# Patient Record
Sex: Female | Born: 1935
Health system: Southern US, Community
[De-identification: ages and names within clinical notes are randomized; demographics above are authoritative.]

## PROBLEM LIST (undated history)

## (undated) DIAGNOSIS — I499 Cardiac arrhythmia, unspecified: Secondary | ICD-10-CM

## (undated) DIAGNOSIS — J309 Allergic rhinitis, unspecified: Secondary | ICD-10-CM

## (undated) DIAGNOSIS — K224 Dyskinesia of esophagus: Secondary | ICD-10-CM

## (undated) DIAGNOSIS — K219 Gastro-esophageal reflux disease without esophagitis: Secondary | ICD-10-CM

## (undated) DIAGNOSIS — R609 Edema, unspecified: Secondary | ICD-10-CM

## (undated) DIAGNOSIS — K635 Polyp of colon: Secondary | ICD-10-CM

## (undated) DIAGNOSIS — K449 Diaphragmatic hernia without obstruction or gangrene: Secondary | ICD-10-CM

## (undated) DIAGNOSIS — M1990 Unspecified osteoarthritis, unspecified site: Secondary | ICD-10-CM

## (undated) DIAGNOSIS — I10 Essential (primary) hypertension: Secondary | ICD-10-CM

## (undated) DIAGNOSIS — I509 Heart failure, unspecified: Secondary | ICD-10-CM

## (undated) HISTORY — DX: Heart failure, unspecified: I50.9

## (undated) HISTORY — PX: HYSTEROTOMY: SHX1776

## (undated) HISTORY — PX: TONSILLECTOMY: SUR1361

## (undated) HISTORY — DX: Unspecified osteoarthritis, unspecified site: M19.90

## (undated) HISTORY — DX: Cardiac arrhythmia, unspecified: I49.9

## (undated) HISTORY — PX: OTHER SURGICAL HISTORY: SHX169

## (undated) HISTORY — PX: APPENDECTOMY: SHX54

## (undated) HISTORY — PX: EYE SURGERY: SHX253

## (undated) HISTORY — PX: OOPHORECTOMY: SHX86

## (undated) HISTORY — PX: CATARACT EXTRACTION: SUR2

## (undated) HISTORY — DX: Essential (primary) hypertension: I10

## (undated) HISTORY — DX: Diaphragmatic hernia without obstruction or gangrene: K44.9

---

## 2004-07-11 ENCOUNTER — Ambulatory Visit: Payer: Self-pay | Admitting: Internal Medicine

## 2004-09-03 ENCOUNTER — Ambulatory Visit: Payer: Self-pay | Admitting: Unknown Physician Specialty

## 2005-04-07 ENCOUNTER — Emergency Department: Payer: Self-pay | Admitting: General Practice

## 2005-07-16 ENCOUNTER — Ambulatory Visit: Payer: Self-pay | Admitting: Internal Medicine

## 2005-10-27 ENCOUNTER — Ambulatory Visit: Payer: Self-pay | Admitting: Unknown Physician Specialty

## 2006-05-13 ENCOUNTER — Ambulatory Visit: Payer: Self-pay | Admitting: Ophthalmology

## 2006-05-20 ENCOUNTER — Ambulatory Visit: Payer: Self-pay | Admitting: Ophthalmology

## 2006-07-15 ENCOUNTER — Ambulatory Visit: Payer: Self-pay | Admitting: General Practice

## 2006-08-04 ENCOUNTER — Ambulatory Visit: Payer: Self-pay | Admitting: Internal Medicine

## 2006-08-12 ENCOUNTER — Ambulatory Visit: Payer: Self-pay | Admitting: General Practice

## 2006-08-26 ENCOUNTER — Ambulatory Visit: Payer: Self-pay | Admitting: General Practice

## 2007-02-10 ENCOUNTER — Ambulatory Visit: Payer: Self-pay | Admitting: Ophthalmology

## 2007-02-17 ENCOUNTER — Ambulatory Visit: Payer: Self-pay | Admitting: Ophthalmology

## 2007-09-01 ENCOUNTER — Ambulatory Visit: Payer: Self-pay | Admitting: Internal Medicine

## 2008-09-05 ENCOUNTER — Ambulatory Visit: Payer: Self-pay | Admitting: Internal Medicine

## 2009-01-22 ENCOUNTER — Inpatient Hospital Stay: Payer: Self-pay | Admitting: Internal Medicine

## 2009-03-05 ENCOUNTER — Ambulatory Visit: Payer: Self-pay | Admitting: Internal Medicine

## 2009-09-10 ENCOUNTER — Ambulatory Visit: Payer: Self-pay | Admitting: Internal Medicine

## 2010-03-06 ENCOUNTER — Ambulatory Visit: Payer: Self-pay | Admitting: Unknown Physician Specialty

## 2010-09-16 ENCOUNTER — Ambulatory Visit: Payer: Self-pay | Admitting: Internal Medicine

## 2010-12-18 ENCOUNTER — Ambulatory Visit: Payer: Self-pay | Admitting: Unknown Physician Specialty

## 2011-10-22 ENCOUNTER — Ambulatory Visit: Payer: Self-pay | Admitting: Internal Medicine

## 2012-10-25 ENCOUNTER — Ambulatory Visit: Payer: Self-pay | Admitting: Internal Medicine

## 2013-02-20 ENCOUNTER — Emergency Department: Payer: Self-pay | Admitting: Emergency Medicine

## 2013-02-20 LAB — COMPREHENSIVE METABOLIC PANEL
Albumin: 4 g/dL (ref 3.4–5.0)
Alkaline Phosphatase: 103 U/L (ref 50–136)
BUN: 25 mg/dL — ABNORMAL HIGH (ref 7–18)
Bilirubin,Total: 0.2 mg/dL (ref 0.2–1.0)
Calcium, Total: 9.7 mg/dL (ref 8.5–10.1)
Chloride: 106 mmol/L (ref 98–107)
Co2: 23 mmol/L (ref 21–32)
EGFR (African American): 51 — ABNORMAL LOW
EGFR (Non-African Amer.): 44 — ABNORMAL LOW
Glucose: 286 mg/dL — ABNORMAL HIGH (ref 65–99)
Osmolality: 290 (ref 275–301)
SGOT(AST): 20 U/L (ref 15–37)
SGPT (ALT): 20 U/L (ref 12–78)
Total Protein: 7.6 g/dL (ref 6.4–8.2)

## 2013-02-20 LAB — CBC
HCT: 43.4 % (ref 35.0–47.0)
HGB: 14.5 g/dL (ref 12.0–16.0)
MCH: 30.5 pg (ref 26.0–34.0)
MCV: 91 fL (ref 80–100)
Platelet: 244 10*3/uL (ref 150–440)
WBC: 7.2 10*3/uL (ref 3.6–11.0)

## 2013-02-20 LAB — TROPONIN I: Troponin-I: 0.02 ng/mL

## 2013-02-20 LAB — URINALYSIS, COMPLETE
Bacteria: NONE SEEN
Ketone: NEGATIVE
Ph: 5 (ref 4.5–8.0)
RBC,UR: 1 /HPF (ref 0–5)
Squamous Epithelial: <1
WBC UR: <1 /HPF (ref 0–5)

## 2013-02-20 LAB — CK TOTAL AND CKMB (NOT AT ARMC): CK, Total: 55 U/L (ref 21–215)

## 2013-03-10 ENCOUNTER — Ambulatory Visit: Payer: Self-pay | Admitting: Internal Medicine

## 2014-01-08 DIAGNOSIS — I10 Essential (primary) hypertension: Secondary | ICD-10-CM | POA: Insufficient documentation

## 2014-09-24 ENCOUNTER — Inpatient Hospital Stay: Payer: Self-pay | Admitting: Family Medicine

## 2014-09-24 LAB — COMPREHENSIVE METABOLIC PANEL
ANION GAP: 9 (ref 7–16)
Albumin: 2.7 g/dL — ABNORMAL LOW (ref 3.4–5.0)
Alkaline Phosphatase: 70 U/L
BUN: 16 mg/dL (ref 7–18)
Bilirubin,Total: 0.4 mg/dL (ref 0.2–1.0)
CALCIUM: 8.6 mg/dL (ref 8.5–10.1)
Chloride: 104 mmol/L (ref 98–107)
Co2: 29 mmol/L (ref 21–32)
Creatinine: 0.95 mg/dL (ref 0.60–1.30)
EGFR (Non-African Amer.): 59 — ABNORMAL LOW
GLUCOSE: 127 mg/dL — AB (ref 65–99)
Osmolality: 286 (ref 275–301)
Potassium: 3 mmol/L — ABNORMAL LOW (ref 3.5–5.1)
SGOT(AST): 20 U/L (ref 15–37)
SGPT (ALT): 16 U/L
Sodium: 142 mmol/L (ref 136–145)
TOTAL PROTEIN: 6.6 g/dL (ref 6.4–8.2)

## 2014-09-24 LAB — CBC
HCT: 38.9 % (ref 35.0–47.0)
HGB: 12.5 g/dL (ref 12.0–16.0)
MCH: 30.4 pg (ref 26.0–34.0)
MCHC: 32.2 g/dL (ref 32.0–36.0)
MCV: 95 fL (ref 80–100)
Platelet: 225 10*3/uL (ref 150–440)
RBC: 4.11 10*6/uL (ref 3.80–5.20)
RDW: 15.2 % — ABNORMAL HIGH (ref 11.5–14.5)
WBC: 10.7 10*3/uL (ref 3.6–11.0)

## 2014-09-24 LAB — TROPONIN I: Troponin-I: 0.06 ng/mL — ABNORMAL HIGH

## 2014-09-24 LAB — PRO B NATRIURETIC PEPTIDE: B-TYPE NATIURETIC PEPTID: 637 pg/mL — AB (ref 0–450)

## 2014-09-25 LAB — CBC WITH DIFFERENTIAL/PLATELET
BASOS PCT: 0 %
Basophil #: 0 10*3/uL (ref 0.0–0.1)
Eosinophil #: 0 10*3/uL (ref 0.0–0.7)
Eosinophil %: 0 %
HCT: 35.4 % (ref 35.0–47.0)
HGB: 11.9 g/dL — ABNORMAL LOW (ref 12.0–16.0)
LYMPHS PCT: 6.7 %
Lymphocyte #: 0.6 10*3/uL — ABNORMAL LOW (ref 1.0–3.6)
MCH: 31 pg (ref 26.0–34.0)
MCHC: 33.5 g/dL (ref 32.0–36.0)
MCV: 93 fL (ref 80–100)
MONOS PCT: 2.7 %
Monocyte #: 0.2 x10 3/mm (ref 0.2–0.9)
NEUTROS PCT: 90.6 %
Neutrophil #: 7.7 10*3/uL — ABNORMAL HIGH (ref 1.4–6.5)
Platelet: 216 10*3/uL (ref 150–440)
RBC: 3.83 10*6/uL (ref 3.80–5.20)
RDW: 15 % — ABNORMAL HIGH (ref 11.5–14.5)
WBC: 8.5 10*3/uL (ref 3.6–11.0)

## 2014-09-25 LAB — BASIC METABOLIC PANEL
Anion Gap: 8 (ref 7–16)
BUN: 17 mg/dL (ref 7–18)
CALCIUM: 8.3 mg/dL — AB (ref 8.5–10.1)
Chloride: 110 mmol/L — ABNORMAL HIGH (ref 98–107)
Co2: 26 mmol/L (ref 21–32)
Creatinine: 0.86 mg/dL (ref 0.60–1.30)
EGFR (African American): >60
GLUCOSE: 174 mg/dL — AB (ref 65–99)
Osmolality: 293 (ref 275–301)
Potassium: 3.4 mmol/L — ABNORMAL LOW (ref 3.5–5.1)
Sodium: 144 mmol/L (ref 136–145)

## 2014-09-26 LAB — CBC WITH DIFFERENTIAL/PLATELET
BASOS ABS: 0 10*3/uL (ref 0.0–0.1)
BASOS PCT: 0.1 %
EOS ABS: 0 10*3/uL (ref 0.0–0.7)
EOS PCT: 0 %
HCT: 36.9 % (ref 35.0–47.0)
HGB: 11.9 g/dL — ABNORMAL LOW (ref 12.0–16.0)
LYMPHS ABS: 0.9 10*3/uL — AB (ref 1.0–3.6)
Lymphocyte %: 4.7 %
MCH: 30.5 pg (ref 26.0–34.0)
MCHC: 32.2 g/dL (ref 32.0–36.0)
MCV: 95 fL (ref 80–100)
MONO ABS: 0.7 x10 3/mm (ref 0.2–0.9)
MONOS PCT: 3.6 %
Neutrophil #: 17.8 10*3/uL — ABNORMAL HIGH (ref 1.4–6.5)
Neutrophil %: 91.6 %
Platelet: 243 10*3/uL (ref 150–440)
RBC: 3.9 10*6/uL (ref 3.80–5.20)
RDW: 15 % — ABNORMAL HIGH (ref 11.5–14.5)
WBC: 19.4 10*3/uL — ABNORMAL HIGH (ref 3.6–11.0)

## 2014-09-26 LAB — BASIC METABOLIC PANEL
ANION GAP: 6 — AB (ref 7–16)
BUN: 24 mg/dL — ABNORMAL HIGH (ref 7–18)
CHLORIDE: 113 mmol/L — AB (ref 98–107)
CREATININE: 0.88 mg/dL (ref 0.60–1.30)
Calcium, Total: 9.1 mg/dL (ref 8.5–10.1)
Co2: 26 mmol/L (ref 21–32)
Glucose: 159 mg/dL — ABNORMAL HIGH (ref 65–99)
Osmolality: 296 (ref 275–301)
Potassium: 5 mmol/L (ref 3.5–5.1)
SODIUM: 145 mmol/L (ref 136–145)

## 2014-09-27 LAB — CBC WITH DIFFERENTIAL/PLATELET
BASOS ABS: 0 10*3/uL (ref 0.0–0.1)
Basophil %: 0.1 %
EOS PCT: 0 %
Eosinophil #: 0 10*3/uL (ref 0.0–0.7)
HCT: 35.4 % (ref 35.0–47.0)
HGB: 11.3 g/dL — AB (ref 12.0–16.0)
Lymphocyte #: 1 10*3/uL (ref 1.0–3.6)
Lymphocyte %: 6.4 %
MCH: 30.2 pg (ref 26.0–34.0)
MCHC: 32.1 g/dL (ref 32.0–36.0)
MCV: 94 fL (ref 80–100)
Monocyte #: 1.2 x10 3/mm — ABNORMAL HIGH (ref 0.2–0.9)
Monocyte %: 7.8 %
NEUTROS ABS: 13.3 10*3/uL — AB (ref 1.4–6.5)
Neutrophil %: 85.7 %
Platelet: 261 10*3/uL (ref 150–440)
RBC: 3.75 10*6/uL — ABNORMAL LOW (ref 3.80–5.20)
RDW: 15.1 % — ABNORMAL HIGH (ref 11.5–14.5)
WBC: 15.6 10*3/uL — ABNORMAL HIGH (ref 3.6–11.0)

## 2014-09-27 LAB — BASIC METABOLIC PANEL
ANION GAP: 6 — AB (ref 7–16)
BUN: 23 mg/dL — AB (ref 7–18)
CHLORIDE: 112 mmol/L — AB (ref 98–107)
CREATININE: 0.79 mg/dL (ref 0.60–1.30)
Calcium, Total: 8.4 mg/dL — ABNORMAL LOW (ref 8.5–10.1)
Co2: 26 mmol/L (ref 21–32)
EGFR (African American): >60
GLUCOSE: 104 mg/dL — AB (ref 65–99)
OSMOLALITY: 291 (ref 275–301)
Potassium: 4.1 mmol/L (ref 3.5–5.1)
Sodium: 144 mmol/L (ref 136–145)

## 2014-09-29 LAB — CULTURE, BLOOD (SINGLE)

## 2014-10-10 DIAGNOSIS — R5383 Other fatigue: Secondary | ICD-10-CM | POA: Diagnosis not present

## 2014-10-10 DIAGNOSIS — J189 Pneumonia, unspecified organism: Secondary | ICD-10-CM | POA: Diagnosis not present

## 2014-10-10 DIAGNOSIS — J40 Bronchitis, not specified as acute or chronic: Secondary | ICD-10-CM | POA: Diagnosis not present

## 2014-10-26 DIAGNOSIS — K2900 Acute gastritis without bleeding: Secondary | ICD-10-CM | POA: Diagnosis not present

## 2014-10-26 DIAGNOSIS — J189 Pneumonia, unspecified organism: Secondary | ICD-10-CM | POA: Diagnosis not present

## 2014-10-26 DIAGNOSIS — G4701 Insomnia due to medical condition: Secondary | ICD-10-CM | POA: Diagnosis not present

## 2014-11-21 DIAGNOSIS — N3000 Acute cystitis without hematuria: Secondary | ICD-10-CM | POA: Diagnosis not present

## 2014-11-21 DIAGNOSIS — J9621 Acute and chronic respiratory failure with hypoxia: Secondary | ICD-10-CM | POA: Diagnosis not present

## 2014-11-21 DIAGNOSIS — J40 Bronchitis, not specified as acute or chronic: Secondary | ICD-10-CM | POA: Diagnosis not present

## 2014-11-25 ENCOUNTER — Inpatient Hospital Stay: Payer: Self-pay | Admitting: Internal Medicine

## 2014-11-25 DIAGNOSIS — R918 Other nonspecific abnormal finding of lung field: Secondary | ICD-10-CM | POA: Diagnosis not present

## 2014-11-25 DIAGNOSIS — R0902 Hypoxemia: Secondary | ICD-10-CM | POA: Diagnosis not present

## 2014-11-25 DIAGNOSIS — F329 Major depressive disorder, single episode, unspecified: Secondary | ICD-10-CM | POA: Diagnosis not present

## 2014-11-25 DIAGNOSIS — Z8249 Family history of ischemic heart disease and other diseases of the circulatory system: Secondary | ICD-10-CM | POA: Diagnosis not present

## 2014-11-25 DIAGNOSIS — J180 Bronchopneumonia, unspecified organism: Secondary | ICD-10-CM | POA: Diagnosis not present

## 2014-11-25 DIAGNOSIS — R0602 Shortness of breath: Secondary | ICD-10-CM | POA: Diagnosis not present

## 2014-11-25 DIAGNOSIS — K219 Gastro-esophageal reflux disease without esophagitis: Secondary | ICD-10-CM | POA: Diagnosis not present

## 2014-11-25 DIAGNOSIS — R110 Nausea: Secondary | ICD-10-CM | POA: Diagnosis not present

## 2014-11-25 DIAGNOSIS — E876 Hypokalemia: Secondary | ICD-10-CM | POA: Diagnosis not present

## 2014-11-25 DIAGNOSIS — R531 Weakness: Secondary | ICD-10-CM | POA: Diagnosis not present

## 2014-11-25 DIAGNOSIS — Z79899 Other long term (current) drug therapy: Secondary | ICD-10-CM | POA: Diagnosis not present

## 2014-11-25 DIAGNOSIS — J189 Pneumonia, unspecified organism: Secondary | ICD-10-CM | POA: Diagnosis not present

## 2014-11-25 DIAGNOSIS — I10 Essential (primary) hypertension: Secondary | ICD-10-CM | POA: Diagnosis not present

## 2014-11-25 DIAGNOSIS — R600 Localized edema: Secondary | ICD-10-CM | POA: Diagnosis not present

## 2014-12-07 DIAGNOSIS — J0111 Acute recurrent frontal sinusitis: Secondary | ICD-10-CM | POA: Diagnosis not present

## 2014-12-07 DIAGNOSIS — J4522 Mild intermittent asthma with status asthmaticus: Secondary | ICD-10-CM | POA: Diagnosis not present

## 2014-12-07 DIAGNOSIS — J45909 Unspecified asthma, uncomplicated: Secondary | ICD-10-CM | POA: Diagnosis not present

## 2014-12-07 DIAGNOSIS — J159 Unspecified bacterial pneumonia: Secondary | ICD-10-CM | POA: Diagnosis not present

## 2014-12-18 DIAGNOSIS — K210 Gastro-esophageal reflux disease with esophagitis: Secondary | ICD-10-CM | POA: Diagnosis not present

## 2014-12-18 DIAGNOSIS — R1311 Dysphagia, oral phase: Secondary | ICD-10-CM | POA: Diagnosis not present

## 2014-12-27 DIAGNOSIS — J4522 Mild intermittent asthma with status asthmaticus: Secondary | ICD-10-CM | POA: Diagnosis not present

## 2014-12-27 DIAGNOSIS — J0111 Acute recurrent frontal sinusitis: Secondary | ICD-10-CM | POA: Diagnosis not present

## 2014-12-27 DIAGNOSIS — J159 Unspecified bacterial pneumonia: Secondary | ICD-10-CM | POA: Diagnosis not present

## 2014-12-29 DIAGNOSIS — R1314 Dysphagia, pharyngoesophageal phase: Secondary | ICD-10-CM | POA: Diagnosis not present

## 2014-12-29 DIAGNOSIS — K210 Gastro-esophageal reflux disease with esophagitis: Secondary | ICD-10-CM | POA: Diagnosis not present

## 2015-01-09 DIAGNOSIS — R1310 Dysphagia, unspecified: Secondary | ICD-10-CM | POA: Diagnosis not present

## 2015-01-16 DIAGNOSIS — N644 Mastodynia: Secondary | ICD-10-CM | POA: Diagnosis not present

## 2015-01-16 DIAGNOSIS — R5382 Chronic fatigue, unspecified: Secondary | ICD-10-CM | POA: Diagnosis not present

## 2015-01-16 DIAGNOSIS — B3781 Candidal esophagitis: Secondary | ICD-10-CM | POA: Diagnosis not present

## 2015-01-23 ENCOUNTER — Emergency Department
Admit: 2015-01-23 | Disposition: A | Discharge status: Home / Self Care | Payer: Self-pay | Admitting: Emergency Medicine

## 2015-01-23 DIAGNOSIS — R509 Fever, unspecified: Secondary | ICD-10-CM | POA: Diagnosis not present

## 2015-01-23 DIAGNOSIS — I10 Essential (primary) hypertension: Secondary | ICD-10-CM | POA: Diagnosis not present

## 2015-01-23 DIAGNOSIS — J209 Acute bronchitis, unspecified: Secondary | ICD-10-CM | POA: Diagnosis not present

## 2015-01-23 DIAGNOSIS — J45901 Unspecified asthma with (acute) exacerbation: Secondary | ICD-10-CM | POA: Diagnosis not present

## 2015-01-23 DIAGNOSIS — Z880 Allergy status to penicillin: Secondary | ICD-10-CM | POA: Diagnosis not present

## 2015-01-23 DIAGNOSIS — R5383 Other fatigue: Secondary | ICD-10-CM | POA: Diagnosis not present

## 2015-01-24 ENCOUNTER — Other Ambulatory Visit: Payer: Self-pay | Admitting: Internal Medicine

## 2015-01-24 DIAGNOSIS — N6320 Unspecified lump in the left breast, unspecified quadrant: Secondary | ICD-10-CM

## 2015-01-24 NOTE — H&P (Signed)
PATIENT NAMERENEISHA, Alvarez MR#:  [id number] DATE OF BIRTH:  1926-10-03  DATE OF ADMISSION:  09/24/2014  PRIMARY DOCTOR: Rusty Aus,  MD  EMERGENCY ROOM PHYSICIAN: Dr. Wallace Going.   CHIEF COMPLAINT: Trouble breathing and cough.   HISTORY OF PRESENT ILLNESS: An 81 year old female patient with hypertension, GERD, comes in because of persistent cough and yellow phlegm for about 5 days. The patient has been having a cough and yellow phlegm since Tuesday of this week and the patient was called in for Levaquin from Dr. Myrtis Ser office on December 23. The patient took Levaquin on the 24th, the 25th, the 26th and the 27th; four doses, but she feels more short of breath, more cough with yellow phlegm and she does not feel good so she came in here for further evaluation. The patient also feels very weak and tired with generalized body pains. The patient's chest x-ray showed opacities in the right upper lobe concerning for acute on chronic infection. The patient has no chest pain, denies any exertional dyspnea and no pedal edema.   ALLERGIES: PENICILLIN AUGMENTIN AND SULFA. SHE IS ALSO ALLERGIC TO A LOT OF FOODS, ESPECIALLY STRAWBERRIES, PINEAPPLE, TOMATOES, SWEET POTATOES, BANANAS AND SQUASH.   PAST SURGICAL HISTORY: Significant for tonsillectomy, appendectomy, complete hysterectomy.   FAMILY HISTORY: Hypertension in the father. No diabetes.   SOCIAL HISTORY: No smoking, no drinking. Lives alone.   MEDICATIONS: Celexa 20 mg p.o. daily, Cardizem CD 240 mg p.o. daily, omeprazole 20 mg p.o. daily, hydrochlorothiazide/triamterene 20/37.5 mg p.o. daily, potassium chloride 20 mEq p.o. daily.   REVIEW OF SYSTEMS: CONSTITUTIONAL: Feels fatigued and weak.   EYES: No blurred vision.   ENT: No tinnitus. No ear pain. No epistaxis. No difficulty swallowing.   RESPIRATORY: Does have cough and yellow phlegm for about 5 days.   CARDIOVASCULAR: No chest pain. No orthopnea. No PND. No palpitations.    GASTROINTESTINAL: Complains of nausea and vomited 1 time last week. No abdominal pain.   GENITOURINARY: No dysuria or hematuria.   ENDOCRINE: No polyphagia or nocturia.   HEMATOLOGIC: No anemia.   INTEGUMENTARY: No skin rash.   MUSCULOSKELETAL: No joint pain.   NEUROLOGIC: No numbness or weakness.   PSYCHIATRIC: No anxiety or insomnia.   PHYSICAL EXAMINATION: VITAL SIGNS: Temperature 99.1, heart rate 93, blood pressure 142/66, saturations 94% on room air.   GENERAL: She is alert, awake, oriented, 81 year old female patient seen in the ER, answers questions appropriately. Not in distress. The patient right now is on 1 liter and saturations are about 100%.    HEAD: Normocephalic, atraumatic.   EYES: Pupils equal, reacting to light. No conjunctival pallor. No scleral icterus. Extraocular movements intact.   NOSE: No nasal lesions. No drainage.   EARS: No drainage or external lesions.   MOUTH: No lesions. No exudates.   NECK: Supple. No JVD. No carotid bruit. Thyroid in the midline.   RESPIRATORY: Good respiratory effort. Clear to auscultation. No rales. The patient does have mild expiratory wheeze in all lung fields.   CARDIOVASCULAR: S1, S2 regular. No murmurs. No gallops. The patient's PMI not displaced. Pulses are equal at carotids, femoral and dorsalis pedis arteries. No peripheral edema.   GASTROINTESTINAL: Abdomen is soft, nontender, nondistended. Bowel sounds present. No organomegaly.   MUSCULOSKELETAL: The patient's gait is normal. No pathology as far as digits or the nails. Extremities move x 4.   SKIN: Inspection is normal.   VASCULAR: Good pedal pulses.   NEUROLOGIC: Cranial nerves 2 through 12  are intact.power 5/5 upper and lower extremitites.sensations are  intact.dtr 2+bilaterally   PSYCHIATRIC: Motor and affect are within normal limits.   LABORATORY DATA: WBC 10.7, hemoglobin 12.5, hematocrit 38.9, platelets 225,000. Troponin 0.06. Electrolytes:  Sodium 142, potassium 3, chloride 10 BUN 16, creatinine 0.95, glucose 127.   DIAGNOSTIC DATA: Chest x-ray: Opacities in the right upper lobe concerning for acute on chronic disease. EKG: Normal sinus rhythm with no ST-T changes.   ASSESSMENT AND PLAN: 1. An 81 year old female patient with right upper lobe pneumonia with persistent cough, failed outpatient therapy. Continue admission to medical service, start her on intravenous Levaquin. The patient already received 1 dose of Levaquin this morning, so continue Levaquin from tomorrow, continue intravenous Solu-Medrol 40 mg every 8 hours, continue nebulizers albuterol with Atrovent every 4 hours.  2. Cough with sputum. Check sputum cultures.  3. Hypertension. Blood pressure is controlled. Continue the Cardizem. Hold hydrochlorothiazide with Maxzide because she looks dehydrated at this time.  4. Hypokalemia. Replace the potassium. She already received 20 mEq potassium in the emergency room.  5. Depression. Continue Celexa.  6. Deep vein thrombosis prophylaxis with Lovenox 40 mg subcutaneous daily.  7. The patient is signed out to Dr. Emily Filbert.   TIME SPENT: 55 minutes.    ____________________________ Epifanio Lesches, MD sk:TT D: 09/24/2014 14:10:58 ET T: 09/24/2014 14:24:09 ET JOB#: [id number]  cc: Epifanio Lesches, MD, <Dictator> Epifanio Lesches MD ELECTRONICALLY SIGNED 10/30/2014 8:45

## 2015-01-24 NOTE — Discharge Summary (Signed)
PATIENT NAMEKEIRA, [name] MR#:  [id number] DATE OF BIRTH:  [birth year]  DATE OF ADMISSION:  09/24/2014 DATE OF DISCHARGE:  09/27/2014   DISCHARGE DIAGNOSES:  Acute right upper lobe pneumonia.   MEDICATIONS:  1.  Diltiazem 240 mg p.o. daily.  2.  Potassium chloride 20 mEq 1/2 tab p.o. daily.  3.  Hydrochlorothiazide/triamterene 25/37.5 mg daily.  4.  Citalopram 20 mg p.o. daily.  5.  Claritin 10 mg p.o. daily.  6.  Lidoderm 5% topical film applied to the affected area once a day.  7.  Levofloxacin 750 mg p.o. daily x 4 more days.   CONSULTATIONS: None.   PROCEDURES: None.   PERTINENT LABORATORIES AND STUDIES: On the day of discharge, sodium 144, potassium 4.1, creatinine 0.79, glucose 104. White blood cell count of 15.6, hemoglobin 11.3, and platelets 261,000. Chest x-ray consistent with right upper lobe opacity.   BRIEF HOSPITAL COURSE: Right upper lobe pneumonia. The patient initially came in with worsening shortness of breath and cough, consistent with a right upper lobe pneumonia. She failed outpatient treatment and was given IV Levaquin here and initial steroids, which were discontinued. Her breathing did improve clinically. She will continue high-dose oral levofloxacin 750 mg p.o. daily x 4 more days and will follow up with Dr. Sabra Heck as an outpatient. She was evaluated by physical therapy, which is still pending at this time. She has a rolling walker home, which I advised her to continue. Otherwise, continue her home regimen.   DISPOSITION: She is in stable condition and will be discharged to home.   FOLLOWUP: Follow up with Dr. Sabra Heck in 1-2 weeks.    ____________________________ Dion Body, MD kl:MT D: 09/27/2014 09:04:30 ET T: 09/27/2014 13:52:24 ET JOB#: [id number]  cc: Dion Body, MD, <Dictator> Dion Body MD ELECTRONICALLY SIGNED 10/12/2014 12:02

## 2015-01-26 DIAGNOSIS — J4522 Mild intermittent asthma with status asthmaticus: Secondary | ICD-10-CM | POA: Diagnosis not present

## 2015-01-26 DIAGNOSIS — J40 Bronchitis, not specified as acute or chronic: Secondary | ICD-10-CM | POA: Diagnosis not present

## 2015-01-26 DIAGNOSIS — J0110 Acute frontal sinusitis, unspecified: Secondary | ICD-10-CM | POA: Diagnosis not present

## 2015-01-28 NOTE — H&P (Signed)
PATIENT NAMEANNETA, [name] MR#:  [id number] DATE OF BIRTH:  [birth year]  DATE OF ADMISSION:  11/25/2014   PRIMARY CARE PROVIDER: Emily Filbert, MD  EMERGENCY DEPARTMENT REFERRING PHYSICIAN: Delman Kitten, MD   CHIEF COMPLAINT: Shortness of breath, cough, weakness.   HISTORY OF PRESENT ILLNESS: The patient is an 81 year old white female who was recently hospitalized in December for shortness of breath and pneumonia.  She was in the hospital for 3 days.  At that time, she was treated for right upper lobe pneumonia.  On discharge home, the patient was feeling better and then she started feeling weak again and started having cough, shortness of breath and was recently started on again antibiotics for bronchitis-pneumonia with Levaquin and doxycycline since last Tuesday.  The patient is still having cough, shortness of breath, comes to the ED.  This time, she is noted to have a left upper lobe pneumonia. The patient also is complaining of feeling very weak and tired with dyspnea on exertion, but has not had any chest pain or abdominal pain, has had some fevers and chills.  She reports that she gets sweaty after taking the Levaquin.   ALLERGIES: AUGMENTIN, PENICILLIN AND SULFA.  ALSO ALLERGIC TO A LOT OF FOODS, STRAWBERRIES, PINEAPPLE, TOMATOES, SWEET POTATOES, BANANAS AND SQUASH.   PAST MEDICAL HISTORY:  Hypertension, GERD, recent diagnosis of pneumonia.   PAST SURGICAL HISTORY: Significant for tonsillectomy, appendectomy, complete hysterectomy.   FAMILY HISTORY: Hypertension in father. No diabetes.   SOCIAL HISTORY: No smoking. No drinking. Lives alone.   MEDICATIONS: Ranitidine 150 b.i.d., potassium chloride 20 mg 1-1/2 tablets daily, levofloxacin 500 mg 1 tab p.o. every 24, hydrochlorothiazide triamterene 25/37.5 mg 1 tab p.o. daily, fluticasone nasal spray, doxycycline 75 mg every 12 hours, diltiazem HCL 240 daily, Claritin 10 daily, citalopram 20 daily.  REVIEW OF SYSTEMS:  CONSTITUTIONAL:  Complains of feeling fatigued and weak.  EYES: No blurred vision or double vision. No redness.  EARS, NOSE, AND THROAT: No tinnitus. No ear pain. No hearing loss.  RESPIRATORY: Complains of productive yellowish cough. For the past few days, shortness of breath, but no wheezing, no hemoptysis. No diagnosis of COPD.  CARDIOVASCULAR: Denies any chest pain, orthopnea, or edema.  GASTROINTESTINAL: Complains of some nausea, no vomiting. No abdominal pain. No hematemesis or hematochezia.  GENITOURINARY: Denies any frequency, urgency or hesitancy, dysuria, hematuria.  ENDOCRINE: No polyphagia, nocturia. No thyroid problems.  HEMATOLOGIC AND LYMPHATICS: No anemia, easy bruisability or bleeding.  SKIN: No rash.  MUSCULOSKELETAL: No joint pain. No gout.  NEUROLOGICAL: No CVA, TIA, seizures.  PSYCHIATRIC: No anxiety, insomnia, or ADD.   PHYSICAL EXAMINATION:  VITAL SIGNS: Temperature 98.8, pulse 60 to 82, respirations 19, blood pressure 160/73, temperature 93%.  GENERAL: The patient is an elderly female, well-developed, appears weak.  HEENT: Head atraumatic, normocephalic. Pupils equally round, reactive to light and accommodation. There is no conjunctival pallor.  No sclerae icterus. Nasal exam shows no drainage or ulceration in the ears. There is no drainage or external lesions. Mouth no exudate. No lesions.  NECK: Supple. No JVD. No carotid bruits. Thyroid is midline.  RESPIRATORY: Good respiratory effort. Clear to auscultation. She does have some rhonchi in the left upper lobe.  CARDIOVASCULAR: S1, S2 positive. No murmurs, rubs, clicks, or gallops. PMI is not displaced.  ABDOMEN: Soft, nontender, nondistended. Positive bowel sounds x 4. No hepatosplenomegaly.  EXTREMITIES: No clubbing, cyanosis, or edema.  SKIN: No rash.  LYMPH NODES: Nonpalpable.  MUSCULOSKELETAL: There is no erythema or  swelling. PSYCHIATRIC: Not anxious or depressed.  NEUROLOGIC:  Cranial nerves II through XII grossly intact. No  focal deficits.   LABORATORY DATA:  PA and lateral chest x-ray shows left upper lobe and lingual infiltrate. Influenza A and B negative.  Urinalysis negative.  CPK 26, CK-MB 1.1. WBC 9.7, hemoglobin 13, platelet count 248,000, glucose 103, BUN 16, creatinine 0.82, sodium 144, potassium 3.1, chloride 107, CO2 of 27.    ASSESSMENT AND PLAN: The patient is an 81 year old with recent hospitalization in December for right upper lobe pneumonia now with cough, weakness, has been on antibiotics, failed outpatient therapy and now has left upper lobe pneumonia.  1.  Left upper lobe pneumonia.  At this time I will treat with IV ceftriaxone, azithromycin and place her on Mucinex.  We will do sputum cultures. We will do swallow evaluation, immunoglobulin levels.  2.  Hypertension. Continue Cardizem and hydrochlorothiazide and triamterene.  3.  Hypokalemia.  I will replace her potassium.  4.  Depression. Continue Celexa. 5.  Gastroesophageal reflux disease.  She will be on Zantac.  6.  Miscellaneous. She will be on Lovenox for deep vein thrombosis prophylaxis.    TIME SPENT ON THIS H AND P:  50 minutes.      ___________________________ Lafonda Mosses Posey Pronto, MD shp:DT D: 11/25/2014 12:52:51 ET T: 11/25/2014 13:51:09 ET JOB#: [id number]  cc: Smrithi Pigford H. Posey Pronto, MD, <Dictator> Alric Seton MD ELECTRONICALLY SIGNED 12/01/2014 15:20

## 2015-01-28 NOTE — Discharge Summary (Signed)
PATIENT NAMEADALYNNE, [name] MR#:  [id number] DATE OF BIRTH:  06-Oct-1926  DATE OF ADMISSION:  11/25/2014 DATE OF DISCHARGE:  11/28/2014  DISCHARGE DIAGNOSES:  1.  Left upper lobe pneumonia with hypoxemia, community-acquired.  2.  Hypertension.  3.  Gastroesophageal reflux disease.   DISCHARGE MEDICATIONS:  1.  Potassium chloride 20 mEq 1-1/2 tabs daily. 2.  Maxzide 25 daily. 3.  Citalopram 20 mg daily. 4.  Claritin 10 mg daily. 5.  Diltiazem ER 240 mg daily. 6.  Ranitidine 150 mg b.i.d. 7.  Flonase 2 sprays daily. 8.  Ceftin 250 mg b.i.d. x 10 days.   REASON FOR ADMISSION: The patient is an 81 year old female who presents with a left upper lobe pneumonia and hypoxemia. Please see history and physical for history of present illness, past medical history, and physical exam.   HOSPITAL COURSE: The patient was admitted, found to have a left upper lobe pneumonia. She was treated with IV azithromycin and Rocephin with dramatic improvement in her symptoms. Her wheezing resolved. She still has a bit of a cough, otherwise has some minimal dyspnea on exertion. Speech therapy found no signs of aspiration. She has a lot of food allergies which are questionable and she will start to address those. She will follow up with Dr. Sabra Heck in 1 week, chest x-ray at that time.    ____________________________ Rusty Aus, MD mfm:mc D: 11/28/2014 07:55:38 ET T: 11/28/2014 09:27:05 ET JOB#: [id number]  cc: Rusty Aus, MD, <Dictator> MARK Roselee Culver MD ELECTRONICALLY SIGNED 11/29/2014 7:59

## 2015-02-01 DIAGNOSIS — J40 Bronchitis, not specified as acute or chronic: Secondary | ICD-10-CM | POA: Diagnosis not present

## 2015-02-01 DIAGNOSIS — J4522 Mild intermittent asthma with status asthmaticus: Secondary | ICD-10-CM | POA: Diagnosis not present

## 2015-02-02 ENCOUNTER — Ambulatory Visit: Admission: RE | Admit: 2015-02-02 | Payer: Self-pay | Source: Ambulatory Visit

## 2015-02-02 ENCOUNTER — Other Ambulatory Visit: Payer: Self-pay

## 2015-03-05 DIAGNOSIS — I10 Essential (primary) hypertension: Secondary | ICD-10-CM | POA: Diagnosis not present

## 2015-03-05 DIAGNOSIS — Z79899 Other long term (current) drug therapy: Secondary | ICD-10-CM | POA: Diagnosis not present

## 2015-03-05 DIAGNOSIS — R609 Edema, unspecified: Secondary | ICD-10-CM | POA: Diagnosis not present

## 2015-03-05 DIAGNOSIS — K210 Gastro-esophageal reflux disease with esophagitis: Secondary | ICD-10-CM | POA: Diagnosis not present

## 2015-03-26 DIAGNOSIS — Z961 Presence of intraocular lens: Secondary | ICD-10-CM | POA: Diagnosis not present

## 2015-05-10 DIAGNOSIS — Z85828 Personal history of other malignant neoplasm of skin: Secondary | ICD-10-CM | POA: Diagnosis not present

## 2015-05-10 DIAGNOSIS — D1800 Hemangioma unspecified site: Secondary | ICD-10-CM | POA: Diagnosis not present

## 2015-05-10 DIAGNOSIS — L814 Other melanin hyperpigmentation: Secondary | ICD-10-CM | POA: Diagnosis not present

## 2015-05-10 DIAGNOSIS — L578 Other skin changes due to chronic exposure to nonionizing radiation: Secondary | ICD-10-CM | POA: Diagnosis not present

## 2015-05-10 DIAGNOSIS — L821 Other seborrheic keratosis: Secondary | ICD-10-CM | POA: Diagnosis not present

## 2015-05-10 DIAGNOSIS — L820 Inflamed seborrheic keratosis: Secondary | ICD-10-CM | POA: Diagnosis not present

## 2015-05-10 DIAGNOSIS — Z1283 Encounter for screening for malignant neoplasm of skin: Secondary | ICD-10-CM | POA: Diagnosis not present

## 2015-05-10 DIAGNOSIS — D229 Melanocytic nevi, unspecified: Secondary | ICD-10-CM | POA: Diagnosis not present

## 2015-07-23 DIAGNOSIS — J40 Bronchitis, not specified as acute or chronic: Secondary | ICD-10-CM | POA: Diagnosis not present

## 2015-07-23 DIAGNOSIS — J4522 Mild intermittent asthma with status asthmaticus: Secondary | ICD-10-CM | POA: Diagnosis not present

## 2015-08-21 DIAGNOSIS — J40 Bronchitis, not specified as acute or chronic: Secondary | ICD-10-CM | POA: Diagnosis not present

## 2015-08-21 DIAGNOSIS — B370 Candidal stomatitis: Secondary | ICD-10-CM | POA: Diagnosis not present

## 2015-08-30 DIAGNOSIS — Z79899 Other long term (current) drug therapy: Secondary | ICD-10-CM | POA: Diagnosis not present

## 2015-09-06 DIAGNOSIS — Z0000 Encounter for general adult medical examination without abnormal findings: Secondary | ICD-10-CM | POA: Diagnosis not present

## 2015-10-17 DIAGNOSIS — A084 Viral intestinal infection, unspecified: Secondary | ICD-10-CM | POA: Diagnosis not present

## 2015-10-17 DIAGNOSIS — J40 Bronchitis, not specified as acute or chronic: Secondary | ICD-10-CM | POA: Diagnosis not present

## 2015-10-22 DIAGNOSIS — M2550 Pain in unspecified joint: Secondary | ICD-10-CM | POA: Diagnosis not present

## 2015-10-22 DIAGNOSIS — J40 Bronchitis, not specified as acute or chronic: Secondary | ICD-10-CM | POA: Diagnosis not present

## 2015-10-22 DIAGNOSIS — R05 Cough: Secondary | ICD-10-CM | POA: Diagnosis not present

## 2015-10-22 DIAGNOSIS — J4522 Mild intermittent asthma with status asthmaticus: Secondary | ICD-10-CM | POA: Diagnosis not present

## 2015-10-30 DIAGNOSIS — J189 Pneumonia, unspecified organism: Secondary | ICD-10-CM | POA: Diagnosis not present

## 2015-11-20 DIAGNOSIS — J189 Pneumonia, unspecified organism: Secondary | ICD-10-CM | POA: Diagnosis not present

## 2015-12-11 DIAGNOSIS — J4522 Mild intermittent asthma with status asthmaticus: Secondary | ICD-10-CM | POA: Diagnosis not present

## 2015-12-11 DIAGNOSIS — J159 Unspecified bacterial pneumonia: Secondary | ICD-10-CM | POA: Diagnosis not present

## 2015-12-18 DIAGNOSIS — J40 Bronchitis, not specified as acute or chronic: Secondary | ICD-10-CM | POA: Diagnosis not present

## 2015-12-18 DIAGNOSIS — J4522 Mild intermittent asthma with status asthmaticus: Secondary | ICD-10-CM | POA: Diagnosis not present

## 2015-12-27 DIAGNOSIS — J40 Bronchitis, not specified as acute or chronic: Secondary | ICD-10-CM | POA: Diagnosis not present

## 2016-02-29 DIAGNOSIS — J40 Bronchitis, not specified as acute or chronic: Secondary | ICD-10-CM | POA: Diagnosis not present

## 2016-02-29 DIAGNOSIS — J4522 Mild intermittent asthma with status asthmaticus: Secondary | ICD-10-CM | POA: Diagnosis not present

## 2016-02-29 DIAGNOSIS — Z0000 Encounter for general adult medical examination without abnormal findings: Secondary | ICD-10-CM | POA: Diagnosis not present

## 2016-03-06 DIAGNOSIS — I10 Essential (primary) hypertension: Secondary | ICD-10-CM | POA: Diagnosis not present

## 2016-03-06 DIAGNOSIS — J4522 Mild intermittent asthma with status asthmaticus: Secondary | ICD-10-CM | POA: Diagnosis not present

## 2016-04-03 DIAGNOSIS — H26493 Other secondary cataract, bilateral: Secondary | ICD-10-CM | POA: Diagnosis not present

## 2016-05-12 DIAGNOSIS — D229 Melanocytic nevi, unspecified: Secondary | ICD-10-CM | POA: Diagnosis not present

## 2016-05-12 DIAGNOSIS — L812 Freckles: Secondary | ICD-10-CM | POA: Diagnosis not present

## 2016-05-12 DIAGNOSIS — L720 Epidermal cyst: Secondary | ICD-10-CM | POA: Diagnosis not present

## 2016-05-12 DIAGNOSIS — L578 Other skin changes due to chronic exposure to nonionizing radiation: Secondary | ICD-10-CM | POA: Diagnosis not present

## 2016-05-12 DIAGNOSIS — L820 Inflamed seborrheic keratosis: Secondary | ICD-10-CM | POA: Diagnosis not present

## 2016-05-12 DIAGNOSIS — Z85828 Personal history of other malignant neoplasm of skin: Secondary | ICD-10-CM | POA: Diagnosis not present

## 2016-05-12 DIAGNOSIS — L821 Other seborrheic keratosis: Secondary | ICD-10-CM | POA: Diagnosis not present

## 2016-05-12 DIAGNOSIS — Z1283 Encounter for screening for malignant neoplasm of skin: Secondary | ICD-10-CM | POA: Diagnosis not present

## 2016-05-12 DIAGNOSIS — D1800 Hemangioma unspecified site: Secondary | ICD-10-CM | POA: Diagnosis not present

## 2016-09-02 DIAGNOSIS — I10 Essential (primary) hypertension: Secondary | ICD-10-CM | POA: Diagnosis not present

## 2016-09-05 DIAGNOSIS — J40 Bronchitis, not specified as acute or chronic: Secondary | ICD-10-CM | POA: Diagnosis not present

## 2016-09-09 DIAGNOSIS — Z0000 Encounter for general adult medical examination without abnormal findings: Secondary | ICD-10-CM | POA: Diagnosis not present

## 2016-09-30 DIAGNOSIS — R05 Cough: Secondary | ICD-10-CM | POA: Diagnosis not present

## 2016-09-30 DIAGNOSIS — J4522 Mild intermittent asthma with status asthmaticus: Secondary | ICD-10-CM | POA: Diagnosis not present

## 2016-09-30 DIAGNOSIS — J0100 Acute maxillary sinusitis, unspecified: Secondary | ICD-10-CM | POA: Diagnosis not present

## 2016-10-10 DIAGNOSIS — J40 Bronchitis, not specified as acute or chronic: Secondary | ICD-10-CM | POA: Diagnosis not present

## 2016-10-10 DIAGNOSIS — J4522 Mild intermittent asthma with status asthmaticus: Secondary | ICD-10-CM | POA: Diagnosis not present

## 2016-10-10 DIAGNOSIS — J0100 Acute maxillary sinusitis, unspecified: Secondary | ICD-10-CM | POA: Diagnosis not present

## 2016-12-04 DIAGNOSIS — J40 Bronchitis, not specified as acute or chronic: Secondary | ICD-10-CM | POA: Diagnosis not present

## 2016-12-04 DIAGNOSIS — A403 Sepsis due to Streptococcus pneumoniae: Secondary | ICD-10-CM | POA: Diagnosis not present

## 2016-12-04 DIAGNOSIS — J4522 Mild intermittent asthma with status asthmaticus: Secondary | ICD-10-CM | POA: Diagnosis not present

## 2016-12-22 DIAGNOSIS — J0100 Acute maxillary sinusitis, unspecified: Secondary | ICD-10-CM | POA: Diagnosis not present

## 2017-01-05 DIAGNOSIS — J0101 Acute recurrent maxillary sinusitis: Secondary | ICD-10-CM | POA: Diagnosis not present

## 2017-01-28 DIAGNOSIS — J329 Chronic sinusitis, unspecified: Secondary | ICD-10-CM | POA: Diagnosis not present

## 2017-01-28 DIAGNOSIS — H6123 Impacted cerumen, bilateral: Secondary | ICD-10-CM | POA: Diagnosis not present

## 2017-01-28 DIAGNOSIS — J309 Allergic rhinitis, unspecified: Secondary | ICD-10-CM | POA: Diagnosis not present

## 2017-03-18 DIAGNOSIS — L237 Allergic contact dermatitis due to plants, except food: Secondary | ICD-10-CM | POA: Diagnosis not present

## 2017-04-13 ENCOUNTER — Other Ambulatory Visit: Payer: Self-pay

## 2017-04-13 NOTE — Patient Outreach (Signed)
     Unsuccessful attempt made to contact patient via telephone for this HTA Screening call.  Plan: Make another attempt in the next 21 day.

## 2017-05-13 DIAGNOSIS — L578 Other skin changes due to chronic exposure to nonionizing radiation: Secondary | ICD-10-CM | POA: Diagnosis not present

## 2017-05-13 DIAGNOSIS — D1800 Hemangioma unspecified site: Secondary | ICD-10-CM | POA: Diagnosis not present

## 2017-05-13 DIAGNOSIS — I8393 Asymptomatic varicose veins of bilateral lower extremities: Secondary | ICD-10-CM | POA: Diagnosis not present

## 2017-05-13 DIAGNOSIS — L739 Follicular disorder, unspecified: Secondary | ICD-10-CM | POA: Diagnosis not present

## 2017-05-13 DIAGNOSIS — L918 Other hypertrophic disorders of the skin: Secondary | ICD-10-CM | POA: Diagnosis not present

## 2017-05-13 DIAGNOSIS — L821 Other seborrheic keratosis: Secondary | ICD-10-CM | POA: Diagnosis not present

## 2017-05-13 DIAGNOSIS — L720 Epidermal cyst: Secondary | ICD-10-CM | POA: Diagnosis not present

## 2017-05-13 DIAGNOSIS — L812 Freckles: Secondary | ICD-10-CM | POA: Diagnosis not present

## 2017-05-13 DIAGNOSIS — D229 Melanocytic nevi, unspecified: Secondary | ICD-10-CM | POA: Diagnosis not present

## 2017-05-13 DIAGNOSIS — Z85828 Personal history of other malignant neoplasm of skin: Secondary | ICD-10-CM | POA: Diagnosis not present

## 2017-06-11 DIAGNOSIS — H43813 Vitreous degeneration, bilateral: Secondary | ICD-10-CM | POA: Diagnosis not present

## 2017-09-04 DIAGNOSIS — Z0000 Encounter for general adult medical examination without abnormal findings: Secondary | ICD-10-CM | POA: Diagnosis not present

## 2017-09-10 DIAGNOSIS — K210 Gastro-esophageal reflux disease with esophagitis: Secondary | ICD-10-CM | POA: Diagnosis not present

## 2017-09-10 DIAGNOSIS — Z79899 Other long term (current) drug therapy: Secondary | ICD-10-CM | POA: Diagnosis not present

## 2017-09-10 DIAGNOSIS — Z0000 Encounter for general adult medical examination without abnormal findings: Secondary | ICD-10-CM | POA: Diagnosis not present

## 2017-09-10 DIAGNOSIS — G5702 Lesion of sciatic nerve, left lower limb: Secondary | ICD-10-CM | POA: Diagnosis not present

## 2017-09-20 DIAGNOSIS — J209 Acute bronchitis, unspecified: Secondary | ICD-10-CM | POA: Diagnosis not present

## 2017-09-30 DIAGNOSIS — G5702 Lesion of sciatic nerve, left lower limb: Secondary | ICD-10-CM | POA: Diagnosis not present

## 2017-09-30 DIAGNOSIS — J45902 Unspecified asthma with status asthmaticus: Secondary | ICD-10-CM | POA: Diagnosis not present

## 2017-09-30 DIAGNOSIS — J40 Bronchitis, not specified as acute or chronic: Secondary | ICD-10-CM | POA: Diagnosis not present

## 2018-01-11 DIAGNOSIS — M160 Bilateral primary osteoarthritis of hip: Secondary | ICD-10-CM | POA: Diagnosis not present

## 2018-01-11 DIAGNOSIS — M25552 Pain in left hip: Secondary | ICD-10-CM | POA: Diagnosis not present

## 2018-01-22 DIAGNOSIS — J40 Bronchitis, not specified as acute or chronic: Secondary | ICD-10-CM | POA: Diagnosis not present

## 2018-02-08 DIAGNOSIS — M1612 Unilateral primary osteoarthritis, left hip: Secondary | ICD-10-CM | POA: Diagnosis not present

## 2018-02-08 DIAGNOSIS — M25572 Pain in left ankle and joints of left foot: Secondary | ICD-10-CM | POA: Diagnosis not present

## 2018-02-08 DIAGNOSIS — M19072 Primary osteoarthritis, left ankle and foot: Secondary | ICD-10-CM | POA: Diagnosis not present

## 2018-02-08 DIAGNOSIS — I739 Peripheral vascular disease, unspecified: Secondary | ICD-10-CM | POA: Diagnosis not present

## 2018-03-02 ENCOUNTER — Encounter (INDEPENDENT_AMBULATORY_CARE_PROVIDER_SITE_OTHER): Payer: Self-pay | Admitting: Vascular Surgery

## 2018-03-02 ENCOUNTER — Ambulatory Visit (INDEPENDENT_AMBULATORY_CARE_PROVIDER_SITE_OTHER): Payer: Medicare HMO | Admitting: Vascular Surgery

## 2018-03-02 VITALS — BP 133/73 | HR 86 | Resp 16 | Ht 65.5 in | Wt 150.8 lb

## 2018-03-02 DIAGNOSIS — I10 Essential (primary) hypertension: Secondary | ICD-10-CM | POA: Diagnosis not present

## 2018-03-02 DIAGNOSIS — M79609 Pain in unspecified limb: Secondary | ICD-10-CM | POA: Insufficient documentation

## 2018-03-02 DIAGNOSIS — M79605 Pain in left leg: Secondary | ICD-10-CM

## 2018-03-02 DIAGNOSIS — M1990 Unspecified osteoarthritis, unspecified site: Secondary | ICD-10-CM | POA: Diagnosis not present

## 2018-03-02 DIAGNOSIS — M79604 Pain in right leg: Secondary | ICD-10-CM

## 2018-03-02 NOTE — Assessment & Plan Note (Signed)
blood pressure control important in reducing the progression of atherosclerotic disease. On appropriate oral medications.  

## 2018-03-02 NOTE — Assessment & Plan Note (Signed)

## 2018-03-02 NOTE — Progress Notes (Signed)
Patient ID: [name], female   DOB: 1927-05-14, 84 y.o.   MRN: [id number]  Chief Complaint  Patient presents with  . New Patient (Initial Visit)    ref Arvella Nigh for claudication    HPI [name] is a 84 y.o. female.  I am asked to see the patient by Geralyn Flash, PA-C for evaluation of claudication.  The patient reports left calf and lower leg pain.  She occasionally has some in the right leg as well.  This is associated with some mild swelling.  She has been wearing compression stockings for about 15 years now.  This has previously helped but does not currently seem to help much.  She was started on an anti-inflammatory by her primary care physician which has helped.  She denies any chest pain or shortness of breath.  There is no clear inciting event or causative factor that started the symptoms.  No fever or chills.   Past Medical History:  Diagnosis Date  . Arthritis   . Hypertension      Family History  Problem Relation Age of Onset  . Dementia Mother   . Heart attack Father   No bleeding or clotting disorders  Social History Social History   Tobacco Use  . Smoking status: Never Smoker  . Smokeless tobacco: Never Used  Substance Use Topics  . Alcohol use: Never    Frequency: Never  . Drug use: Never     Allergies  Allergen Reactions  . Amoxicillin-Pot Clavulanate Nausea Only  . Penicillins Rash  . Sulfa Antibiotics Rash    Current Outpatient Medications  Medication Sig Dispense Refill  . Ascorbic Acid (VITAMIN C) 1000 MG tablet Take 1,000 mg by mouth daily.    . Calcium Carbonate-Vitamin D (CALCIUM HIGH POTENCY/VITAMIN D) 600-200 MG-UNIT TABS Take by mouth.    . carboxymethylcellulose (REFRESH TEARS) 0.5 % SOLN Apply to eye.    . diltiazem (CARTIA XT) 240 MG 24 hr capsule Take by mouth.    . escitalopram (LEXAPRO) 5 MG tablet Take by mouth.    . etodolac (LODINE) 400 MG tablet Take by mouth.    . fluticasone (FLONASE) 50 MCG/ACT nasal spray Place into the  nose.    . loratadine (CLARITIN) 10 MG tablet Take by mouth.    Marland Kitchen omeprazole (PRILOSEC) 20 MG capsule Take by mouth.    . potassium chloride SA (K-DUR,KLOR-CON) 20 MEQ tablet Take by mouth.    Marland Kitchen SACCHAROMYCES BOULARDII PO Take by mouth.    . triamterene-hydrochlorothiazide (DYAZIDE) 37.5-25 MG capsule Take by mouth.     No current facility-administered medications for this visit.       REVIEW OF SYSTEMS (Negative unless checked)  Constitutional: [] Weight loss  [] Fever  [] Chills Cardiac: [] Chest pain   [] Chest pressure   [] Palpitations   [] Shortness of breath when laying flat   [] Shortness of breath at rest   [] Shortness of breath with exertion. Vascular:  [] Pain in legs with walking   [] Pain in legs at rest   [] Pain in legs when laying flat   [] Claudication   [] Pain in feet when walking  [] Pain in feet at rest  [] Pain in feet when laying flat   [] History of DVT   [] Phlebitis   [x] Swelling in legs   [x] Varicose veins   [] Non-healing ulcers Pulmonary:   [] Uses home oxygen   [] Productive cough   [] Hemoptysis   [] Wheeze  [] COPD   [] Asthma Neurologic:  [] Dizziness  [] Blackouts   [] Seizures   []   History of stroke   [] History of TIA  [] Aphasia   [] Temporary blindness   [] Dysphagia   [] Weakness or numbness in arms   [] Weakness or numbness in legs Musculoskeletal:  [x] Arthritis   [] Joint swelling   [x] Joint pain   [] Low back pain Hematologic:  [] Easy bruising  [] Easy bleeding   [] Hypercoagulable state   [] Anemic  [] Hepatitis Gastrointestinal:  [] Blood in stool   [] Vomiting blood  [] Gastroesophageal reflux/heartburn   [] Abdominal pain Genitourinary:  [] Chronic kidney disease   [] Difficult urination  [] Frequent urination  [] Burning with urination   [] Hematuria Skin:  [] Rashes   [] Ulcers   [] Wounds Psychological:  [] History of anxiety   []  History of major depression.    Physical Exam BP 133/73 (BP Location: Right Arm)   Pulse 86   Resp 16   Ht 5' 5.5" (1.664 m)   Wt 150 lb 12.8 oz (68.4 kg)    BMI 24.71 kg/m  Gen:  WD/WN, NAD.  Appears much younger than stated age Head: [place]/AT, No temporalis wasting.  Ear/Nose/Throat: Hearing grossly intact, nares w/o erythema or drainage, oropharynx w/o Erythema/Exudate Eyes: Conjunctiva clear, sclera non-icteric  Neck: trachea midline.  No JVD.  Pulmonary:  Good air movement, respirations not labored, no use of accessory muscles Cardiac: RRR Vascular:  Vessel Right Left  Radial Palpable Palpable                          PT  1+ palpable  1+ palpable  DP Palpable  1+ palpable   Gastrointestinal: soft, non-tender/non-distended Musculoskeletal: M/S 5/5 throughout.  Extremities without ischemic changes.  No deformity or atrophy.  Trace right lower extremity edema, 1+ left lower extremity edema.  Diffuse varicosities bilaterally Neurologic: Sensation grossly intact in extremities.  Symmetrical.  Speech is fluent. Motor exam as listed above. Psychiatric: Judgment intact, Mood & affect appropriate for pt's clinical situation. Dermatologic: No rashes or ulcers noted.  No cellulitis or open wounds.  Radiology No results found.  Labs No results found for this or any previous visit (from the past 2160 hour(s)).  Assessment/Plan:  Hypertension blood pressure control important in reducing the progression of atherosclerotic disease. On appropriate oral medications.   Arthritis Could be contributing to LE pain and swelling  Pain in limb  Recommend:  The patient has atypical pain symptoms for pure atherosclerotic disease. However, on physical exam there is evidence of mixed venous and arterial disease, given the diminished pulses and the edema associated with venous changes of the legs.  Noninvasive studies including ABI's and venous ultrasound of the legs will be obtained and the patient will follow up with me to review these studies.  The patient should continue walking and begin a more formal exercise program. The patient should  continue his antiplatelet therapy and aggressive treatment of the lipid abnormalities.  The patient should begin wearing graduated compression socks 15-20 mmHg strength to control edema.       Leotis Pain 03/02/2018, 4:30 PM   This note was created with Dragon medical transcription system.  Any errors from dictation are unintentional.

## 2018-03-02 NOTE — Assessment & Plan Note (Signed)
Could be contributing to LE pain and swelling

## 2018-03-02 NOTE — Patient Instructions (Signed)

## 2018-03-13 ENCOUNTER — Emergency Department: Payer: Medicare HMO

## 2018-03-13 ENCOUNTER — Other Ambulatory Visit: Payer: Self-pay

## 2018-03-13 ENCOUNTER — Emergency Department
Admission: EM | Admit: 2018-03-13 | Discharge: 2018-03-14 | Disposition: A | Discharge status: Home / Self Care | Payer: Medicare HMO | Attending: Emergency Medicine | Admitting: Emergency Medicine

## 2018-03-13 ENCOUNTER — Encounter: Payer: Self-pay | Admitting: Emergency Medicine

## 2018-03-13 DIAGNOSIS — K529 Noninfective gastroenteritis and colitis, unspecified: Secondary | ICD-10-CM | POA: Diagnosis not present

## 2018-03-13 DIAGNOSIS — R197 Diarrhea, unspecified: Secondary | ICD-10-CM | POA: Insufficient documentation

## 2018-03-13 DIAGNOSIS — R109 Unspecified abdominal pain: Secondary | ICD-10-CM | POA: Insufficient documentation

## 2018-03-13 DIAGNOSIS — R1084 Generalized abdominal pain: Secondary | ICD-10-CM | POA: Diagnosis not present

## 2018-03-13 DIAGNOSIS — Z79899 Other long term (current) drug therapy: Secondary | ICD-10-CM | POA: Insufficient documentation

## 2018-03-13 DIAGNOSIS — I10 Essential (primary) hypertension: Secondary | ICD-10-CM | POA: Diagnosis not present

## 2018-03-13 LAB — COMPREHENSIVE METABOLIC PANEL
ALBUMIN: 3.4 g/dL — AB (ref 3.5–5.0)
ALK PHOS: 63 U/L (ref 38–126)
ALT: 10 U/L — ABNORMAL LOW (ref 14–54)
ANION GAP: 11 (ref 5–15)
AST: 17 U/L (ref 15–41)
BILIRUBIN TOTAL: 0.6 mg/dL (ref 0.3–1.2)
BUN: 20 mg/dL (ref 6–20)
CO2: 23 mmol/L (ref 22–32)
Calcium: 9.2 mg/dL (ref 8.9–10.3)
Chloride: 104 mmol/L (ref 101–111)
Creatinine, Ser: 0.87 mg/dL (ref 0.44–1.00)
GFR, EST NON AFRICAN AMERICAN: 57 mL/min — AB (ref >60)
GLUCOSE: 101 mg/dL — AB (ref 65–99)
POTASSIUM: 3.5 mmol/L (ref 3.5–5.1)
Sodium: 138 mmol/L (ref 135–145)
TOTAL PROTEIN: 6.5 g/dL (ref 6.5–8.1)

## 2018-03-13 LAB — GASTROINTESTINAL PANEL BY PCR, STOOL (REPLACES STOOL CULTURE)

## 2018-03-13 LAB — CBC
HEMATOCRIT: 38.2 % (ref 35.0–47.0)
HEMOGLOBIN: 12.9 g/dL (ref 12.0–16.0)
MCH: 30.8 pg (ref 26.0–34.0)
MCHC: 33.8 g/dL (ref 32.0–36.0)
MCV: 91.1 fL (ref 80.0–100.0)
Platelets: 212 10*3/uL (ref 150–440)
RBC: 4.2 MIL/uL (ref 3.80–5.20)
RDW: 15.7 % — ABNORMAL HIGH (ref 11.5–14.5)
WBC: 5.7 10*3/uL (ref 3.6–11.0)

## 2018-03-13 LAB — URINALYSIS, COMPLETE (UACMP) WITH MICROSCOPIC
Bacteria, UA: NONE SEEN
Bilirubin Urine: NEGATIVE
GLUCOSE, UA: NEGATIVE mg/dL
HGB URINE DIPSTICK: NEGATIVE
KETONES UR: NEGATIVE mg/dL
Leukocytes, UA: NEGATIVE
NITRITE: NEGATIVE
PROTEIN: NEGATIVE mg/dL
Specific Gravity, Urine: 1.006 (ref 1.005–1.030)
pH: 5 (ref 5.0–8.0)

## 2018-03-13 LAB — LIPASE, BLOOD: Lipase: 43 U/L (ref 11–51)

## 2018-03-13 LAB — C DIFFICILE QUICK SCREEN W PCR REFLEX
C Diff antigen: NEGATIVE
C Diff interpretation: NOT DETECTED
C Diff toxin: NEGATIVE

## 2018-03-13 MED ORDER — IOPAMIDOL (ISOVUE-300) INJECTION 61%
30.0000 mL | Freq: Once | INTRAVENOUS | Status: AC | PRN
  Administered 2018-03-13: 30 mL via ORAL

## 2018-03-13 MED ORDER — SODIUM CHLORIDE 0.9 % IV BOLUS
1000.0000 mL | Freq: Once | INTRAVENOUS | Status: AC
  Administered 2018-03-13: 1000 mL via INTRAVENOUS

## 2018-03-13 MED ORDER — TRAMADOL HCL 50 MG PO TABS
25.0000 mg | ORAL_TABLET | Freq: Once | ORAL | Status: AC
  Administered 2018-03-13: 25 mg via ORAL
  Filled 2018-03-13: qty 1

## 2018-03-13 MED ORDER — CIPROFLOXACIN IN D5W 400 MG/200ML IV SOLN
400.0000 mg | Freq: Once | INTRAVENOUS | Status: AC
  Administered 2018-03-13: 400 mg via INTRAVENOUS
  Filled 2018-03-13: qty 200

## 2018-03-13 MED ORDER — METRONIDAZOLE IN NACL 5-0.79 MG/ML-% IV SOLN
500.0000 mg | Freq: Once | INTRAVENOUS | Status: AC
  Administered 2018-03-14: 500 mg via INTRAVENOUS
  Filled 2018-03-13: qty 100

## 2018-03-13 MED ORDER — IOPAMIDOL (ISOVUE-300) INJECTION 61%
100.0000 mL | Freq: Once | INTRAVENOUS | Status: AC | PRN
  Administered 2018-03-13: 100 mL via INTRAVENOUS

## 2018-03-13 MED ORDER — ONDANSETRON HCL 4 MG/2ML IJ SOLN
4.0000 mg | Freq: Once | INTRAMUSCULAR | Status: AC
  Administered 2018-03-13: 4 mg via INTRAVENOUS
  Filled 2018-03-13: qty 2

## 2018-03-13 NOTE — ED Notes (Signed)
Pt reports having diarrhea for the past 11 days, pt states that she thought she ate something that has caused her to have the diarrhea, with light palpation to the mid to upper abd on the right side pt reports some mild discomfort, pt is here a lone, states that her family has most likely went out for dinner. Pt states that she took an immodium at 4315 and is uncertain if she can give a stool sample at this time. Pt is in no obvious distress at this time.

## 2018-03-13 NOTE — ED Notes (Signed)
CT at bedside with oral contrast.

## 2018-03-13 NOTE — ED Provider Notes (Signed)
Patient received in sign-out from Dr. Cinda Quest.  Workup and evaluation pending CT imaging reassessment.  CT imaging does show mild inflammatory changes suggesting some component of colitis.  Based on her symptoms and diarrhea we will go ahead and start on antibiotics for presumed infectious colitis.  Patient given Zofran with improvement in her symptoms.  She is tolerating oral hydration.  Discussed signs and symptoms for which she should return to the ER.  At this point do believe she stable and appropriate for outpatient follow-up.      Merlyn Lot, MD 03/13/18 [id number]

## 2018-03-13 NOTE — ED Provider Notes (Signed)
[facility] Emergency Department Provider Note   ____________________________________________   First MD Initiated Contact with Patient 03/13/18 1743     (approximate)  I have reviewed the triage vital signs and the nursing notes.   HISTORY  Chief Complaint Abdominal Pain and Diarrhea   HPI [name] is a 84 y.o. female patient reports diarrhea for the last 11 days.  She thought it was something she ate.  She has about 2 stools a day which are at least soft is not runny.  She has no fever.  She has some mild diffuse abdominal pain which on repeated exam seems to move around.  No vomiting or fever.   Past Medical History:  Diagnosis Date  . Arthritis   . Hypertension     Patient Active Problem List   Diagnosis Date Noted  . Hypertension 03/02/2018  . Arthritis 03/02/2018  . Pain in limb 03/02/2018    Past Surgical History:  Procedure Laterality Date  . APPENDECTOMY    . EYE SURGERY    . HYSTEROTOMY    . OOPHORECTOMY    . TONSILLECTOMY      Prior to Admission medications   Medication Sig Start Date End Date Taking? Authorizing Provider  Ascorbic Acid (VITAMIN C) 1000 MG tablet Take 1,000 mg by mouth daily.   Yes [provider]  Calcium Carbonate-Vitamin D (CALCIUM HIGH POTENCY/VITAMIN D) 600-200 MG-UNIT TABS Take 2 tablets by mouth daily.    Yes [provider]  diltiazem (CARTIA XT) 240 MG 24 hr capsule Take 240 mg by mouth daily.  02/02/18  Yes [provider]  escitalopram (LEXAPRO) 5 MG tablet Take 5 mg by mouth daily.  02/02/18  Yes [provider]  etodolac (LODINE) 400 MG tablet Take 400 mg by mouth daily.  01/11/18 01/11/19 Yes [provider]  fluticasone (FLONASE) 50 MCG/ACT nasal spray Place 1 spray into the nose daily as needed.  09/09/16  Yes [provider]  loratadine (CLARITIN) 10 MG tablet Take 10 mg by mouth daily.    Yes [provider]  omeprazole (PRILOSEC) 20 MG  capsule Take 20 mg by mouth daily.   Yes [provider]  potassium chloride SA (K-DUR,KLOR-CON) 20 MEQ tablet Take 20 mEq by mouth 2 (two) times daily.  02/02/18  Yes [provider]  saccharomyces boulardii (FLORASTOR) 250 MG capsule Take 1 capsule by mouth daily.    Yes [provider]  triamterene-hydrochlorothiazide (DYAZIDE) 37.5-25 MG capsule Take 1 capsule by mouth daily.  08/17/17  Yes [provider]    Allergies Amoxicillin-pot clavulanate; Penicillins; and Sulfa antibiotics  Family History  Problem Relation Age of Onset  . Dementia Mother   . Heart attack Father     Social History Social History   Tobacco Use  . Smoking status: Never Smoker  . Smokeless tobacco: Never Used  Substance Use Topics  . Alcohol use: Never    Frequency: Never  . Drug use: Never    Review of Systems  Constitutional: No fever/chills Eyes: No visual changes. ENT: No sore throat. Cardiovascular: Denies chest pain. Respiratory: Denies shortness of breath. Gastrointestinal: See HPI Genitourinary: Negative for dysuria. Musculoskeletal: Negative for back pain. Skin: Negative for rash. Neurological: Negative for headaches, focal weakness   ____________________________________________   PHYSICAL EXAM:  VITAL SIGNS: ED Triage Vitals  Enc Vitals Group     BP 03/13/18 1725 (!) 160/61     Pulse Rate 03/13/18 1725 93  Resp 03/13/18 1725 18     Temp 03/13/18 1725 99.1 F (37.3 C)     Temp Source 03/13/18 1725 Oral     SpO2 03/13/18 1725 95 %     Weight 03/13/18 1727 150 lb (68 kg)     Height 03/13/18 1727 5' 5.5" (1.664 m)     Head Circumference --      Peak Flow --      Pain Score 03/13/18 1727 5     Pain Loc --      Pain Edu? --      Excl. in [place]? --     Constitutional: Alert and oriented. Well appearing and in no acute distress. Eyes: Conjunctivae are normal. Head: Atraumatic. Nose: No congestion/rhinnorhea. Mouth/Throat: Mucous  membranes are moist.  Oropharynx non-erythematous. Neck: No stridor.  Cardiovascular: Normal rate, regular rhythm. Grossly normal heart sounds.  Good peripheral circulation. Respiratory: Normal respiratory effort.  No retractions. Lungs CTAB. Gastrointestinal: Soft mild diffuse pain on first exam is more in the right middle abdomen later on it was little higher on the left side.  No distention. No abdominal bruits. No CVA tenderness. Musculoskeletal: No lower extremity tenderness nor edema.  No joint effusions. Neurologic:  Normal speech and language. No gross focal neurologic deficits are appreciated. No gait instability. Skin:  Skin is warm, dry and intact. No rash noted. Psychiatric: Mood and affect are normal. Speech and behavior are normal.  ____________________________________________   LABS (all labs ordered are listed, but only abnormal results are displayed)  Labs Reviewed  COMPREHENSIVE METABOLIC PANEL - Abnormal; Notable for the following components:      Result Value   Glucose, Bld 101 (*)    Albumin 3.4 (*)    ALT 10 (*)    GFR calc non Af Amer 57 (*)    All other components within normal limits  CBC - Abnormal; Notable for the following components:   RDW 15.7 (*)    All other components within normal limits  C DIFFICILE QUICK SCREEN W PCR REFLEX  GASTROINTESTINAL PANEL BY PCR, STOOL (REPLACES STOOL CULTURE)  LIPASE, BLOOD  URINALYSIS, COMPLETE (UACMP) WITH MICROSCOPIC  DIFFERENTIAL   ____________________________________________  EKG   ____________________________________________  RADIOLOGY  ED MD interpretation:   Official radiology report(s): No results found.  ____________________________________________   PROCEDURES  Procedure(s) performed:   Procedures  Critical Care performed:   ____________________________________________   INITIAL IMPRESSION / ASSESSMENT AND PLAN / ED COURSE  C. difficile and gastrointestinal panel are negative.   Since the patient does have abdominal pain and she is rather old I will get a CT of her belly and make sure there is nothing going on in there.  We will sign the patient out to Dr. Quentin Cornwall.     ____________________________________________   FINAL CLINICAL IMPRESSION(S) / ED DIAGNOSES  Final diagnoses:  Diarrhea, unspecified type  Abdominal pain, unspecified abdominal location     ED Discharge Orders    None       Note:  This document was prepared using Dragon voice recognition software and may include unintentional dictation errors.    Nena Polio, MD 03/13/18 2105

## 2018-03-13 NOTE — ED Triage Notes (Signed)
Abdominal pain and diarrhea x 11 days. States usually 2 to 3 loose stools per day.

## 2018-03-13 NOTE — ED Notes (Signed)
Pt waiting to see if nausea medication decreases nausea before taking PO pain medication.

## 2018-03-13 NOTE — ED Notes (Signed)
MD at bedside to update pt and patients family. PT able to eat and drink without distress or nausea.

## 2018-03-13 NOTE — ED Notes (Signed)
Pt finished her contrast. Ct taking pt now

## 2018-03-14 MED ORDER — ONDANSETRON 4 MG PO TBDP: 4.0000 mg | ORAL_TABLET | Freq: Three times a day (TID) | ORAL | 0 refills | Status: DC | PRN

## 2018-03-14 MED ORDER — FLUCONAZOLE 150 MG PO TABS: 150.0000 mg | ORAL_TABLET | Freq: Once | ORAL | 0 refills | Status: AC

## 2018-03-14 MED ORDER — CIPROFLOXACIN HCL 500 MG PO TABS: 500.0000 mg | ORAL_TABLET | Freq: Two times a day (BID) | ORAL | 0 refills | Status: AC

## 2018-03-14 MED ORDER — METRONIDAZOLE 250 MG PO TABS: 250.0000 mg | ORAL_TABLET | Freq: Three times a day (TID) | ORAL | 0 refills | Status: AC

## 2018-03-14 NOTE — Discharge Instructions (Signed)

## 2018-03-14 NOTE — ED Notes (Signed)
Pt finishing IV antibiotics. Family remains at bedside. Pt is no longer nauseous and continues to keep food and PO medication down.

## 2018-03-24 DIAGNOSIS — R9389 Abnormal findings on diagnostic imaging of other specified body structures: Secondary | ICD-10-CM | POA: Diagnosis not present

## 2018-03-24 DIAGNOSIS — J984 Other disorders of lung: Secondary | ICD-10-CM | POA: Diagnosis not present

## 2018-03-24 DIAGNOSIS — K5792 Diverticulitis of intestine, part unspecified, without perforation or abscess without bleeding: Secondary | ICD-10-CM | POA: Diagnosis not present

## 2018-03-30 DIAGNOSIS — F5104 Psychophysiologic insomnia: Secondary | ICD-10-CM | POA: Diagnosis not present

## 2018-03-30 DIAGNOSIS — R197 Diarrhea, unspecified: Secondary | ICD-10-CM | POA: Diagnosis not present

## 2018-04-05 DIAGNOSIS — K529 Noninfective gastroenteritis and colitis, unspecified: Secondary | ICD-10-CM | POA: Diagnosis not present

## 2018-04-05 DIAGNOSIS — E869 Volume depletion, unspecified: Secondary | ICD-10-CM | POA: Diagnosis not present

## 2018-04-06 ENCOUNTER — Other Ambulatory Visit: Payer: Self-pay | Admitting: Nurse Practitioner

## 2018-04-06 DIAGNOSIS — R1310 Dysphagia, unspecified: Secondary | ICD-10-CM

## 2018-04-06 DIAGNOSIS — R630 Anorexia: Secondary | ICD-10-CM

## 2018-04-12 ENCOUNTER — Ambulatory Visit
Admission: RE | Admit: 2018-04-12 | Discharge: 2018-04-12 | Disposition: A | Discharge status: Home / Self Care | Payer: Medicare HMO | Source: Ambulatory Visit | Attending: Nurse Practitioner | Admitting: Nurse Practitioner

## 2018-04-12 DIAGNOSIS — R1310 Dysphagia, unspecified: Secondary | ICD-10-CM

## 2018-04-12 DIAGNOSIS — K219 Gastro-esophageal reflux disease without esophagitis: Secondary | ICD-10-CM | POA: Insufficient documentation

## 2018-04-12 DIAGNOSIS — K449 Diaphragmatic hernia without obstruction or gangrene: Secondary | ICD-10-CM | POA: Diagnosis not present

## 2018-04-12 DIAGNOSIS — R630 Anorexia: Secondary | ICD-10-CM

## 2018-04-28 DIAGNOSIS — K224 Dyskinesia of esophagus: Secondary | ICD-10-CM | POA: Diagnosis not present

## 2018-04-28 DIAGNOSIS — K449 Diaphragmatic hernia without obstruction or gangrene: Secondary | ICD-10-CM | POA: Diagnosis not present

## 2018-04-28 DIAGNOSIS — K219 Gastro-esophageal reflux disease without esophagitis: Secondary | ICD-10-CM | POA: Diagnosis not present

## 2018-04-28 DIAGNOSIS — R630 Anorexia: Secondary | ICD-10-CM | POA: Diagnosis not present

## 2018-04-28 DIAGNOSIS — Z8639 Personal history of other endocrine, nutritional and metabolic disease: Secondary | ICD-10-CM | POA: Diagnosis not present

## 2018-04-29 DIAGNOSIS — K219 Gastro-esophageal reflux disease without esophagitis: Secondary | ICD-10-CM | POA: Insufficient documentation

## 2018-05-03 DIAGNOSIS — R197 Diarrhea, unspecified: Secondary | ICD-10-CM | POA: Diagnosis not present

## 2018-05-03 DIAGNOSIS — R609 Edema, unspecified: Secondary | ICD-10-CM | POA: Diagnosis not present

## 2018-05-03 DIAGNOSIS — R112 Nausea with vomiting, unspecified: Secondary | ICD-10-CM | POA: Diagnosis not present

## 2018-05-03 DIAGNOSIS — M1610 Unilateral primary osteoarthritis, unspecified hip: Secondary | ICD-10-CM | POA: Diagnosis not present

## 2018-05-03 DIAGNOSIS — K219 Gastro-esophageal reflux disease without esophagitis: Secondary | ICD-10-CM | POA: Diagnosis not present

## 2018-05-03 DIAGNOSIS — I10 Essential (primary) hypertension: Secondary | ICD-10-CM | POA: Diagnosis not present

## 2018-05-26 ENCOUNTER — Encounter (INDEPENDENT_AMBULATORY_CARE_PROVIDER_SITE_OTHER): Payer: Self-pay | Admitting: Nurse Practitioner

## 2018-05-26 ENCOUNTER — Ambulatory Visit (INDEPENDENT_AMBULATORY_CARE_PROVIDER_SITE_OTHER): Payer: Medicare HMO

## 2018-05-26 ENCOUNTER — Ambulatory Visit (INDEPENDENT_AMBULATORY_CARE_PROVIDER_SITE_OTHER): Payer: Medicare HMO | Admitting: Nurse Practitioner

## 2018-05-26 VITALS — BP 139/70 | HR 63 | Resp 11 | Ht 65.5 in | Wt 143.0 lb

## 2018-05-26 DIAGNOSIS — M79605 Pain in left leg: Secondary | ICD-10-CM | POA: Diagnosis not present

## 2018-05-26 DIAGNOSIS — I872 Venous insufficiency (chronic) (peripheral): Secondary | ICD-10-CM

## 2018-05-26 DIAGNOSIS — M79604 Pain in right leg: Secondary | ICD-10-CM

## 2018-05-26 DIAGNOSIS — K219 Gastro-esophageal reflux disease without esophagitis: Secondary | ICD-10-CM | POA: Diagnosis not present

## 2018-05-26 DIAGNOSIS — I10 Essential (primary) hypertension: Secondary | ICD-10-CM | POA: Diagnosis not present

## 2018-05-26 DIAGNOSIS — I890 Lymphedema, not elsewhere classified: Secondary | ICD-10-CM

## 2018-05-27 ENCOUNTER — Encounter (INDEPENDENT_AMBULATORY_CARE_PROVIDER_SITE_OTHER): Payer: Self-pay | Admitting: Nurse Practitioner

## 2018-05-27 NOTE — Progress Notes (Signed)
Subjective:    Patient ID: [name], female    DOB: 12/19/1926, 84 y.o.   MRN: [id number] Chief Complaint  Patient presents with  . Follow-up    Ultrasound ABI and Reflux follow up    HPI  [name] is a 84 y.o. female who presents with The patient returns to the office for followup evaluation regarding leg swelling.  The swelling has persisted and the pain associated with swelling continues. There have not been any interval development of a ulcerations or wounds.  Since the previous visit the patient has been wearing graduated compression stockings since our last visit on 03/03/1983 and has noted little if any improvement in the lymphedema. The patient has been using compression routinely morning until night.  The patient also states elevation during the day and exercise is being done too. Patient denies any nausea, fever, vomiting, diarrhea.  She denies any amaurosis fugax.  She does not have a history of deep venous thrombosis.  She denies chest pain or shortness of breath.  Today the patient underwent a lower venous reflux study.  Evidence of chronic venous insufficiency was detected bilaterally in the deep venous system.  There was no evidence of DVT bilaterally.  There was no evidence of superficial venous thrombosis.  The right leg has reflux in the saphenofemoral junction.  The left leg has reflux in the saphenofemoral junction as well as the great saphenous vein.   Constitutional: [] Weight loss  [] Fever  [] Chills Cardiac: [] Chest pain   [] Chest pressure   [] Palpitations   [] Shortness of breath when laying flat   [] Shortness of breath with exertion. Vascular:  [] Pain in legs with walking   [] Pain in legs with standing  [] History of DVT   [] Phlebitis   [] Swelling in legs   [] Varicose veins   [] Non-healing ulcers Pulmonary:   [] Uses home oxygen   [] Productive cough   [] Hemoptysis   [] Wheeze  [] COPD   [] Asthma Neurologic:  [x] Dizziness   [] Seizures   [] History of stroke   [] History  of TIA  [] Aphasia   [] Vissual changes   [] Weakness or numbness in arm   [] Weakness or numbness in leg Musculoskeletal:   [] Joint swelling   [] Joint pain   [] Low back pain Hematologic:  [] Easy bruising  [] Easy bleeding   [] Hypercoagulable state   [] Anemic Gastrointestinal:  [] Diarrhea   [] Vomiting  [] Gastroesophageal reflux/heartburn   [] Difficulty swallowing. Genitourinary:  [] Chronic kidney disease   [] Difficult urination  [] Frequent urination   [] Blood in urine Skin:  [] Rashes   [] Ulcers  Psychological:  [] History of anxiety   []  History of major depression.     Objective:   Physical Exam  BP 139/70 (BP Location: Right Arm, Patient Position: Sitting)   Pulse 63   Resp 11   Ht 5' 5.5" (1.664 m)   Wt 143 lb (64.9 kg)   BMI 23.43 kg/m   Past Medical History:  Diagnosis Date  . Arthritis   . Hypertension      Gen: WD/WN, NAD Head: [place]/AT, No temporalis wasting.  Ear/Nose/Throat: Hearing grossly intact, nares w/o erythema or drainage Eyes: PER, EOMI, sclera nonicteric.  Neck: Supple, no masses.  No JVD.  Pulmonary:  Good air movement, no use of accessory muscles.  Cardiac: RRR Vascular:  Patient currently wearing compression stockings.  2+ pitting edema present Vessel Right Left  PT  palpable  palpable  DP palpable Palpable   Gastrointestinal: soft, non-distended. No guarding/no peritoneal signs.  Musculoskeletal: M/S 5/5 throughout.  No  deformity or atrophy.  Neurologic: Pain and light touch intact in extremities.  Symmetrical.  Speech is fluent. Motor exam as listed above. Psychiatric: Judgment intact, Mood & affect appropriate for pt's clinical situation. Dermatologic: Venous rashes. No Ulcers Noted.  No changes consistent with cellulitis. Lymph : No Cervical lymphadenopathy, no lichenification, hyperkeratosis as well as dermal thickening present on both lower extremities   Social History   Socioeconomic History  . Marital status: Widowed    Spouse name: Not on file    . Number of children: Not on file  . Years of education: Not on file  . Highest education level: Not on file  Occupational History  . Not on file  Social Needs  . Financial resource strain: Not on file  . Food insecurity:    Worry: Not on file    Inability: Not on file  . Transportation needs:    Medical: Not on file    Non-medical: Not on file  Tobacco Use  . Smoking status: Never Smoker  . Smokeless tobacco: Never Used  Substance and Sexual Activity  . Alcohol use: Never    Frequency: Never  . Drug use: Never  . Sexual activity: Not on file  Lifestyle  . Physical activity:    Days per week: Not on file    Minutes per session: Not on file  . Stress: Not on file  Relationships  . Social connections:    Talks on phone: Not on file    Gets together: Not on file    Attends religious service: Not on file    Active member of club or organization: Not on file    Attends meetings of clubs or organizations: Not on file    Relationship status: Not on file  . Intimate partner violence:    Fear of current or ex partner: Not on file    Emotionally abused: Not on file    Physically abused: Not on file    Forced sexual activity: Not on file  Other Topics Concern  . Not on file  Social History Narrative  . Not on file    Past Surgical History:  Procedure Laterality Date  . APPENDECTOMY    . EYE SURGERY    . HYSTEROTOMY    . OOPHORECTOMY    . TONSILLECTOMY      Family History  Problem Relation Age of Onset  . Dementia Mother   . Heart attack Father     Allergies  Allergen Reactions  . Amoxicillin-Pot Clavulanate Nausea Only  . Cefprozil Nausea Only  . Clarithromycin Nausea Only  . Prednisone     Other reaction(s): Other (See Comments) Hyperglycemia  . Pseudoephedrine     Other reaction(s): Unknown  . Penicillins Rash  . Sulfa Antibiotics Rash       Assessment & Plan:   1. Lymphedema Recommend:  No surgery or intervention at this point in time.    I  have reviewed my previous discussion with the patient regarding swelling and why it causes symptoms.  Patient will continue wearing graduated compression stockings class 1 (20-30 mmHg) on a daily basis. The patient will  beginning wearing the stockings first thing in the morning and removing them in the evening. The patient is instructed specifically not to sleep in the stockings.    In addition, behavioral modification including several periods of elevation of the lower extremities during the day will be continued.  This was reviewed with the patient during the initial visit.  The  patient will also continue routine exercise, especially walking on a daily basis as was discussed during the initial visit.    Despite conservative treatments including graduated compression therapy class 1 and behavioral modification including exercise and elevation the patient  has not obtained adequate control of the lymphedema.  The patient still has stage 3 lymphedema and therefore, I believe that a lymph pump should be added to improve the control of the patient's lymphedema.  Additionally, a lymph pump is warranted because it will reduce the risk of cellulitis and ulceration in the future.  Patient should follow-up in six months    2. Chronic venous insufficiency See above   3. Essential hypertension Continue antihypertensive medications as already ordered, these medications have been reviewed and there are no changes at this time.   4. Gastroesophageal reflux disease, esophagitis presence not specified Continue PPI as already ordered, this medication has been reviewed and there are no changes at this time.  Avoidence of caffeine and alcohol  Moderate elevation of the head of the bed    Current Outpatient Medications on File Prior to Visit  Medication Sig Dispense Refill  . diltiazem (CARTIA XT) 240 MG 24 hr capsule Take 240 mg by mouth daily.     . diphenoxylate-atropine (LOMOTIL) 2.5-0.025 MG tablet      . escitalopram (LEXAPRO) 5 MG tablet Take 5 mg by mouth daily.     . fluticasone (FLONASE) 50 MCG/ACT nasal spray Place 1 spray into the nose daily as needed.     . loratadine (CLARITIN) 10 MG tablet Take 10 mg by mouth daily.     Marland Kitchen omeprazole (PRILOSEC) 20 MG capsule Take 20 mg by mouth daily.    . potassium chloride SA (K-DUR,KLOR-CON) 20 MEQ tablet Take 20 mEq by mouth 2 (two) times daily.     . ranitidine (ZANTAC) 150 MG tablet Take by mouth.    . triamterene-hydrochlorothiazide (DYAZIDE) 37.5-25 MG capsule Take 1 capsule by mouth daily.     . Ascorbic Acid (VITAMIN C) 1000 MG tablet Take 1,000 mg by mouth daily.    . Calcium Carbonate-Vitamin D (CALCIUM HIGH POTENCY/VITAMIN D) 600-200 MG-UNIT TABS Take 2 tablets by mouth daily.     Marland Kitchen etodolac (LODINE) 400 MG tablet Take 400 mg by mouth daily.     Marland Kitchen nystatin (MYCOSTATIN) 100000 UNIT/ML suspension     . ondansetron (ZOFRAN ODT) 4 MG disintegrating tablet Take 1 tablet (4 mg total) by mouth every 8 (eight) hours as needed for nausea or vomiting. (Patient not taking: Reported on 05/26/2018) 20 tablet 0  . saccharomyces boulardii (FLORASTOR) 250 MG capsule Take 1 capsule by mouth daily.      No current facility-administered medications on file prior to visit.     There are no Patient Instructions on file for this visit. Return in about 6 months (around 11/26/2018).   Kris Hartmann, NP

## 2018-06-14 ENCOUNTER — Telehealth (INDEPENDENT_AMBULATORY_CARE_PROVIDER_SITE_OTHER): Payer: Self-pay

## 2018-06-14 ENCOUNTER — Encounter (INDEPENDENT_AMBULATORY_CARE_PROVIDER_SITE_OTHER): Payer: Self-pay | Admitting: Nurse Practitioner

## 2018-06-14 NOTE — Telephone Encounter (Signed)
FYI---Patient called and requested that you move forward with ordering her a Lymphedema pump.

## 2018-06-14 NOTE — Telephone Encounter (Signed)
I have sent message for the application process to begin. It will generally be several weeks before she will hear from the company with approval.

## 2018-06-16 NOTE — Telephone Encounter (Signed)
I called the patient to let her know to expect a call from the Lymphedema pump company because Arna Medici has sent the order in to them to get her started with the Lymph pump and she was also told that the process takes several weeks to months from beginning to end. She stated that she would be on the lookout for a call from them.

## 2018-06-22 DIAGNOSIS — I10 Essential (primary) hypertension: Secondary | ICD-10-CM | POA: Diagnosis not present

## 2018-06-22 DIAGNOSIS — J40 Bronchitis, not specified as acute or chronic: Secondary | ICD-10-CM | POA: Diagnosis not present

## 2018-06-22 DIAGNOSIS — R197 Diarrhea, unspecified: Secondary | ICD-10-CM | POA: Diagnosis not present

## 2018-06-30 ENCOUNTER — Other Ambulatory Visit: Payer: Self-pay

## 2018-06-30 ENCOUNTER — Observation Stay
Admission: EM | Admit: 2018-06-30 | Discharge: 2018-07-02 | Disposition: A | Discharge status: Home / Self Care | Payer: Medicare HMO | Attending: Internal Medicine | Admitting: Internal Medicine

## 2018-06-30 ENCOUNTER — Emergency Department: Payer: Medicare HMO

## 2018-06-30 DIAGNOSIS — E871 Hypo-osmolality and hyponatremia: Secondary | ICD-10-CM | POA: Diagnosis not present

## 2018-06-30 DIAGNOSIS — Z66 Do not resuscitate: Secondary | ICD-10-CM | POA: Insufficient documentation

## 2018-06-30 DIAGNOSIS — Z8249 Family history of ischemic heart disease and other diseases of the circulatory system: Secondary | ICD-10-CM | POA: Diagnosis not present

## 2018-06-30 DIAGNOSIS — J209 Acute bronchitis, unspecified: Principal | ICD-10-CM | POA: Insufficient documentation

## 2018-06-30 DIAGNOSIS — Z86010 Personal history of colonic polyps: Secondary | ICD-10-CM | POA: Diagnosis not present

## 2018-06-30 DIAGNOSIS — F418 Other specified anxiety disorders: Secondary | ICD-10-CM | POA: Insufficient documentation

## 2018-06-30 DIAGNOSIS — J40 Bronchitis, not specified as acute or chronic: Secondary | ICD-10-CM | POA: Diagnosis not present

## 2018-06-30 DIAGNOSIS — Z888 Allergy status to other drugs, medicaments and biological substances status: Secondary | ICD-10-CM | POA: Insufficient documentation

## 2018-06-30 DIAGNOSIS — J9801 Acute bronchospasm: Secondary | ICD-10-CM | POA: Diagnosis not present

## 2018-06-30 DIAGNOSIS — R0602 Shortness of breath: Secondary | ICD-10-CM | POA: Diagnosis present

## 2018-06-30 DIAGNOSIS — K219 Gastro-esophageal reflux disease without esophagitis: Secondary | ICD-10-CM | POA: Insufficient documentation

## 2018-06-30 DIAGNOSIS — F329 Major depressive disorder, single episode, unspecified: Secondary | ICD-10-CM | POA: Diagnosis not present

## 2018-06-30 DIAGNOSIS — J45909 Unspecified asthma, uncomplicated: Secondary | ICD-10-CM | POA: Diagnosis not present

## 2018-06-30 DIAGNOSIS — Z880 Allergy status to penicillin: Secondary | ICD-10-CM | POA: Insufficient documentation

## 2018-06-30 DIAGNOSIS — Z79899 Other long term (current) drug therapy: Secondary | ICD-10-CM | POA: Insufficient documentation

## 2018-06-30 DIAGNOSIS — Z882 Allergy status to sulfonamides status: Secondary | ICD-10-CM | POA: Insufficient documentation

## 2018-06-30 DIAGNOSIS — I119 Hypertensive heart disease without heart failure: Secondary | ICD-10-CM | POA: Diagnosis not present

## 2018-06-30 DIAGNOSIS — M1990 Unspecified osteoarthritis, unspecified site: Secondary | ICD-10-CM | POA: Diagnosis not present

## 2018-06-30 DIAGNOSIS — R05 Cough: Secondary | ICD-10-CM | POA: Diagnosis not present

## 2018-06-30 HISTORY — DX: Dyskinesia of esophagus: K22.4

## 2018-06-30 HISTORY — DX: Edema, unspecified: R60.9

## 2018-06-30 HISTORY — DX: Gastro-esophageal reflux disease without esophagitis: K21.9

## 2018-06-30 HISTORY — DX: Allergic rhinitis, unspecified: J30.9

## 2018-06-30 HISTORY — DX: Polyp of colon: K63.5

## 2018-06-30 LAB — CBC WITH DIFFERENTIAL/PLATELET
BASOS PCT: 0 %
Basophils Absolute: 0 10*3/uL (ref 0–0.1)
EOS ABS: 0.1 10*3/uL (ref 0–0.7)
EOS PCT: 1 %
HCT: 35 % (ref 35.0–47.0)
Hemoglobin: 12.4 g/dL (ref 12.0–16.0)
Lymphocytes Relative: 8 %
Lymphs Abs: 0.9 10*3/uL — ABNORMAL LOW (ref 1.0–3.6)
MCH: 33.3 pg (ref 26.0–34.0)
MCHC: 35.5 g/dL (ref 32.0–36.0)
MCV: 93.8 fL (ref 80.0–100.0)
MONO ABS: 1.1 10*3/uL — AB (ref 0.2–0.9)
MONOS PCT: 10 %
NEUTROS PCT: 81 %
Neutro Abs: 9.1 10*3/uL — ABNORMAL HIGH (ref 1.4–6.5)
PLATELETS: 288 10*3/uL (ref 150–440)
RBC: 3.74 MIL/uL — ABNORMAL LOW (ref 3.80–5.20)
RDW: 13.2 % (ref 11.5–14.5)
WBC: 11.2 10*3/uL — ABNORMAL HIGH (ref 3.6–11.0)

## 2018-06-30 LAB — BASIC METABOLIC PANEL
Anion gap: 9 (ref 5–15)
BUN: 18 mg/dL (ref 8–23)
CALCIUM: 9 mg/dL (ref 8.9–10.3)
CO2: 27 mmol/L (ref 22–32)
CREATININE: 0.93 mg/dL (ref 0.44–1.00)
Chloride: 88 mmol/L — ABNORMAL LOW (ref 98–111)
GFR calc non Af Amer: 53 mL/min — ABNORMAL LOW (ref >60)
Glucose, Bld: 126 mg/dL — ABNORMAL HIGH (ref 70–99)
Potassium: 3.6 mmol/L (ref 3.5–5.1)
SODIUM: 124 mmol/L — AB (ref 135–145)

## 2018-06-30 LAB — BRAIN NATRIURETIC PEPTIDE: B Natriuretic Peptide: 60 pg/mL (ref 0.0–100.0)

## 2018-06-30 LAB — TROPONIN I

## 2018-06-30 MED ORDER — METHYLPREDNISOLONE SODIUM SUCC 125 MG IJ SOLR
60.0000 mg | Freq: Four times a day (QID) | INTRAMUSCULAR | Status: DC
  Administered 2018-07-01 (×3): 60 mg via INTRAVENOUS
  Filled 2018-06-30 (×3): qty 2

## 2018-06-30 MED ORDER — GUAIFENESIN ER 600 MG PO TB12
600.0000 mg | ORAL_TABLET | Freq: Two times a day (BID) | ORAL | Status: DC
  Administered 2018-06-30 – 2018-07-02 (×4): 600 mg via ORAL
  Filled 2018-06-30 (×4): qty 1

## 2018-06-30 MED ORDER — ESCITALOPRAM OXALATE 10 MG PO TABS
5.0000 mg | ORAL_TABLET | Freq: Every day | ORAL | Status: DC
  Administered 2018-06-30 – 2018-07-01 (×2): 5 mg via ORAL
  Filled 2018-06-30 (×2): qty 0.5

## 2018-06-30 MED ORDER — INFLUENZA VAC SPLIT HIGH-DOSE 0.5 ML IM SUSY
0.5000 mL | PREFILLED_SYRINGE | INTRAMUSCULAR | Status: DC
  Filled 2018-06-30: qty 0.5

## 2018-06-30 MED ORDER — PANTOPRAZOLE SODIUM 40 MG PO TBEC
40.0000 mg | DELAYED_RELEASE_TABLET | Freq: Every day | ORAL | Status: DC
  Administered 2018-07-01 – 2018-07-02 (×2): 40 mg via ORAL
  Filled 2018-06-30 (×2): qty 1

## 2018-06-30 MED ORDER — DILTIAZEM HCL ER COATED BEADS 240 MG PO CP24
240.0000 mg | ORAL_CAPSULE | Freq: Every day | ORAL | Status: DC
  Administered 2018-07-01 – 2018-07-02 (×2): 240 mg via ORAL
  Filled 2018-06-30 (×3): qty 1

## 2018-06-30 MED ORDER — METHYLPREDNISOLONE SODIUM SUCC 125 MG IJ SOLR
125.0000 mg | Freq: Once | INTRAMUSCULAR | Status: AC
  Administered 2018-06-30: 125 mg via INTRAVENOUS
  Filled 2018-06-30: qty 2

## 2018-06-30 MED ORDER — SODIUM CHLORIDE 0.9 % IV BOLUS
500.0000 mL | Freq: Once | INTRAVENOUS | Status: AC
  Administered 2018-06-30: 500 mL via INTRAVENOUS

## 2018-06-30 MED ORDER — ACETAMINOPHEN 650 MG RE SUPP: 650.0000 mg | Freq: Four times a day (QID) | RECTAL | Status: DC | PRN

## 2018-06-30 MED ORDER — SACCHAROMYCES BOULARDII 250 MG PO CAPS
250.0000 mg | ORAL_CAPSULE | Freq: Every day | ORAL | Status: DC
  Administered 2018-07-01 – 2018-07-02 (×2): 250 mg via ORAL
  Filled 2018-06-30 (×2): qty 1

## 2018-06-30 MED ORDER — ONDANSETRON HCL 4 MG PO TABS: 4.0000 mg | ORAL_TABLET | Freq: Four times a day (QID) | ORAL | Status: DC | PRN

## 2018-06-30 MED ORDER — LORATADINE 10 MG PO TABS
10.0000 mg | ORAL_TABLET | Freq: Every day | ORAL | Status: DC
  Administered 2018-07-01 – 2018-07-02 (×2): 10 mg via ORAL
  Filled 2018-06-30 (×2): qty 1

## 2018-06-30 MED ORDER — ACETAMINOPHEN 325 MG PO TABS
650.0000 mg | ORAL_TABLET | Freq: Four times a day (QID) | ORAL | Status: DC | PRN
  Administered 2018-06-30 – 2018-07-01 (×2): 650 mg via ORAL
  Filled 2018-06-30 (×2): qty 2

## 2018-06-30 MED ORDER — POTASSIUM CHLORIDE CRYS ER 20 MEQ PO TBCR
20.0000 meq | EXTENDED_RELEASE_TABLET | Freq: Two times a day (BID) | ORAL | Status: DC
  Administered 2018-06-30 – 2018-07-01 (×2): 20 meq via ORAL
  Filled 2018-06-30 (×2): qty 1

## 2018-06-30 MED ORDER — ONDANSETRON HCL 4 MG/2ML IJ SOLN: 4.0000 mg | Freq: Four times a day (QID) | INTRAMUSCULAR | Status: DC | PRN

## 2018-06-30 MED ORDER — FLUTICASONE PROPIONATE 50 MCG/ACT NA SUSP
1.0000 | Freq: Every day | NASAL | Status: DC
  Administered 2018-07-01 – 2018-07-02 (×2): 1 via NASAL
  Filled 2018-06-30: qty 16

## 2018-06-30 MED ORDER — SODIUM CHLORIDE 0.9 % IV SOLN
100.0000 mg | Freq: Once | INTRAVENOUS | Status: AC
  Administered 2018-06-30: 100 mg via INTRAVENOUS
  Filled 2018-06-30: qty 100

## 2018-06-30 MED ORDER — SODIUM CHLORIDE 0.9 % IV SOLN
INTRAVENOUS | Status: DC
  Administered 2018-07-01: via INTRAVENOUS

## 2018-06-30 MED ORDER — IPRATROPIUM-ALBUTEROL 0.5-2.5 (3) MG/3ML IN SOLN
3.0000 mL | RESPIRATORY_TRACT | Status: DC
  Administered 2018-06-30 – 2018-07-01 (×5): 3 mL via RESPIRATORY_TRACT
  Filled 2018-06-30 (×4): qty 3

## 2018-06-30 MED ORDER — CALCIUM CARBONATE-VITAMIN D 500-200 MG-UNIT PO TABS
2.0000 | ORAL_TABLET | Freq: Every day | ORAL | Status: DC
  Administered 2018-07-01 – 2018-07-02 (×2): 2 via ORAL
  Filled 2018-06-30 (×2): qty 2

## 2018-06-30 MED ORDER — BUDESONIDE 0.25 MG/2ML IN SUSP
0.2500 mg | Freq: Two times a day (BID) | RESPIRATORY_TRACT | Status: DC
  Administered 2018-06-30 – 2018-07-02 (×4): 0.25 mg via RESPIRATORY_TRACT
  Filled 2018-06-30 (×4): qty 2

## 2018-06-30 MED ORDER — VITAMIN C 500 MG PO TABS
1000.0000 mg | ORAL_TABLET | Freq: Every day | ORAL | Status: DC
  Administered 2018-07-01 – 2018-07-02 (×2): 1000 mg via ORAL
  Filled 2018-06-30 (×2): qty 2

## 2018-06-30 MED ORDER — TRIAMTERENE-HCTZ 37.5-25 MG PO TABS
1.0000 | ORAL_TABLET | Freq: Every day | ORAL | Status: DC
  Administered 2018-07-01: 09:00:00 1 via ORAL
  Filled 2018-06-30 (×2): qty 1

## 2018-06-30 MED ORDER — GUAIFENESIN-DM 100-10 MG/5ML PO SYRP
15.0000 mL | ORAL_SOLUTION | ORAL | Status: DC | PRN
  Filled 2018-06-30: qty 15

## 2018-06-30 MED ORDER — ETODOLAC 400 MG PO TABS
400.0000 mg | ORAL_TABLET | Freq: Every day | ORAL | Status: DC
  Administered 2018-07-01 – 2018-07-02 (×2): 400 mg via ORAL
  Filled 2018-06-30 (×3): qty 1

## 2018-06-30 MED ORDER — ENOXAPARIN SODIUM 40 MG/0.4ML ~~LOC~~ SOLN
40.0000 mg | SUBCUTANEOUS | Status: DC
  Administered 2018-06-30 – 2018-07-01 (×2): 40 mg via SUBCUTANEOUS
  Filled 2018-06-30 (×2): qty 0.4

## 2018-06-30 MED ORDER — IPRATROPIUM-ALBUTEROL 0.5-2.5 (3) MG/3ML IN SOLN
9.0000 mL | Freq: Once | RESPIRATORY_TRACT | Status: AC
  Administered 2018-06-30: 9 mL via RESPIRATORY_TRACT
  Filled 2018-06-30: qty 9

## 2018-06-30 NOTE — ED Provider Notes (Signed)
[facility] Emergency Department Provider Note  ____________________________________________   First MD Initiated Contact with Patient 06/30/18 1550     (approximate)  I have reviewed the triage vital signs and the nursing notes.   HISTORY  Chief Complaint Shortness of Breath   HPI [name] is a 84 y.o. female with a history of hypertension, arthritis and recent diagnosis of bronchitis who was presented to the emergency department with persistent shortness of breath and productive cough.  Denies any fever.  Says that she is completed a course of Levaquin as well as prednisone and her symptoms are worsening.  Brought in by EMS who found her to have an oxygen saturation in low 90s and placed her on supplemental nasal cannula oxygen.  Patient also states that she has had persistent lower extremity swelling but with negative DVTs in August.  Denies any pain at this time.  Past Medical History:  Diagnosis Date  . Arthritis   . Hypertension     Patient Active Problem List   Diagnosis Date Noted  . GERD (gastroesophageal reflux disease) 04/29/2018  . Hypertension 03/02/2018  . Arthritis 03/02/2018  . Pain in limb 03/02/2018  . HTN (hypertension), benign 01/08/2014    Past Surgical History:  Procedure Laterality Date  . APPENDECTOMY    . EYE SURGERY    . HYSTEROTOMY    . OOPHORECTOMY    . TONSILLECTOMY      Prior to Admission medications   Medication Sig Start Date End Date Taking? Authorizing Provider  Ascorbic Acid (VITAMIN C) 1000 MG tablet Take 1,000 mg by mouth daily.    [provider]  Calcium Carbonate-Vitamin D (CALCIUM HIGH POTENCY/VITAMIN D) 600-200 MG-UNIT TABS Take 2 tablets by mouth daily.     [provider]  diltiazem (CARTIA XT) 240 MG 24 hr capsule Take 240 mg by mouth daily.  02/02/18   [provider]  diphenoxylate-atropine (LOMOTIL) 2.5-0.025 MG tablet  05/03/18   [provider]  escitalopram  (LEXAPRO) 5 MG tablet Take 5 mg by mouth daily.  02/02/18   [provider]  etodolac (LODINE) 400 MG tablet Take 400 mg by mouth daily.  01/11/18 01/11/19  [provider]  fluticasone (FLONASE) 50 MCG/ACT nasal spray Place 1 spray into the nose daily as needed.  09/09/16   [provider]  loratadine (CLARITIN) 10 MG tablet Take 10 mg by mouth daily.     [provider]  nystatin (MYCOSTATIN) 100000 UNIT/ML suspension  04/06/18   [provider]  omeprazole (PRILOSEC) 20 MG capsule Take 20 mg by mouth daily.    [provider]  ondansetron (ZOFRAN ODT) 4 MG disintegrating tablet Take 1 tablet (4 mg total) by mouth every 8 (eight) hours as needed for nausea or vomiting. Patient not taking: Reported on 05/26/2018 03/13/18   Merlyn Lot, MD  potassium chloride SA (K-DUR,KLOR-CON) 20 MEQ tablet Take 20 mEq by mouth 2 (two) times daily.  02/02/18   [provider]  ranitidine (ZANTAC) 150 MG tablet Take by mouth.    [provider]  saccharomyces boulardii (FLORASTOR) 250 MG capsule Take 1 capsule by mouth daily.     [provider]  triamterene-hydrochlorothiazide (DYAZIDE) 37.5-25 MG capsule Take 1 capsule by mouth daily.  08/17/17   [provider]    Allergies Amoxicillin-pot clavulanate; Cefprozil; Clarithromycin; Prednisone; Pseudoephedrine; Penicillins; and Sulfa antibiotics  Family History  Problem Relation Age of Onset  . Dementia Mother   .  Heart attack Father     Social History Social History   Tobacco Use  . Smoking status: Never Smoker  . Smokeless tobacco: Never Used  Substance Use Topics  . Alcohol use: Never    Frequency: Never  . Drug use: Never    Review of Systems  Constitutional: No fever/chills Eyes: No visual changes. ENT: No sore throat. Cardiovascular: Denies chest pain. Respiratory: As above Gastrointestinal: No abdominal pain.  No nausea, no vomiting.  No diarrhea.   No constipation. Genitourinary: Negative for dysuria. Musculoskeletal: Negative for back pain. Skin: Negative for rash. Neurological: Negative for headaches, focal weakness or numbness.   ____________________________________________   PHYSICAL EXAM:  VITAL SIGNS: ED Triage Vitals [06/30/18 1608]  Enc Vitals Group     BP      Pulse      Resp      Temp      Temp src      SpO2 95 %     Weight      Height      Head Circumference      Peak Flow      Pain Score      Pain Loc      Pain Edu?      Excl. in [place]?     Constitutional: Alert and oriented. Well appearing and in no acute distress. Eyes: Conjunctivae are normal.  Head: Atraumatic. Nose: No congestion/rhinnorhea. Mouth/Throat: Mucous membranes are moist.  Neck: No stridor.   Cardiovascular: Normal rate, regular rhythm. Grossly normal heart sounds.   Respiratory: Normal respiratory effort.  No retractions.  Expiratory cough present.  Wheezing/rhonchi throughout all fields with a prolonged respiratory phase.  However, patient speaks in full sentences. Gastrointestinal: Soft and nontender. No distention. Musculoskeletal: Mild to moderate bilateral lower extremity edema without any erythema, induration or exudate.  No tenderness to palpation.   Neurologic:  Normal speech and language. No gross focal neurologic deficits are appreciated. Skin:  Skin is warm, dry and intact. No rash noted. Psychiatric: Mood and affect are normal. Speech and behavior are normal.  ____________________________________________   LABS (all labs ordered are listed, but only abnormal results are displayed)  Labs Reviewed  CBC WITH DIFFERENTIAL/PLATELET - Abnormal; Notable for the following components:      Result Value   WBC 11.2 (*)    RBC 3.74 (*)    Neutro Abs 9.1 (*)    Lymphs Abs 0.9 (*)    Monocytes Absolute 1.1 (*)    All other components within normal limits  BASIC METABOLIC PANEL - Abnormal; Notable for the following components:    Sodium 124 (*)    Chloride 88 (*)    Glucose, Bld 126 (*)    GFR calc non Af Amer 53 (*)    All other components within normal limits  TROPONIN I  BRAIN NATRIURETIC PEPTIDE   ____________________________________________  EKG  ED ECG REPORT I, Doran Stabler, the attending physician, personally viewed and interpreted this ECG.   Date: 06/30/2018  EKG Time: 1707  Rate: 85  Rhythm: normal sinus rhythm  Axis: Normal  Intervals:none  ST&T Change: No ST segment elevation or depression.  No abnormal T wave inversion.  ____________________________________________  RADIOLOGY  Linear scarring is noted.  No definitive active process. ____________________________________________   PROCEDURES  Procedure(s) performed:   .Critical Care Performed by: Orbie Pyo, MD Authorized by: Orbie Pyo, MD   Critical care provider statement:    Critical care time (minutes):  35  Critical care time was exclusive of:  Separately billable procedures and treating other patients   Critical care was necessary to treat or prevent imminent or life-threatening deterioration of the following conditions:  Metabolic crisis   Critical care was time spent personally by me on the following activities:  Development of treatment plan with patient or surrogate, discussions with consultants, evaluation of patient's response to treatment, examination of patient, obtaining history from patient or surrogate, ordering and performing treatments and interventions, ordering and review of laboratory studies, ordering and review of radiographic studies, pulse oximetry, re-evaluation of patient's condition and review of old charts    Critical Care performed:    ____________________________________________   INITIAL IMPRESSION / ASSESSMENT AND PLAN / ED COURSE  Pertinent labs & imaging results that were available during my care of the patient were reviewed by me and considered in my  medical decision making (see chart for details).  Differential includes, but is not limited to, viral syndrome, bronchitis including COPD exacerbation, pneumonia, reactive airway disease including asthma, CHF including exacerbation with or without pulmonary/interstitial edema, pneumothorax, ACS, thoracic trauma, and pulmonary embolism. As part of my medical decision making, I reviewed the following data within the [place] NUMBEROutpatient visits and Notes from prior ED visits  ----------------------------------------- 5:40 PM on 06/30/2018 -----------------------------------------  Patient at this time without respiratory distress but still with coarse wheezing throughout all fields.  Also found to have a sodium of 124.  Will give IV fluids.  To be admitted to the hospital.  Signed out to the hospitalist. ____________________________________________   FINAL CLINICAL IMPRESSION(S) / ED DIAGNOSES  Bronchitis.  Hyponatremia.  NEW MEDICATIONS STARTED DURING THIS VISIT:  New Prescriptions   No medications on file     Note:  This document was prepared using Dragon voice recognition software and may include unintentional dictation errors.     Orbie Pyo, MD 06/30/18 1740

## 2018-06-30 NOTE — ED Notes (Signed)
Pt assisted to bathroom by RN

## 2018-06-30 NOTE — Progress Notes (Signed)
Advanced care plan.  Purpose of the Encounter: CODE STATUS  Parties in Attendance: Patient herself  Patient's Decision Capacity: Intact  Subjective/Patient's story: Patient is 84 year old with history of recurrent bronchitis, allergic rhinitis, GERD, esophageal spasm presenting with shortness of breath   Objective/Medical story I discussed with patient regarding her desires for cardiac and pulmonary resuscitation   Goals of care determination: dnr   CODE STATUS:dnr  Time spent discussing advanced care planning: 16 minutes

## 2018-06-30 NOTE — H&P (Signed)
[facility] NAME: [name]    MR#:  [id number]  DATE OF BIRTH:  Nov 17, 1926  DATE OF ADMISSION:  06/30/2018  PRIMARY CARE PHYSICIAN: Rusty Aus, MD   REQUESTING/REFERRING PHYSICIAN:  Orbie Pyo, MD     CHIEF COMPLAINT:   Chief Complaint  Patient presents with  . Shortness of Breath    HISTORY OF PRESENT ILLNESS: [name]  is a 84 y.o. female with a known history of  Recurrent bronchitis, allergic rhinitis, edema, esophageal spasm, GERD who is having cough and shortness of breath ongoing for 1 week.  Patient states that she has been having wheezing and cough.  She states that she is having yellow productive cough.  She was seen by her primary care provider and started on antibiotics and oral steroids and inhalers with no improvement.  She continues to be short of breath and having significant wheezing.  Chest x-ray shows bronchitic changes.   PAST MEDICAL HISTORY:   Past Medical History:  Diagnosis Date  . Allergic rhinitis   . Arthritis   . Colon polyp   . Edema   . Esophageal spasm   . GERD (gastroesophageal reflux disease)   . Hypertension     PAST SURGICAL HISTORY:  Past Surgical History:  Procedure Laterality Date  . APPENDECTOMY    . CATARACT EXTRACTION    . dilatation and curatage    . EYE SURGERY    . HYSTEROTOMY    . OOPHORECTOMY    . TONSILLECTOMY      SOCIAL HISTORY:  Social History   Tobacco Use  . Smoking status: Never Smoker  . Smokeless tobacco: Never Used  Substance Use Topics  . Alcohol use: Never    Frequency: Never    FAMILY HISTORY:  Family History  Problem Relation Age of Onset  . Dementia Mother   . Heart attack Father     DRUG ALLERGIES:  Allergies  Allergen Reactions  . Amoxicillin-Pot Clavulanate Nausea Only  . Cefprozil Nausea Only  . Clarithromycin Nausea Only  . Prednisone     Other reaction(s): Other (See Comments) Hyperglycemia  . Pseudoephedrine      Other reaction(s): Unknown  . Penicillins Rash  . Sulfa Antibiotics Rash    REVIEW OF SYSTEMS:   CONSTITUTIONAL: No fever, fatigue or weakness.  EYES: No blurred or double vision.  EARS, NOSE, AND THROAT: No tinnitus or ear pain.  RESPIRATORY: +cough, + shortness of breath,+  wheezing or hemoptysis.  CARDIOVASCULAR: No chest pain, orthopnea, edema.  GASTROINTESTINAL: No nausea, vomiting, diarrhea or abdominal pain.  GENITOURINARY: No dysuria, hematuria.  ENDOCRINE: No polyuria, nocturia,  HEMATOLOGY: No anemia, easy bruising or bleeding SKIN: No rash or lesion. MUSCULOSKELETAL: No joint pain or arthritis.   NEUROLOGIC: No tingling, numbness, weakness.  PSYCHIATRY: No anxiety or depression.   MEDICATIONS AT HOME:  Prior to Admission medications   Medication Sig Start Date End Date Taking? Authorizing Provider  Ascorbic Acid (VITAMIN C) 1000 MG tablet Take 1,000 mg by mouth daily.    [provider]  Calcium Carbonate-Vitamin D (CALCIUM HIGH POTENCY/VITAMIN D) 600-200 MG-UNIT TABS Take 2 tablets by mouth daily.     [provider]  diltiazem (CARTIA XT) 240 MG 24 hr capsule Take 240 mg by mouth daily.  02/02/18   [provider]  diphenoxylate-atropine (LOMOTIL) 2.5-0.025 MG tablet  05/03/18   [provider]  escitalopram (LEXAPRO) 5 MG tablet Take 5 mg  by mouth daily.  02/02/18   [provider]  etodolac (LODINE) 400 MG tablet Take 400 mg by mouth daily.  01/11/18 01/11/19  [provider]  fluticasone (FLONASE) 50 MCG/ACT nasal spray Place 1 spray into the nose daily as needed.  09/09/16   [provider]  loratadine (CLARITIN) 10 MG tablet Take 10 mg by mouth daily.     [provider]  nystatin (MYCOSTATIN) 100000 UNIT/ML suspension  04/06/18   [provider]  omeprazole (PRILOSEC) 20 MG capsule Take 20 mg by mouth daily.    [provider]  ondansetron (ZOFRAN ODT) 4 MG disintegrating tablet  Take 1 tablet (4 mg total) by mouth every 8 (eight) hours as needed for nausea or vomiting. Patient not taking: Reported on 05/26/2018 03/13/18   Merlyn Lot, MD  potassium chloride SA (K-DUR,KLOR-CON) 20 MEQ tablet Take 20 mEq by mouth 2 (two) times daily.  02/02/18   [provider]  ranitidine (ZANTAC) 150 MG tablet Take by mouth.    [provider]  saccharomyces boulardii (FLORASTOR) 250 MG capsule Take 1 capsule by mouth daily.     [provider]  triamterene-hydrochlorothiazide (DYAZIDE) 37.5-25 MG capsule Take 1 capsule by mouth daily.  08/17/17   [provider]      PHYSICAL EXAMINATION:   VITAL SIGNS: Blood pressure (!) 158/60, pulse 87, temperature 98.6 F (37 C), temperature source Oral, resp. rate 18, height 5\' 5"  (1.651 m), weight 70.3 kg, SpO2 100 %.  GENERAL:  84 y.o.-year-old patient lying in the bed with no acute distress.  EYES: Pupils equal, round, reactive to light and accommodation. No scleral icterus. Extraocular muscles intact.  HEENT: Head atraumatic, normocephalic. Oropharynx and nasopharynx clear.  NECK:  Supple, no jugular venous distention. No thyroid enlargement, no tenderness.  LUNGS: + Bilateral wheezing throughout both lung CARDIOVASCULAR: S1, S2 normal. No murmurs, rubs, or gallops.  ABDOMEN: Soft, nontender, nondistended. Bowel sounds present. No organomegaly or mass.  EXTREMITIES: No pedal edema, cyanosis, or clubbing.  NEUROLOGIC: Cranial nerves II through XII are intact. Muscle strength 5/5 in all extremities. Sensation intact. Gait not checked.  PSYCHIATRIC: The patient is alert and oriented x 3.  SKIN: No obvious rash, lesion, or ulcer.   LABORATORY PANEL:   CBC Recent Labs  Lab 06/30/18 1636  WBC 11.2*  HGB 12.4  HCT 35.0  PLT 288  MCV 93.8  MCH 33.3  MCHC 35.5  RDW 13.2  LYMPHSABS 0.9*  MONOABS 1.1*  EOSABS 0.1  BASOSABS 0.0    ------------------------------------------------------------------------------------------------------------------  Chemistries  Recent Labs  Lab 06/30/18 1636  NA 124*  K 3.6  CL 88*  CO2 27  GLUCOSE 126*  BUN 18  CREATININE 0.93  CALCIUM 9.0   ------------------------------------------------------------------------------------------------------------------ estimated creatinine clearance is 40.3 mL/min (by C-G formula based on SCr of 0.93 mg/dL). ------------------------------------------------------------------------------------------------------------------ No results for input(s): TSH, T4TOTAL, T3FREE, THYROIDAB in the last 72 hours.  Invalid input(s): FREET3   Coagulation profile No results for input(s): INR, PROTIME in the last 168 hours. ------------------------------------------------------------------------------------------------------------------- No results for input(s): DDIMER in the last 72 hours. -------------------------------------------------------------------------------------------------------------------  Cardiac Enzymes Recent Labs  Lab 06/30/18 1636  TROPONINI <0.03   ------------------------------------------------------------------------------------------------------------------ Invalid input(s): POCBNP  ---------------------------------------------------------------------------------------------------------------  Urinalysis    Component Value Date/Time   COLORURINE STRAW (A) 03/13/2018 1848   APPEARANCEUR CLEAR (A) 03/13/2018 1848   APPEARANCEUR Clear 02/20/2013 1404   LABSPEC 1.006 03/13/2018 1848   LABSPEC 1.006 02/20/2013 1404   PHURINE 5.0 03/13/2018 1848  GLUCOSEU NEGATIVE 03/13/2018 1848   GLUCOSEU >=500 02/20/2013 1404   HGBUR NEGATIVE 03/13/2018 1848   BILIRUBINUR NEGATIVE 03/13/2018 1848   BILIRUBINUR Negative 02/20/2013 1404   KETONESUR NEGATIVE 03/13/2018 1848   PROTEINUR NEGATIVE 03/13/2018 1848   NITRITE NEGATIVE  03/13/2018 1848   LEUKOCYTESUR NEGATIVE 03/13/2018 1848   LEUKOCYTESUR Negative 02/20/2013 1404     RADIOLOGY: Dg Chest 2 View  Result Date: 06/30/2018 CLINICAL DATA:  Shortness of breath, cough EXAM: CHEST - 2 VIEW COMPARISON:  Chest x-ray of 01/23/2015 FINDINGS: Linear scarring is unchanged on the lateral view and the lingula with some involvement of the right upper lobe as well. No new infiltrate or effusion is seen. The lungs remain somewhat hyperaerated. There is mild peribronchial cuffing which can be seen with bronchitis. Mild cardiomegaly is stable. No acute bony abnormality is seen. IMPRESSION: 1. Linear scarring as noted above.  No definite active process. 2. Peribronchial thickening may indicate bronchitis. Electronically Signed   By: Ivar Drape M.D.   On: 06/30/2018 16:24    EKG: Orders placed or performed during the hospital encounter of 06/30/18  . EKG 12-Lead  . EKG 12-Lead    IMPRESSION AND PLAN: Patient is 84 year old with recurrent bronchitis positive shortness of  1.  Acute bronchitis with bronchospasm-Place patient on nebulizer therapy, IV Solu-Medrol and cough suppressants  2.  Hyponatremia-we will give IV fluids patient's sodium was normal previously.  Due to diuretic therapy she is on.  3.  Depression anxiety continue Lexapro  4.  Seasonal allergies continue Flonase and Claritin  5.  GERD continue Prilosec  6.  Misc: lovenox    All the records are reviewed and case discussed with ED provider. Management plans discussed with the patient, family and they are in agreement.  CODE STATUS:dnr    TOTAL TIME TAKING CARE OF THIS PATIENT: 55 minutes.    Dustin Flock M.D on 06/30/2018 at 6:13 PM  Between 7am to 6pm - Pager - [phone]  After 6pm go to [email or web address] - password [email or web address]  [facility] Office  [phone]  CC: Primary care physician; Rusty Aus, MD

## 2018-06-30 NOTE — ED Triage Notes (Addendum)
Pt BIB [facility] from home for SOB. Pt recently completed levaquin & prednisone but states cough & SOB is getting worse. Pt placed on 2L via [place] as 88% on RA. Here pt O2 sat on RA 94-95%. Pt a & o x3. ABCs intact. NAD.

## 2018-06-30 NOTE — ED Notes (Signed)
Pt to XR

## 2018-07-01 DIAGNOSIS — J209 Acute bronchitis, unspecified: Secondary | ICD-10-CM | POA: Diagnosis not present

## 2018-07-01 DIAGNOSIS — E871 Hypo-osmolality and hyponatremia: Secondary | ICD-10-CM | POA: Diagnosis not present

## 2018-07-01 DIAGNOSIS — F329 Major depressive disorder, single episode, unspecified: Secondary | ICD-10-CM | POA: Diagnosis not present

## 2018-07-01 DIAGNOSIS — J9801 Acute bronchospasm: Secondary | ICD-10-CM | POA: Diagnosis not present

## 2018-07-01 LAB — CBC
HCT: 35.1 % (ref 35.0–47.0)
Hemoglobin: 12.5 g/dL (ref 12.0–16.0)
MCH: 33.2 pg (ref 26.0–34.0)
MCHC: 35.6 g/dL (ref 32.0–36.0)
MCV: 93.3 fL (ref 80.0–100.0)
PLATELETS: 278 10*3/uL (ref 150–440)
RBC: 3.76 MIL/uL — AB (ref 3.80–5.20)
RDW: 13 % (ref 11.5–14.5)
WBC: 7 10*3/uL (ref 3.6–11.0)

## 2018-07-01 LAB — BASIC METABOLIC PANEL
ANION GAP: 9 (ref 5–15)
BUN: 16 mg/dL (ref 8–23)
CALCIUM: 9.4 mg/dL (ref 8.9–10.3)
CO2: 25 mmol/L (ref 22–32)
Chloride: 97 mmol/L — ABNORMAL LOW (ref 98–111)
Creatinine, Ser: 0.82 mg/dL (ref 0.44–1.00)
GFR calc non Af Amer: >60 mL/min (ref >60)
Glucose, Bld: 220 mg/dL — ABNORMAL HIGH (ref 70–99)
POTASSIUM: 3.8 mmol/L (ref 3.5–5.1)
SODIUM: 131 mmol/L — AB (ref 135–145)

## 2018-07-01 LAB — GLUCOSE, CAPILLARY: GLUCOSE-CAPILLARY: 208 mg/dL — AB (ref 70–99)

## 2018-07-01 MED ORDER — DOXYCYCLINE HYCLATE 100 MG PO TABS
100.0000 mg | ORAL_TABLET | Freq: Two times a day (BID) | ORAL | Status: DC
  Administered 2018-07-02: 08:00:00 100 mg via ORAL
  Filled 2018-07-01 (×2): qty 1

## 2018-07-01 MED ORDER — METHYLPREDNISOLONE SODIUM SUCC 125 MG IJ SOLR
60.0000 mg | Freq: Every day | INTRAMUSCULAR | Status: DC
  Administered 2018-07-02: 60 mg via INTRAVENOUS
  Filled 2018-07-01: qty 2

## 2018-07-01 MED ORDER — ESCITALOPRAM OXALATE 10 MG PO TABS
5.0000 mg | ORAL_TABLET | Freq: Two times a day (BID) | ORAL | Status: DC
  Administered 2018-07-02: 5 mg via ORAL
  Filled 2018-07-01 (×3): qty 0.5

## 2018-07-01 MED ORDER — AMLODIPINE BESYLATE 5 MG PO TABS: 5.0000 mg | ORAL_TABLET | Freq: Every day | ORAL | Status: DC

## 2018-07-01 MED ORDER — DOXYCYCLINE HYCLATE 100 MG PO TABS
100.0000 mg | ORAL_TABLET | Freq: Two times a day (BID) | ORAL | Status: DC
  Administered 2018-07-01: 100 mg via ORAL
  Filled 2018-07-01 (×3): qty 1

## 2018-07-01 MED ORDER — IPRATROPIUM-ALBUTEROL 0.5-2.5 (3) MG/3ML IN SOLN
3.0000 mL | Freq: Four times a day (QID) | RESPIRATORY_TRACT | Status: DC
  Administered 2018-07-01 – 2018-07-02 (×4): 3 mL via RESPIRATORY_TRACT
  Filled 2018-07-01 (×4): qty 3

## 2018-07-01 MED ORDER — ATENOLOL 25 MG PO TABS
25.0000 mg | ORAL_TABLET | Freq: Every day | ORAL | Status: DC
  Administered 2018-07-01 – 2018-07-02 (×2): 25 mg via ORAL
  Filled 2018-07-01 (×2): qty 1

## 2018-07-01 NOTE — Evaluation (Signed)
Physical Therapy Evaluation Patient Details Name: [name] MRN: [id number] DOB: 10/22/1926 Today's Date: 07/01/2018   History of Present Illness  84 y/o female here with shortness of breath.  She was here a few months ago with similar issues.    Clinical Impression  Pt did well with PT exam.  She was able to ambulate around the nurses' station on room air and a majority of the time did so w/o AD.  She had no LOBs, no c/o excessive fatigue and on room air her O2 remained in the mid 90s with the effort.  Overall she did well and agrees that she is close enough to baseline to go home w/o further PT intervention, encouraged her to gradually build up to her normally quite active baseline activity level. No further PT needs at this time.     Follow Up Recommendations No PT follow up    Equipment Recommendations  None recommended by PT    Recommendations for Other Services       Precautions / Restrictions Precautions Precautions: Fall Restrictions Weight Bearing Restrictions: No      Mobility  Bed Mobility Overal bed mobility: Independent             General bed mobility comments: easily gets to sitting EOB w/o assist  Transfers Overall transfer level: Independent Equipment used: Rolling walker (2 wheeled)             General transfer comment: Pt able to rise and maintain balance w/o heavy use of AD  Ambulation/Gait Ambulation/Gait assistance: Modified independent (Device/Increase time) Gait Distance (Feet): 200 Feet Assistive device: Rolling walker (2 wheeled);None       General Gait Details: ~50 ft with FWW and then ~150 ft w/o AD close to hallway rail.  Pt with consistent cadence, mild changes in vitals (O2 remained in the mid 90s, HR 100-110)  Stairs            Wheelchair Mobility    Modified Rankin (Stroke Patients Only)       Balance Overall balance assessment: No apparent balance deficits (not formally assessed)                                           Pertinent Vitals/Pain      Home Living Family/patient expects to be discharged to:: Private residence Living Arrangements: Alone Available Help at Discharge: Family   Home Access: Stairs to enter Entrance Stairs-Rails: None Entrance Stairs-Number of Steps: 1 small step Home Layout: One level Home Equipment: Environmental consultant - 2 wheels;Cane - single point      Prior Function Level of Independence: Independent;Independent with assistive device(s)         Comments: Pt has been using walker the last week secondary to shortness of breath.  She is able to drive/run errands independently, goes out of eat with family/friend multiple timesx/wk.     Hand Dominance        Extremity/Trunk Assessment   Upper Extremity Assessment Upper Extremity Assessment: Overall WFL for tasks assessed(age appropriate limitations)    Lower Extremity Assessment Lower Extremity Assessment: Overall WFL for tasks assessed(age appropriate limitations)       Communication   Communication: No difficulties  Cognition Arousal/Alertness: Awake/alert Behavior During Therapy: WFL for tasks assessed/performed Overall Cognitive Status: Within Functional Limits for tasks assessed  General Comments      Exercises     Assessment/Plan    PT Assessment Patent does not need any further PT services  PT Problem List         PT Treatment Interventions      PT Goals (Current goals can be found in the Care Plan section)  Acute Rehab PT Goals Patient Stated Goal: go home PT Goal Formulation: All assessment and education complete, DC therapy    Frequency     Barriers to discharge        Co-evaluation               AM-PAC PT "6 Clicks" Daily Activity  Outcome Measure Difficulty turning over in bed (including adjusting bedclothes, sheets and blankets)?: None Difficulty moving from lying on back to sitting on the  side of the bed? : None Difficulty sitting down on and standing up from a chair with arms (e.g., wheelchair, bedside commode, etc,.)?: None Help needed moving to and from a bed to chair (including a wheelchair)?: None Help needed walking in hospital room?: None Help needed climbing 3-5 steps with a railing? : None 6 Click Score: 24    End of Session Equipment Utilized During Treatment: Gait belt Activity Tolerance: Patient tolerated treatment well Patient left: with chair alarm set;with call bell/phone within reach Nurse Communication: Mobility status      Time: 5374-8270 PT Time Calculation (min) (ACUTE ONLY): 20 min   Charges:   PT Evaluation $PT Eval Low Complexity: 1 Low          Kreg Shropshire, DPT 07/01/2018, 4:52 PM

## 2018-07-01 NOTE — Care Management Note (Signed)
Case Management Note  Patient Details  Name: Domonique Cothran MRN: [id number] Date of Birth: [birth year]  Subjective/Objective:                  Admitted to [facility] under observation status with the diagnosis of shortness of breath, Lives alone. Resa Miner [phone]). Prescriptions are filled at [facility]. No home health. No skilled nursing. No home oxygen. Rolling walker, cane, bedside commode in the home. [employer] in the home. Takes care of all basic activities of daily living herself, drives. Last fall was August 24th, [place]. Niece will transport  Action/Plan: Will continue to follow for discharge plans, if needed   Expected Discharge Date:                  Expected Discharge Plan:     In-House Referral:   yes  Discharge planning Services   yes  Post Acute Care Choice:    Choice offered to:     DME Arranged:    DME Agency:     HH Arranged:    [place] Agency:     Status of Service:     If discussed at [employer] of Stay Meetings, dates discussed:    Additional Comments:  Shelbie Ammons, RN MSN CCM Care Management [phone] 07/01/2018, 11:24 AM

## 2018-07-01 NOTE — Care Management Obs Status (Signed)
[place] NOTIFICATION   Patient Details  Name: [name] MRN: [id number] Date of Birth: 1927-08-08   Medicare Observation Status Notification Given:  Yes    Shelbie Ammons, RN 07/01/2018, 11:02 AM

## 2018-07-01 NOTE — Progress Notes (Signed)
[place] [facility] [place] NAME: [name]    MR#:  [id number]  DATE OF BIRTH:  23-Dec-1926  SUBJECTIVE:  CHIEF COMPLAINT:   Chief Complaint  Patient presents with  . Shortness of Breath   -Lives at home by herself.  Came in with difficulty breathing and wheezing.  Slowly improving -On room air  REVIEW OF SYSTEMS:  Review of Systems  Constitutional: Positive for malaise/fatigue. Negative for chills and fever.  HENT: Positive for hearing loss. Negative for congestion, ear discharge and nosebleeds.   Eyes: Negative for blurred vision and double vision.  Respiratory: Negative for cough, shortness of breath and wheezing.   Cardiovascular: Negative for chest pain, palpitations and leg swelling.  Gastrointestinal: Negative for abdominal pain, constipation, diarrhea, nausea and vomiting.  Genitourinary: Negative for dysuria.  Musculoskeletal: Negative for myalgias.  Neurological: Negative for dizziness, focal weakness, seizures, weakness and headaches.  Psychiatric/Behavioral: Negative for depression.    DRUG ALLERGIES:   Allergies  Allergen Reactions  . Amoxicillin-Pot Clavulanate Nausea Only  . Cefprozil Nausea Only  . Clarithromycin Nausea Only  . Prednisone     Other reaction(s): Other (See Comments) Hyperglycemia  . Pseudoephedrine     Other reaction(s): Unknown  . Penicillins Rash  . Sulfa Antibiotics Rash    VITALS:  Blood pressure (!) 154/85, pulse 95, temperature 98.1 F (36.7 C), temperature source Oral, resp. rate 18, height 5' 5.5" (1.664 m), weight 65.5 kg, SpO2 95 %.  PHYSICAL EXAMINATION:  Physical Exam  GENERAL:  84 y.o.-year-old elderly patient lying in the bed with no acute distress.  EYES: Pupils equal, round, reactive to light and accommodation. No scleral icterus. Extraocular muscles intact.  HEENT: Head atraumatic, normocephalic. Oropharynx and nasopharynx clear.  NECK:  Supple, no jugular venous distention.  No thyroid enlargement, no tenderness.  LUNGS: coarse breath sounds bilaterally, scattered expiratory wheezing, no rales,rhonchi or crepitation. No use of accessory muscles of respiration. Decreased bibasilar breath sounds CARDIOVASCULAR: S1, S2 normal. No  rubs, or gallops. 3/6 systolic murmur present ABDOMEN: Soft, nontender, nondistended. Bowel sounds present. No organomegaly or mass.  EXTREMITIES: No pedal edema, cyanosis, or clubbing.  NEUROLOGIC: Cranial nerves II through XII are intact. Muscle strength 5/5 in all extremities. Sensation intact. Gait not checked. Global weakness noted. PSYCHIATRIC: The patient is alert and oriented x 3.  SKIN: No obvious rash, lesion, or ulcer.    LABORATORY PANEL:   CBC Recent Labs  Lab 07/01/18 0441  WBC 7.0  HGB 12.5  HCT 35.1  PLT 278   ------------------------------------------------------------------------------------------------------------------  Chemistries  Recent Labs  Lab 07/01/18 0441  NA 131*  K 3.8  CL 97*  CO2 25  GLUCOSE 220*  BUN 16  CREATININE 0.82  CALCIUM 9.4   ------------------------------------------------------------------------------------------------------------------  Cardiac Enzymes Recent Labs  Lab 06/30/18 1636  TROPONINI <0.03   ------------------------------------------------------------------------------------------------------------------  RADIOLOGY:  Dg Chest 2 View  Result Date: 06/30/2018 CLINICAL DATA:  Shortness of breath, cough EXAM: CHEST - 2 VIEW COMPARISON:  Chest x-ray of 01/23/2015 FINDINGS: Linear scarring is unchanged on the lateral view and the lingula with some involvement of the right upper lobe as well. No new infiltrate or effusion is seen. The lungs remain somewhat hyperaerated. There is mild peribronchial cuffing which can be seen with bronchitis. Mild cardiomegaly is stable. No acute bony abnormality is seen. IMPRESSION: 1. Linear scarring as noted above.  No definite active  process. 2. Peribronchial thickening may indicate bronchitis. Electronically Signed  By: Ivar Drape M.D.   On: 06/30/2018 16:24    EKG:   Orders placed or performed during the hospital encounter of 06/30/18  . EKG 12-Lead  . EKG 12-Lead    ASSESSMENT AND PLAN:   84 year old female with past medical history significant for rhinitis, GERD, esophageal spasms presented to hospital secondary to worsening shortness of breath and cough  1.  Acute bronchitis with reactive airway disease-chest x-ray confirming bronchitis -Failed outpatient prednisone and oral Levaquin for the past week. -Continue IV steroids, decrease the frequency.  Started on doxycycline -Incentive spirometry.  Off oxygen at this time  2.  Hyponatremia-improved with IV fluids.  Continue to monitor -Discontinue hydrochlorothiazide  3.  Hypertension-on triamterene hydrochlorothiazide.  Discontinued hydrochlorothiazide due to hyponatremia  4.  Hypertension-on Cardizem  5.  Depression-Lexapro  6.  DVT prophylaxis-Lovenox   All the records are reviewed and case discussed with Care Management/Social Workerr. Management plans discussed with the patient, family and they are in agreement.  CODE STATUS: DNR  TOTAL TIME TAKING CARE OF THIS PATIENT: 38 minutes.   POSSIBLE D/C IN 1-2 DAYS, DEPENDING ON CLINICAL CONDITION.   Isaiah Torok M.D on 07/01/2018 at 2:22 PM  Between 7am to 6pm - Pager - [phone]  After 6pm go to [email or web address] - password [email or web address] [place] [facility]  Office  [phone]  CC: Primary care physician; Rusty Aus, MD

## 2018-07-02 DIAGNOSIS — F329 Major depressive disorder, single episode, unspecified: Secondary | ICD-10-CM | POA: Diagnosis not present

## 2018-07-02 DIAGNOSIS — J209 Acute bronchitis, unspecified: Secondary | ICD-10-CM | POA: Diagnosis not present

## 2018-07-02 DIAGNOSIS — J9801 Acute bronchospasm: Secondary | ICD-10-CM | POA: Diagnosis not present

## 2018-07-02 DIAGNOSIS — E871 Hypo-osmolality and hyponatremia: Secondary | ICD-10-CM | POA: Diagnosis not present

## 2018-07-02 MED ORDER — PREDNISONE 20 MG PO TABS: 40.0000 mg | ORAL_TABLET | Freq: Every day | ORAL | 0 refills | Status: AC

## 2018-07-02 MED ORDER — HYDROCOD POLST-CPM POLST ER 10-8 MG/5ML PO SUER: 5.0000 mL | Freq: Two times a day (BID) | ORAL | 0 refills | Status: DC | PRN

## 2018-07-02 MED ORDER — DOXYCYCLINE HYCLATE 100 MG PO TABS: 100.0000 mg | ORAL_TABLET | Freq: Two times a day (BID) | ORAL | 0 refills | Status: AC

## 2018-07-02 MED ORDER — ATENOLOL 25 MG PO TABS: 25.0000 mg | ORAL_TABLET | Freq: Every day | ORAL | 0 refills | Status: DC

## 2018-07-02 NOTE — Discharge Summary (Signed)
[place] [facility] [place] NAME: [name]    MR#:  [id number]  DATE OF BIRTH:  July 18, 1927  DATE OF ADMISSION:  06/30/2018   ADMITTING PHYSICIAN: Dustin Flock, MD  DATE OF DISCHARGE:  07/02/18  PRIMARY CARE PHYSICIAN: Rusty Aus, MD   ADMISSION DIAGNOSIS:   Hyponatremia [E87.1] Bronchitis [J40]  DISCHARGE DIAGNOSIS:   Active Problems:   SOB (shortness of breath)   SECONDARY DIAGNOSIS:   Past Medical History:  Diagnosis Date  . Allergic rhinitis   . Arthritis   . Colon polyp   . Edema   . Esophageal spasm   . GERD (gastroesophageal reflux disease)   . Hypertension     HOSPITAL COURSE:   84 year old female with past medical history significant for rhinitis, GERD, esophageal spasms presented to hospital secondary to worsening shortness of breath and cough  1.  Acute bronchitis with reactive airway disease-chest x-ray confirming bronchitis -Failed outpatient prednisone and oral Levaquin for the past week. -received IV steroids,.  Started on doxycycline -Incentive spirometry.  Off oxygen at this time - discharge on prednisone and dozycycline  2.  Hyponatremia-improved with IV fluids.  Continue to monitor -also Discontinued hydrochlorothiazide  3.  Hypertension-on triamterene hydrochlorothiazide.  Discontinued hydrochlorothiazide due to hyponatremia Continue cardizem and added low dose atenolol  5.  Depression-Lexapro  Ambulated well with physical therapy.  No needs identified Discharge today      DISCHARGE CONDITIONS:   Guarded  CONSULTS OBTAINED:   None  DRUG ALLERGIES:   Allergies  Allergen Reactions  . Amoxicillin-Pot Clavulanate Nausea Only  . Cefprozil Nausea Only  . Clarithromycin Nausea Only  . Prednisone     Other reaction(s): Other (See Comments) Hyperglycemia  . Pseudoephedrine     Other reaction(s): Unknown  . Penicillins Rash  . Sulfa Antibiotics Rash   DISCHARGE MEDICATIONS:     Allergies as of 07/02/2018      Reactions   Amoxicillin-pot Clavulanate Nausea Only   Cefprozil Nausea Only   Clarithromycin Nausea Only   Prednisone    Other reaction(s): Other (See Comments) Hyperglycemia   Pseudoephedrine    Other reaction(s): Unknown   Penicillins Rash   Sulfa Antibiotics Rash      Medication List    STOP taking these medications   levofloxacin 500 MG tablet Commonly known as:  LEVAQUIN   potassium chloride SA 20 MEQ tablet Commonly known as:  K-DUR,KLOR-CON   triamterene-hydrochlorothiazide 37.5-25 MG capsule Commonly known as:  DYAZIDE     TAKE these medications   atenolol 25 MG tablet Commonly known as:  TENORMIN Take 1 tablet (25 mg total) by mouth daily.   CALCIUM HIGH POTENCY/VITAMIN D 600-200 MG-UNIT Tabs Generic drug:  Calcium Carbonate-Vitamin D Take 2 tablets by mouth daily.   CARTIA XT 240 MG 24 hr capsule Generic drug:  diltiazem Take 240 mg by mouth daily.   chlorpheniramine-HYDROcodone 10-8 MG/5ML Suer Commonly known as:  TUSSIONEX Take 5 mLs by mouth every 12 (twelve) hours as needed for cough.   doxycycline 100 MG tablet Commonly known as:  VIBRA-TABS Take 1 tablet (100 mg total) by mouth every 12 (twelve) hours for 7 days.   escitalopram 5 MG tablet Commonly known as:  LEXAPRO Take 5 mg by mouth daily.   fluticasone 50 MCG/ACT nasal spray Commonly known as:  FLONASE Place 1 spray into the nose daily as needed.   loratadine 10 MG tablet Commonly known as:  CLARITIN Take 10 mg  by mouth daily.   omeprazole 20 MG capsule Commonly known as:  PRILOSEC Take 20 mg by mouth daily.   ondansetron 4 MG disintegrating tablet Commonly known as:  ZOFRAN-ODT Take 1 tablet (4 mg total) by mouth every 8 (eight) hours as needed for nausea or vomiting.   OPTIVE 0.5-0.9 % ophthalmic solution Generic drug:  carboxymethylcellul-glycerin Place 1 drop into both eyes 4 (four) times daily as needed for dry eyes.   predniSONE 20 MG  tablet Commonly known as:  DELTASONE Take 2 tablets (40 mg total) by mouth daily for 5 days.        DISCHARGE INSTRUCTIONS:   1. PCP f/u in 1 week  DIET:   Cardiac diet  ACTIVITY:   Activity as tolerated  OXYGEN:   Home Oxygen: No.  Oxygen Delivery: room air  DISCHARGE LOCATION:   home   If you experience worsening of your admission symptoms, develop shortness of breath, life threatening emergency, suicidal or homicidal thoughts you must seek medical attention immediately by calling 911 or calling your MD immediately  if symptoms less severe.  You Must read complete instructions/literature along with all the possible adverse reactions/side effects for all the Medicines you take and that have been prescribed to you. Take any new Medicines after you have completely understood and accpet all the possible adverse reactions/side effects.   Please note  You were cared for by a hospitalist during your hospital stay. If you have any questions about your discharge medications or the care you received while you were in the hospital after you are discharged, you can call the unit and asked to speak with the hospitalist on call if the hospitalist that took care of you is not available. Once you are discharged, your primary care physician will handle any further medical issues. Please note that NO REFILLS for any discharge medications will be authorized once you are discharged, as it is imperative that you return to your primary care physician (or establish a relationship with a primary care physician if you do not have one) for your aftercare needs so that they can reassess your need for medications and monitor your lab values.    On the day of Discharge:  VITAL SIGNS:   Blood pressure (!) 130/52, pulse (!) 104, temperature 98.1 F (36.7 C), temperature source Oral, resp. rate 16, height 5' 5.5" (1.664 m), weight 65.5 kg, SpO2 98 %.  PHYSICAL EXAMINATION:   GENERAL:  84  y.o.-year-old elderly patient lying in the bed with no acute distress.  EYES: Pupils equal, round, reactive to light and accommodation. No scleral icterus. Extraocular muscles intact.  HEENT: Head atraumatic, normocephalic. Oropharynx and nasopharynx clear.  NECK:  Supple, no jugular venous distention. No thyroid enlargement, no tenderness.  LUNGS: coarse breath sounds bilaterally, minimal expiratory wheezing, no rales,rhonchi or crepitation. No use of accessory muscles of respiration. Decreased bibasilar breath sounds CARDIOVASCULAR: S1, S2 normal. No  rubs, or gallops. 3/6 systolic murmur present ABDOMEN: Soft, nontender, nondistended. Bowel sounds present. No organomegaly or mass.  EXTREMITIES: No pedal edema, cyanosis, or clubbing.  NEUROLOGIC: Cranial nerves II through XII are intact. Muscle strength 5/5 in all extremities. Sensation intact. Gait not checked. Global weakness noted. PSYCHIATRIC: The patient is alert and oriented x 3.  SKIN: No obvious rash, lesion, or ulcer.   DATA REVIEW:   CBC Recent Labs  Lab 07/01/18 0441  WBC 7.0  HGB 12.5  HCT 35.1  PLT 278    Chemistries  Recent Labs  Lab 07/01/18 0441  NA 131*  K 3.8  CL 97*  CO2 25  GLUCOSE 220*  BUN 16  CREATININE 0.82  CALCIUM 9.4     Microbiology Results  Results for orders placed or performed during the hospital encounter of 03/13/18  C difficile quick scan w PCR reflex     Status: None   Collection Time: 03/13/18  6:48 PM  Result Value Ref Range Status   C Diff antigen NEGATIVE NEGATIVE Final   C Diff toxin NEGATIVE NEGATIVE Final   C Diff interpretation No C. difficile detected.  Final    Comment: Performed at [facility], [address] [place], [place] [address]  Gastrointestinal Panel by PCR , Stool     Status: None   Collection Time: 03/13/18  6:48 PM  Result Value Ref Range Status   Campylobacter species NOT DETECTED NOT DETECTED Final   Plesimonas shigelloides NOT DETECTED NOT  DETECTED Final   Salmonella species NOT DETECTED NOT DETECTED Final   Yersinia enterocolitica NOT DETECTED NOT DETECTED Final   Vibrio species NOT DETECTED NOT DETECTED Final   Vibrio cholerae NOT DETECTED NOT DETECTED Final   Enteroaggregative E coli (EAEC) NOT DETECTED NOT DETECTED Final   Enteropathogenic E coli (EPEC) NOT DETECTED NOT DETECTED Final   Enterotoxigenic E coli (ETEC) NOT DETECTED NOT DETECTED Final   Shiga like toxin producing E coli (STEC) NOT DETECTED NOT DETECTED Final   Shigella/Enteroinvasive E coli (EIEC) NOT DETECTED NOT DETECTED Final   Cryptosporidium NOT DETECTED NOT DETECTED Final   Cyclospora cayetanensis NOT DETECTED NOT DETECTED Final   Entamoeba histolytica NOT DETECTED NOT DETECTED Final   Giardia lamblia NOT DETECTED NOT DETECTED Final   Adenovirus F40/41 NOT DETECTED NOT DETECTED Final   Astrovirus NOT DETECTED NOT DETECTED Final   Norovirus GI/GII NOT DETECTED NOT DETECTED Final   Rotavirus A NOT DETECTED NOT DETECTED Final   Sapovirus (I, II, IV, and V) NOT DETECTED NOT DETECTED Final    Comment: Performed at [facility], [address], [place], [place] [address]    RADIOLOGY:  No results found.   Management plans discussed with the patient, family and they are in agreement.  CODE STATUS:     Code Status Orders  (From admission, onward)         Start     Ordered   06/30/18 2013  Do not attempt resuscitation (DNR)  Continuous    Question Answer Comment  In the event of cardiac or respiratory ARREST Do not call a "code blue"   In the event of cardiac or respiratory ARREST Do not perform Intubation, CPR, defibrillation or ACLS   In the event of cardiac or respiratory ARREST Use medication by any route, position, wound care, and other measures to relive pain and suffering. May use oxygen, suction and manual treatment of airway obstruction as needed for comfort.      06/30/18 2012        Code Status History    This patient  has a current code status but no historical code status.    Advance Directive Documentation     Most Recent Value  Type of Advance Directive  Healthcare Power of Attorney, Living will  Pre-existing out of facility DNR order (yellow form or pink MOST form)  -  "MOST" Form in Place?  -      TOTAL TIME TAKING CARE OF THIS PATIENT: 38 minutes.    Gladstone Lighter M.D on 07/02/2018 at 12:28  PM  Between 7am to 6pm - Pager - [phone]  After 6pm go to [email or web address] - Proofreader  [facility] [place] [facility]  Office  [phone]  CC: Primary care physician; Rusty Aus, MD   Note: This dictation was prepared with Dragon dictation along with smaller phrase technology. Any transcriptional errors that result from this process are unintentional.

## 2018-07-02 NOTE — Plan of Care (Signed)
  Problem: Education: Goal: Knowledge of General Education information will improve Description Including pain rating scale, medication(s)/side effects and non-pharmacologic comfort measures Outcome: Progressing   Problem: Clinical Measurements: Goal: Ability to maintain clinical measurements within normal limits will improve Outcome: Progressing   Problem: Elimination: Goal: Will not experience complications related to bowel motility Outcome: Progressing   Problem: Pain Managment: Goal: General experience of comfort will improve Outcome: Progressing   Problem: Safety: Goal: Ability to remain free from injury will improve Outcome: Progressing   Problem: Skin Integrity: Goal: Risk for impaired skin integrity will decrease Outcome: Progressing

## 2018-07-02 NOTE — Progress Notes (Signed)
Patient discharged home with niece. Verbalized understanding of education. Patient with no complaints.

## 2018-07-08 DIAGNOSIS — E871 Hypo-osmolality and hyponatremia: Secondary | ICD-10-CM | POA: Diagnosis not present

## 2018-07-08 DIAGNOSIS — J181 Lobar pneumonia, unspecified organism: Secondary | ICD-10-CM | POA: Diagnosis not present

## 2018-07-13 DIAGNOSIS — J40 Bronchitis, not specified as acute or chronic: Secondary | ICD-10-CM | POA: Diagnosis not present

## 2018-07-13 DIAGNOSIS — R600 Localized edema: Secondary | ICD-10-CM | POA: Diagnosis not present

## 2018-07-13 DIAGNOSIS — E871 Hypo-osmolality and hyponatremia: Secondary | ICD-10-CM | POA: Diagnosis not present

## 2018-07-13 DIAGNOSIS — J45902 Unspecified asthma with status asthmaticus: Secondary | ICD-10-CM | POA: Diagnosis not present

## 2018-07-27 DIAGNOSIS — E871 Hypo-osmolality and hyponatremia: Secondary | ICD-10-CM | POA: Diagnosis not present

## 2018-07-27 DIAGNOSIS — I10 Essential (primary) hypertension: Secondary | ICD-10-CM | POA: Diagnosis not present

## 2018-07-27 DIAGNOSIS — J40 Bronchitis, not specified as acute or chronic: Secondary | ICD-10-CM | POA: Diagnosis not present

## 2018-07-27 DIAGNOSIS — J90 Pleural effusion, not elsewhere classified: Secondary | ICD-10-CM | POA: Diagnosis not present

## 2018-07-27 DIAGNOSIS — R600 Localized edema: Secondary | ICD-10-CM | POA: Diagnosis not present

## 2018-08-05 ENCOUNTER — Other Ambulatory Visit: Payer: Self-pay | Admitting: Physician Assistant

## 2018-08-05 DIAGNOSIS — J019 Acute sinusitis, unspecified: Secondary | ICD-10-CM

## 2018-08-05 DIAGNOSIS — J309 Allergic rhinitis, unspecified: Secondary | ICD-10-CM | POA: Diagnosis not present

## 2018-08-05 DIAGNOSIS — B379 Candidiasis, unspecified: Secondary | ICD-10-CM | POA: Diagnosis not present

## 2018-08-06 ENCOUNTER — Ambulatory Visit
Admission: RE | Admit: 2018-08-06 | Discharge: 2018-08-06 | Disposition: A | Discharge status: Home / Self Care | Payer: Medicare HMO | Source: Ambulatory Visit | Attending: Physician Assistant | Admitting: Physician Assistant

## 2018-08-06 DIAGNOSIS — H538 Other visual disturbances: Secondary | ICD-10-CM | POA: Diagnosis not present

## 2018-08-06 DIAGNOSIS — J3489 Other specified disorders of nose and nasal sinuses: Secondary | ICD-10-CM | POA: Insufficient documentation

## 2018-08-06 DIAGNOSIS — J019 Acute sinusitis, unspecified: Secondary | ICD-10-CM | POA: Diagnosis not present

## 2018-08-31 DIAGNOSIS — J019 Acute sinusitis, unspecified: Secondary | ICD-10-CM | POA: Diagnosis not present

## 2018-08-31 DIAGNOSIS — B379 Candidiasis, unspecified: Secondary | ICD-10-CM | POA: Diagnosis not present

## 2018-08-31 DIAGNOSIS — M26609 Unspecified temporomandibular joint disorder, unspecified side: Secondary | ICD-10-CM | POA: Diagnosis not present

## 2018-08-31 DIAGNOSIS — R51 Headache: Secondary | ICD-10-CM | POA: Diagnosis not present

## 2018-09-06 DIAGNOSIS — Z79899 Other long term (current) drug therapy: Secondary | ICD-10-CM | POA: Diagnosis not present

## 2018-09-23 DIAGNOSIS — Z79899 Other long term (current) drug therapy: Secondary | ICD-10-CM | POA: Diagnosis not present

## 2018-09-23 DIAGNOSIS — Z0000 Encounter for general adult medical examination without abnormal findings: Secondary | ICD-10-CM | POA: Diagnosis not present

## 2018-09-23 DIAGNOSIS — Z23 Encounter for immunization: Secondary | ICD-10-CM | POA: Diagnosis not present

## 2018-09-23 DIAGNOSIS — J4540 Moderate persistent asthma, uncomplicated: Secondary | ICD-10-CM | POA: Diagnosis not present

## 2018-09-23 DIAGNOSIS — F331 Major depressive disorder, recurrent, moderate: Secondary | ICD-10-CM | POA: Diagnosis not present

## 2018-11-04 DIAGNOSIS — H26493 Other secondary cataract, bilateral: Secondary | ICD-10-CM | POA: Diagnosis not present

## 2018-11-26 ENCOUNTER — Ambulatory Visit (INDEPENDENT_AMBULATORY_CARE_PROVIDER_SITE_OTHER): Payer: Medicare HMO | Admitting: Nurse Practitioner

## 2019-01-02 IMAGING — CT CT MAXILLOFACIAL W/O CM
3 of 4 series · 14 of 47 positions shown, 16 images · non-contrast
Comparison: 03/10/2013

CLINICAL DATA: PT states she has been having pain across her face
all the way down to her jaw bone. Pt states the pain has been on
going for about 1 month. Pt states she has been having drainage of
mucus as well. Pt states its also been effecting both of her eyes
has hard time seeing blurred vision.

EXAM:
CT MAXILLOFACIAL WITHOUT CONTRAST
TECHNIQUE: Multidetector CT imaging of the maxillofacial structures was
performed. Multiplanar CT image reconstructions were also generated.

[Series 2: sinus · axial · 0.26mm/px · z∈[-574,-446]mm · 8 of 76 slices shown, 10 images (1 of 3)]
[im 6/76  brain]
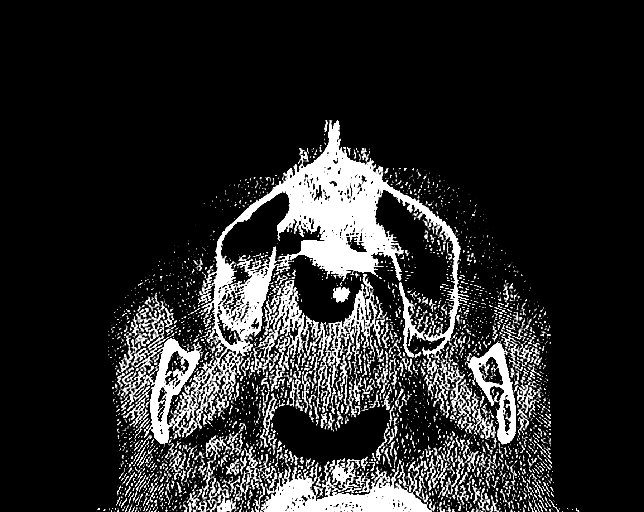
[im 6/76  bone]
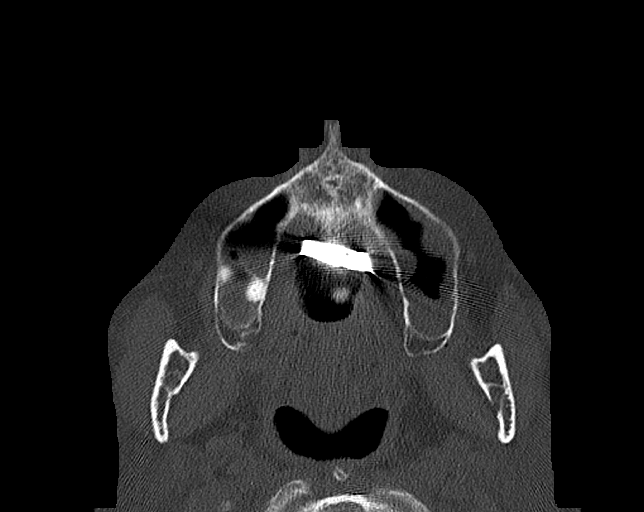
[im 17/76  bone]
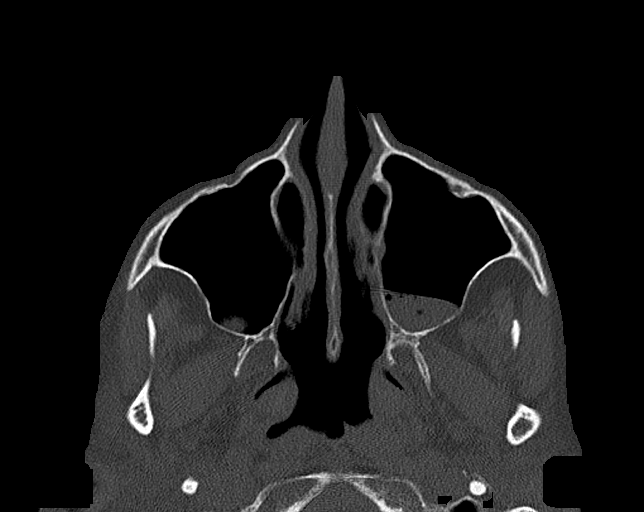
[im 27/76  bone]
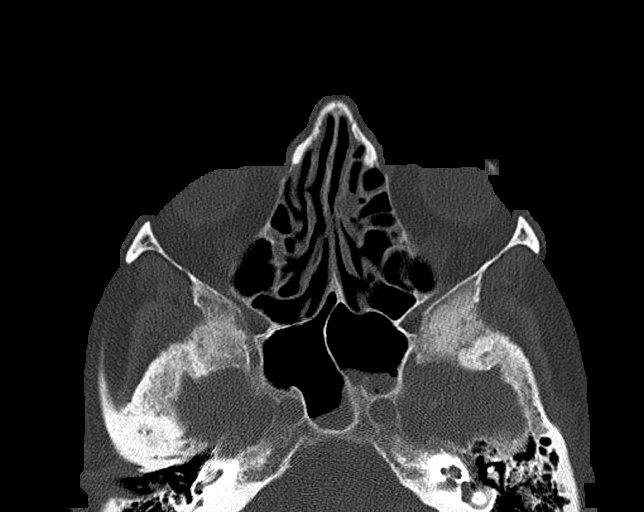
[im 33/76  bone]
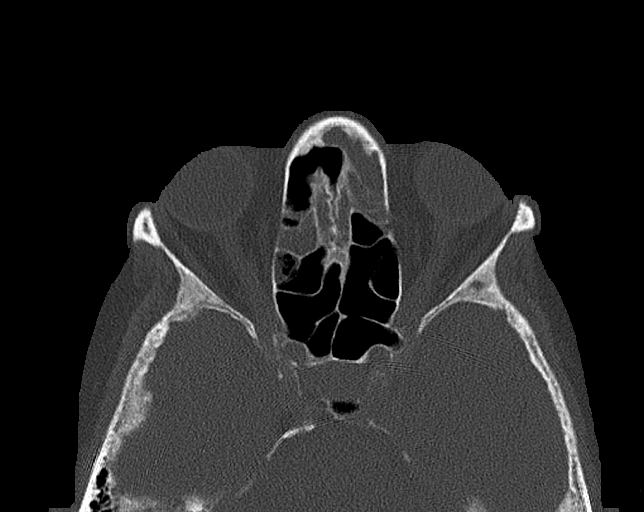
[im 43/76  brain]
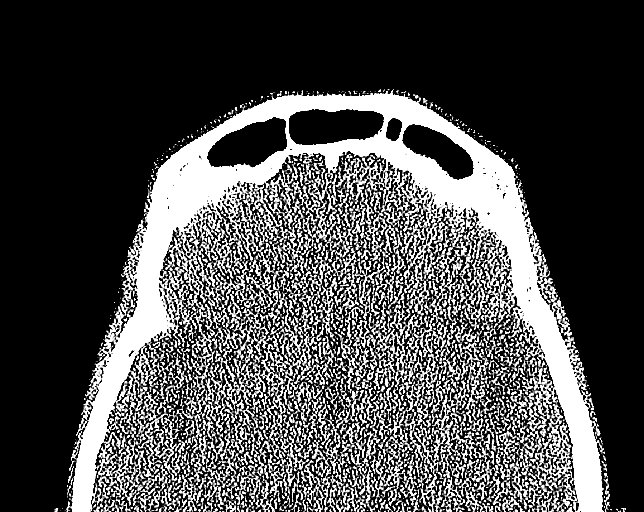
[im 43/76  bone]
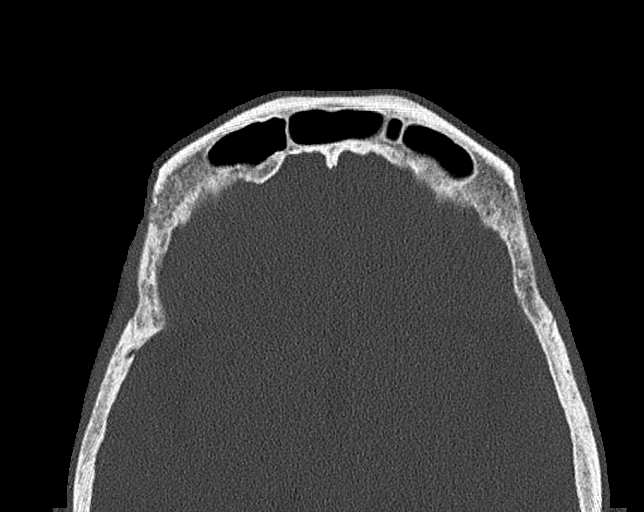
[im 49/76  bone]
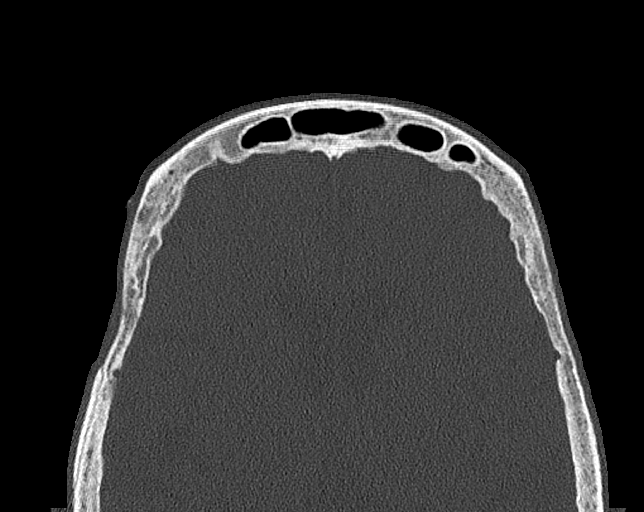
[im 59/76  bone]
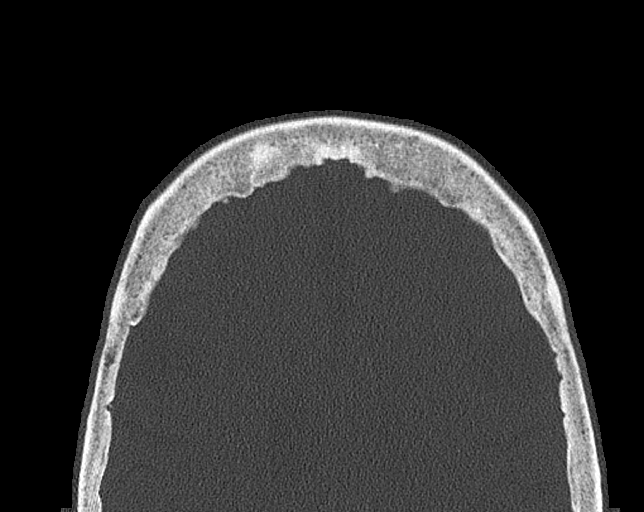
[im 70/76  bone]
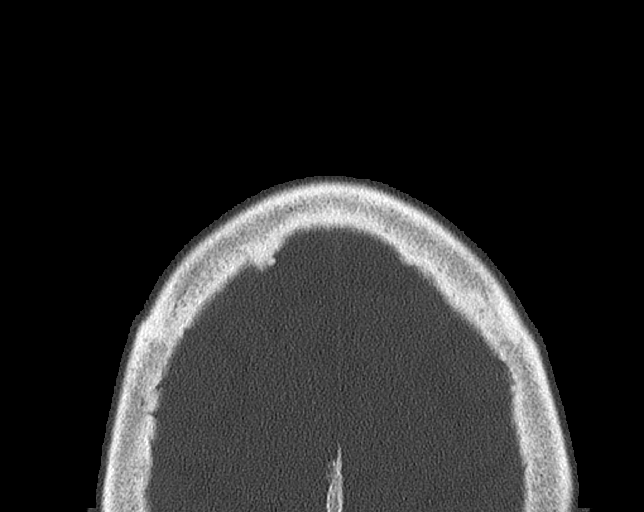

[Series 6: sinus · coronal · 0.30mm/px · 3 of 67 slices shown (2 of 3)]
[im 23/67  bone]
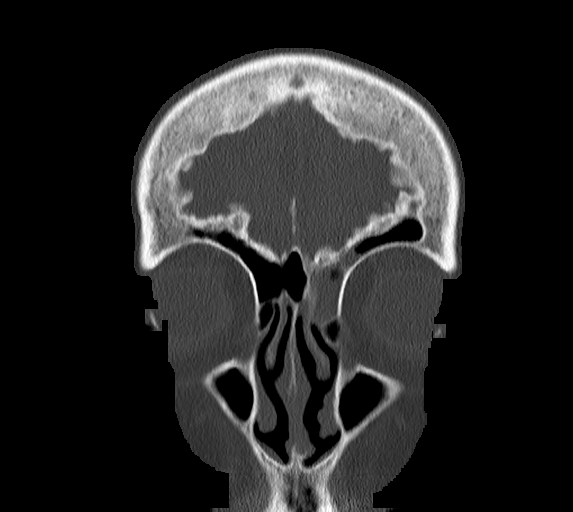
[im 30/67  bone]
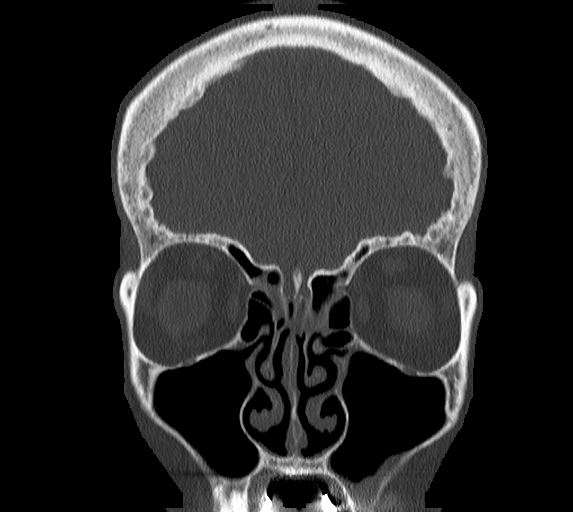
[im 37/67  bone]
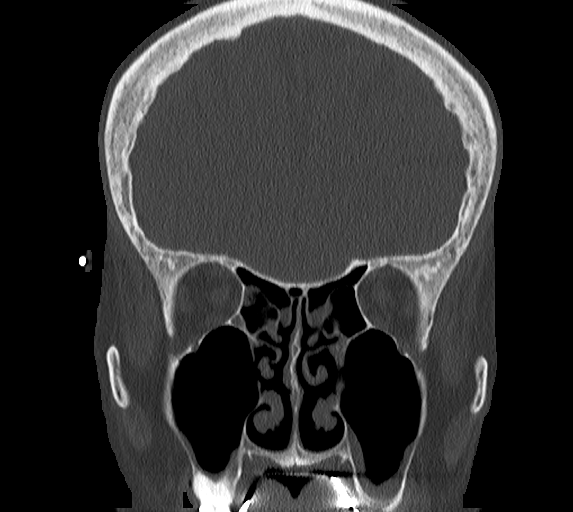

[Series 8: sinus · sagittal · 0.26mm/px · 3 of 85 slices shown (3 of 3)]
[im 29/85  bone]
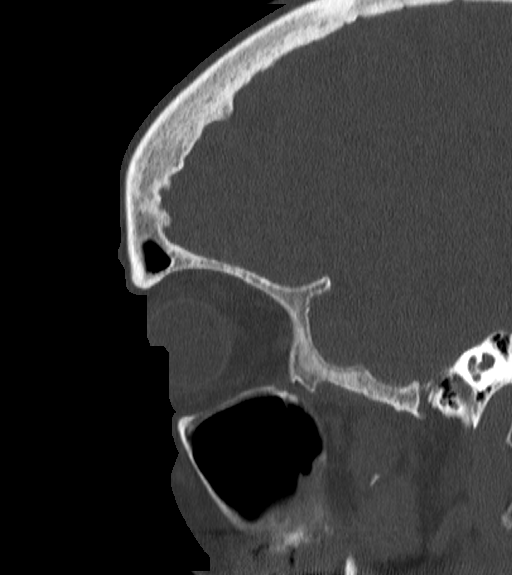
[im 43/85  bone]
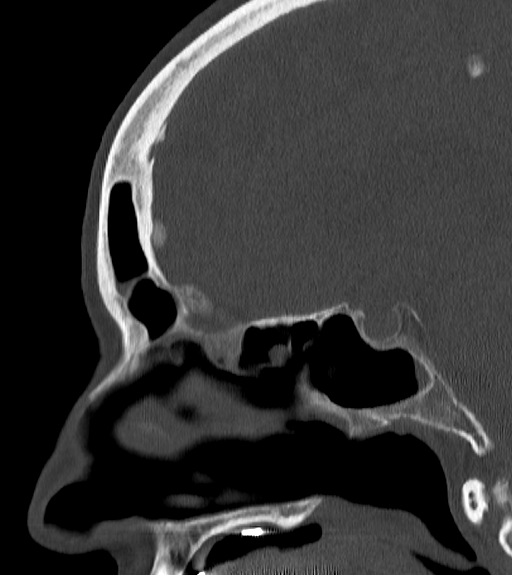
[im 57/85  bone]
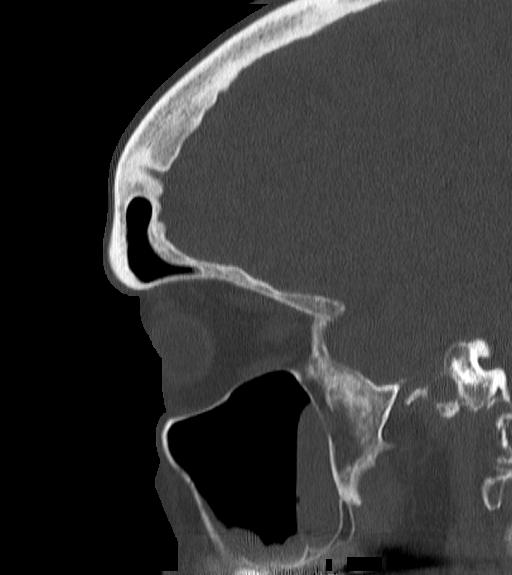

[14 of 47 positions shown; findings below may reference images not displayed]

FINDINGS: Osseous: No fracture or bone lesion. Flattening of the mandibular
heads reflecting TMJ joint arthritis.

Orbits: Globes and orbits are unremarkable other than changes from
prior cataract surgery.

Sinuses: Mild left inferior frontal sinus mucosal thickening with
mucosal thickening occluding the frontoethmoidal recess. Mild
bilateral ethmoid sinus mucosal thickening, mostly along the
anterior and middle ethmoid air cells, left slightly greater than
right. Dependent fluid/secretions in both maxillary sinuses. Mild
left and minor right inferior maxillary sinus mucosal thickening.
Dependent fluid/secretions in the sphenoid sinuses.

Mucosal thickening obstructs the ostia of the left ostiomeatal
complex.

Soft tissues: No soft tissue mass, adenopathy or inflammation.

Limited intracranial: No significant or unexpected finding.
IMPRESSION: 1. Sinus disease with mucosal thickening with dependent
fluid/secretions in the maxillary and sphenoid sinuses. Mucosal
thickening occludes the ostium of the left ostiomeatal complex.

## 2019-01-04 ENCOUNTER — Ambulatory Visit (INDEPENDENT_AMBULATORY_CARE_PROVIDER_SITE_OTHER): Payer: Medicare HMO | Admitting: Vascular Surgery

## 2019-03-18 DIAGNOSIS — Z79899 Other long term (current) drug therapy: Secondary | ICD-10-CM | POA: Diagnosis not present

## 2019-03-24 DIAGNOSIS — I10 Essential (primary) hypertension: Secondary | ICD-10-CM | POA: Diagnosis not present

## 2019-03-24 DIAGNOSIS — I739 Peripheral vascular disease, unspecified: Secondary | ICD-10-CM | POA: Diagnosis not present

## 2019-03-24 DIAGNOSIS — Z79899 Other long term (current) drug therapy: Secondary | ICD-10-CM | POA: Diagnosis not present

## 2019-03-24 DIAGNOSIS — F331 Major depressive disorder, recurrent, moderate: Secondary | ICD-10-CM | POA: Diagnosis not present

## 2019-03-24 DIAGNOSIS — Z0000 Encounter for general adult medical examination without abnormal findings: Secondary | ICD-10-CM | POA: Diagnosis not present

## 2019-07-15 DIAGNOSIS — J40 Bronchitis, not specified as acute or chronic: Secondary | ICD-10-CM | POA: Diagnosis not present

## 2019-07-15 DIAGNOSIS — J4540 Moderate persistent asthma, uncomplicated: Secondary | ICD-10-CM | POA: Diagnosis not present

## 2019-09-14 DIAGNOSIS — Z85828 Personal history of other malignant neoplasm of skin: Secondary | ICD-10-CM | POA: Diagnosis not present

## 2019-09-14 DIAGNOSIS — L578 Other skin changes due to chronic exposure to nonionizing radiation: Secondary | ICD-10-CM | POA: Diagnosis not present

## 2019-09-14 DIAGNOSIS — C44519 Basal cell carcinoma of skin of other part of trunk: Secondary | ICD-10-CM | POA: Diagnosis not present

## 2019-09-20 DIAGNOSIS — Z79899 Other long term (current) drug therapy: Secondary | ICD-10-CM | POA: Diagnosis not present

## 2019-09-27 DIAGNOSIS — J45909 Unspecified asthma, uncomplicated: Secondary | ICD-10-CM | POA: Diagnosis not present

## 2019-09-27 DIAGNOSIS — I10 Essential (primary) hypertension: Secondary | ICD-10-CM | POA: Diagnosis not present

## 2019-09-27 DIAGNOSIS — E876 Hypokalemia: Secondary | ICD-10-CM | POA: Diagnosis not present

## 2019-09-27 DIAGNOSIS — Z0000 Encounter for general adult medical examination without abnormal findings: Secondary | ICD-10-CM | POA: Diagnosis not present

## 2019-09-27 DIAGNOSIS — R5383 Other fatigue: Secondary | ICD-10-CM | POA: Diagnosis not present

## 2019-11-07 DIAGNOSIS — J189 Pneumonia, unspecified organism: Secondary | ICD-10-CM | POA: Diagnosis not present

## 2019-11-07 DIAGNOSIS — I739 Peripheral vascular disease, unspecified: Secondary | ICD-10-CM | POA: Diagnosis not present

## 2019-11-07 DIAGNOSIS — F329 Major depressive disorder, single episode, unspecified: Secondary | ICD-10-CM | POA: Diagnosis not present

## 2019-11-07 DIAGNOSIS — R05 Cough: Secondary | ICD-10-CM | POA: Diagnosis not present

## 2019-11-07 DIAGNOSIS — J018 Other acute sinusitis: Secondary | ICD-10-CM | POA: Diagnosis not present

## 2019-11-16 DIAGNOSIS — H6121 Impacted cerumen, right ear: Secondary | ICD-10-CM | POA: Diagnosis not present

## 2019-11-16 DIAGNOSIS — L578 Other skin changes due to chronic exposure to nonionizing radiation: Secondary | ICD-10-CM | POA: Diagnosis not present

## 2019-11-16 DIAGNOSIS — Z85828 Personal history of other malignant neoplasm of skin: Secondary | ICD-10-CM | POA: Diagnosis not present

## 2019-11-16 DIAGNOSIS — J441 Chronic obstructive pulmonary disease with (acute) exacerbation: Secondary | ICD-10-CM | POA: Diagnosis not present

## 2019-11-30 DIAGNOSIS — I471 Supraventricular tachycardia: Secondary | ICD-10-CM | POA: Diagnosis not present

## 2019-11-30 DIAGNOSIS — J449 Chronic obstructive pulmonary disease, unspecified: Secondary | ICD-10-CM | POA: Diagnosis not present

## 2020-01-13 DIAGNOSIS — H02132 Senile ectropion of right lower eyelid: Secondary | ICD-10-CM | POA: Diagnosis not present

## 2020-02-14 DIAGNOSIS — J40 Bronchitis, not specified as acute or chronic: Secondary | ICD-10-CM | POA: Diagnosis not present

## 2020-03-20 DIAGNOSIS — Z79899 Other long term (current) drug therapy: Secondary | ICD-10-CM | POA: Diagnosis not present

## 2020-03-23 DIAGNOSIS — J449 Chronic obstructive pulmonary disease, unspecified: Secondary | ICD-10-CM | POA: Diagnosis not present

## 2020-03-23 DIAGNOSIS — F329 Major depressive disorder, single episode, unspecified: Secondary | ICD-10-CM | POA: Diagnosis not present

## 2020-03-23 DIAGNOSIS — Z0000 Encounter for general adult medical examination without abnormal findings: Secondary | ICD-10-CM | POA: Diagnosis not present

## 2020-03-23 DIAGNOSIS — I110 Hypertensive heart disease with heart failure: Secondary | ICD-10-CM | POA: Diagnosis not present

## 2020-03-23 DIAGNOSIS — F320 Major depressive disorder, single episode, mild: Secondary | ICD-10-CM | POA: Diagnosis not present

## 2020-03-23 DIAGNOSIS — I509 Heart failure, unspecified: Secondary | ICD-10-CM | POA: Diagnosis not present

## 2020-03-23 DIAGNOSIS — Z79899 Other long term (current) drug therapy: Secondary | ICD-10-CM | POA: Diagnosis not present

## 2020-05-17 ENCOUNTER — Encounter: Payer: PRIVATE HEALTH INSURANCE | Admitting: Dermatology

## 2020-08-15 DIAGNOSIS — J45909 Unspecified asthma, uncomplicated: Secondary | ICD-10-CM | POA: Diagnosis not present

## 2020-08-15 DIAGNOSIS — Z20822 Contact with and (suspected) exposure to covid-19: Secondary | ICD-10-CM | POA: Diagnosis not present

## 2020-08-21 DIAGNOSIS — R0981 Nasal congestion: Secondary | ICD-10-CM | POA: Diagnosis not present

## 2020-08-21 DIAGNOSIS — J209 Acute bronchitis, unspecified: Secondary | ICD-10-CM | POA: Diagnosis not present

## 2020-08-21 DIAGNOSIS — R197 Diarrhea, unspecified: Secondary | ICD-10-CM | POA: Diagnosis not present

## 2020-08-21 DIAGNOSIS — Z20822 Contact with and (suspected) exposure to covid-19: Secondary | ICD-10-CM | POA: Diagnosis not present

## 2020-08-21 DIAGNOSIS — J449 Chronic obstructive pulmonary disease, unspecified: Secondary | ICD-10-CM | POA: Diagnosis not present

## 2020-08-27 ENCOUNTER — Inpatient Hospital Stay
Admission: EM | Admit: 2020-08-27 | Discharge: 2020-09-03 | DRG: 871 | Disposition: A | Discharge status: Home Health | Payer: Medicare HMO | Attending: Internal Medicine | Admitting: Internal Medicine

## 2020-08-27 ENCOUNTER — Encounter: Payer: Self-pay | Admitting: Emergency Medicine

## 2020-08-27 ENCOUNTER — Emergency Department: Payer: Medicare HMO

## 2020-08-27 ENCOUNTER — Other Ambulatory Visit: Payer: Self-pay

## 2020-08-27 DIAGNOSIS — I10 Essential (primary) hypertension: Secondary | ICD-10-CM | POA: Diagnosis not present

## 2020-08-27 DIAGNOSIS — Z880 Allergy status to penicillin: Secondary | ICD-10-CM | POA: Diagnosis not present

## 2020-08-27 DIAGNOSIS — R918 Other nonspecific abnormal finding of lung field: Secondary | ICD-10-CM | POA: Diagnosis not present

## 2020-08-27 DIAGNOSIS — J9601 Acute respiratory failure with hypoxia: Secondary | ICD-10-CM | POA: Diagnosis not present

## 2020-08-27 DIAGNOSIS — J189 Pneumonia, unspecified organism: Secondary | ICD-10-CM | POA: Diagnosis not present

## 2020-08-27 DIAGNOSIS — Z79899 Other long term (current) drug therapy: Secondary | ICD-10-CM

## 2020-08-27 DIAGNOSIS — I4891 Unspecified atrial fibrillation: Secondary | ICD-10-CM | POA: Diagnosis present

## 2020-08-27 DIAGNOSIS — Z86010 Personal history of colonic polyps: Secondary | ICD-10-CM

## 2020-08-27 DIAGNOSIS — Z66 Do not resuscitate: Secondary | ICD-10-CM | POA: Diagnosis present

## 2020-08-27 DIAGNOSIS — M1990 Unspecified osteoarthritis, unspecified site: Secondary | ICD-10-CM | POA: Diagnosis present

## 2020-08-27 DIAGNOSIS — N179 Acute kidney failure, unspecified: Secondary | ICD-10-CM | POA: Diagnosis not present

## 2020-08-27 DIAGNOSIS — J988 Other specified respiratory disorders: Secondary | ICD-10-CM | POA: Diagnosis not present

## 2020-08-27 DIAGNOSIS — J9690 Respiratory failure, unspecified, unspecified whether with hypoxia or hypercapnia: Secondary | ICD-10-CM | POA: Diagnosis not present

## 2020-08-27 DIAGNOSIS — Z20822 Contact with and (suspected) exposure to covid-19: Secondary | ICD-10-CM | POA: Diagnosis not present

## 2020-08-27 DIAGNOSIS — K219 Gastro-esophageal reflux disease without esophagitis: Secondary | ICD-10-CM | POA: Diagnosis present

## 2020-08-27 DIAGNOSIS — E871 Hypo-osmolality and hyponatremia: Secondary | ICD-10-CM | POA: Diagnosis present

## 2020-08-27 DIAGNOSIS — R54 Age-related physical debility: Secondary | ICD-10-CM | POA: Diagnosis present

## 2020-08-27 DIAGNOSIS — R0902 Hypoxemia: Secondary | ICD-10-CM | POA: Diagnosis not present

## 2020-08-27 DIAGNOSIS — J479 Bronchiectasis, uncomplicated: Secondary | ICD-10-CM | POA: Diagnosis not present

## 2020-08-27 DIAGNOSIS — Z882 Allergy status to sulfonamides status: Secondary | ICD-10-CM

## 2020-08-27 DIAGNOSIS — Z8249 Family history of ischemic heart disease and other diseases of the circulatory system: Secondary | ICD-10-CM | POA: Diagnosis not present

## 2020-08-27 DIAGNOSIS — Z881 Allergy status to other antibiotic agents status: Secondary | ICD-10-CM | POA: Diagnosis not present

## 2020-08-27 DIAGNOSIS — A419 Sepsis, unspecified organism: Secondary | ICD-10-CM | POA: Diagnosis not present

## 2020-08-27 DIAGNOSIS — R0602 Shortness of breath: Secondary | ICD-10-CM | POA: Diagnosis not present

## 2020-08-27 DIAGNOSIS — Z888 Allergy status to other drugs, medicaments and biological substances status: Secondary | ICD-10-CM

## 2020-08-27 DIAGNOSIS — I480 Paroxysmal atrial fibrillation: Secondary | ICD-10-CM | POA: Diagnosis not present

## 2020-08-27 LAB — MAGNESIUM: Magnesium: 1.7 mg/dL (ref 1.7–2.4)

## 2020-08-27 LAB — BASIC METABOLIC PANEL
Anion gap: 13 (ref 5–15)
BUN: 28 mg/dL — ABNORMAL HIGH (ref 8–23)
CO2: 24 mmol/L (ref 22–32)
Calcium: 9.9 mg/dL (ref 8.9–10.3)
Chloride: 90 mmol/L — ABNORMAL LOW (ref 98–111)
Creatinine, Ser: 1.19 mg/dL — ABNORMAL HIGH (ref 0.44–1.00)
GFR, Estimated: 43 mL/min — ABNORMAL LOW (ref >60)
Glucose, Bld: 131 mg/dL — ABNORMAL HIGH (ref 70–99)
Potassium: 4.3 mmol/L (ref 3.5–5.1)
Sodium: 127 mmol/L — ABNORMAL LOW (ref 135–145)

## 2020-08-27 LAB — CBC
HCT: 46.9 % — ABNORMAL HIGH (ref 36.0–46.0)
Hemoglobin: 15.8 g/dL — ABNORMAL HIGH (ref 12.0–15.0)
MCH: 29.6 pg (ref 26.0–34.0)
MCHC: 33.7 g/dL (ref 30.0–36.0)
MCV: 88 fL (ref 80.0–100.0)
Platelets: 442 10*3/uL — ABNORMAL HIGH (ref 150–400)
RBC: 5.33 MIL/uL — ABNORMAL HIGH (ref 3.87–5.11)
RDW: 14 % (ref 11.5–15.5)
WBC: 25.7 10*3/uL — ABNORMAL HIGH (ref 4.0–10.5)
nRBC: 0 % (ref 0.0–0.2)

## 2020-08-27 LAB — CBC WITH DIFFERENTIAL/PLATELET
Abs Immature Granulocytes: 0 10*3/uL (ref 0.00–0.07)
Basophils Absolute: 0 10*3/uL (ref 0.0–0.1)
Basophils Relative: 0 %
Eosinophils Absolute: 0 10*3/uL (ref 0.0–0.5)
Eosinophils Relative: 0 %
HCT: 46 % (ref 36.0–46.0)
Hemoglobin: 15.5 g/dL — ABNORMAL HIGH (ref 12.0–15.0)
Lymphocytes Relative: 3 %
Lymphs Abs: 0.8 10*3/uL (ref 0.7–4.0)
MCH: 30 pg (ref 26.0–34.0)
MCHC: 33.7 g/dL (ref 30.0–36.0)
MCV: 89.1 fL (ref 80.0–100.0)
Monocytes Absolute: 2 10*3/uL — ABNORMAL HIGH (ref 0.1–1.0)
Monocytes Relative: 8 %
Neutro Abs: 22.4 10*3/uL — ABNORMAL HIGH (ref 1.7–7.7)
Neutrophils Relative %: 89 %
Platelets: 443 10*3/uL — ABNORMAL HIGH (ref 150–400)
RBC: 5.16 MIL/uL — ABNORMAL HIGH (ref 3.87–5.11)
RDW: 14.1 % (ref 11.5–15.5)
WBC: 25.2 10*3/uL — ABNORMAL HIGH (ref 4.0–10.5)
nRBC: 0 % (ref 0.0–0.2)

## 2020-08-27 LAB — HEPATIC FUNCTION PANEL
ALT: 16 U/L (ref 0–44)
AST: 21 U/L (ref 15–41)
Albumin: 2.7 g/dL — ABNORMAL LOW (ref 3.5–5.0)
Alkaline Phosphatase: 50 U/L (ref 38–126)
Bilirubin, Direct: <0.1 mg/dL (ref 0.0–0.2)
Total Bilirubin: 0.6 mg/dL (ref 0.3–1.2)
Total Protein: 5.7 g/dL — ABNORMAL LOW (ref 6.5–8.1)

## 2020-08-27 LAB — OSMOLALITY: Osmolality: 273 mOsm/kg — ABNORMAL LOW (ref 275–295)

## 2020-08-27 LAB — RESP PANEL BY RT-PCR (FLU A&B, COVID) ARPGX2
Influenza A by PCR: NEGATIVE
Influenza B by PCR: NEGATIVE
SARS Coronavirus 2 by RT PCR: NEGATIVE

## 2020-08-27 LAB — TSH
TSH: 1.292 u[IU]/mL (ref 0.350–4.500)
TSH: 0.818 u[IU]/mL (ref 0.350–4.500)

## 2020-08-27 LAB — PROCALCITONIN: Procalcitonin: <0.1 ng/mL

## 2020-08-27 LAB — LACTIC ACID, PLASMA
Lactic Acid, Venous: 1.5 mmol/L (ref 0.5–1.9)
Lactic Acid, Venous: 1.8 mmol/L (ref 0.5–1.9)

## 2020-08-27 LAB — TROPONIN I (HIGH SENSITIVITY)
Troponin I (High Sensitivity): 47 ng/L — ABNORMAL HIGH (ref <18)
Troponin I (High Sensitivity): 40 ng/L — ABNORMAL HIGH (ref <18)

## 2020-08-27 LAB — APTT: aPTT: 26 seconds (ref 24–36)

## 2020-08-27 LAB — BRAIN NATRIURETIC PEPTIDE: B Natriuretic Peptide: 152.2 pg/mL — ABNORMAL HIGH (ref 0.0–100.0)

## 2020-08-27 LAB — PROTIME-INR
INR: 1.2 (ref 0.8–1.2)
Prothrombin Time: 14.6 seconds (ref 11.4–15.2)

## 2020-08-27 MED ORDER — SODIUM CHLORIDE 0.9 % IV SOLN
500.0000 mg | Freq: Once | INTRAVENOUS | Status: AC
  Administered 2020-08-27: 500 mg via INTRAVENOUS
  Filled 2020-08-27: qty 500

## 2020-08-27 MED ORDER — DILTIAZEM HCL-DEXTROSE 125-5 MG/125ML-% IV SOLN (PREMIX)
5.0000 mg/h | INTRAVENOUS | Status: DC
  Administered 2020-08-27: 5 mg/h via INTRAVENOUS
  Administered 2020-08-28: 10 mg/h via INTRAVENOUS
  Filled 2020-08-27 (×2): qty 125

## 2020-08-27 MED ORDER — DILTIAZEM LOAD VIA INFUSION
10.0000 mg | Freq: Once | INTRAVENOUS | Status: AC
  Administered 2020-08-27: 10 mg via INTRAVENOUS
  Filled 2020-08-27: qty 10

## 2020-08-27 MED ORDER — ENOXAPARIN SODIUM 40 MG/0.4ML ~~LOC~~ SOLN: 40.0000 mg | SUBCUTANEOUS | Status: DC

## 2020-08-27 MED ORDER — SODIUM CHLORIDE 0.9 % IV BOLUS
1000.0000 mL | Freq: Once | INTRAVENOUS | Status: AC
  Administered 2020-08-27: 1000 mL via INTRAVENOUS

## 2020-08-27 MED ORDER — DOXYCYCLINE HYCLATE 100 MG PO TABS
100.0000 mg | ORAL_TABLET | Freq: Two times a day (BID) | ORAL | Status: DC
  Administered 2020-08-27: 100 mg via ORAL
  Filled 2020-08-27: qty 1

## 2020-08-27 MED ORDER — VANCOMYCIN HCL 1500 MG/300ML IV SOLN
1500.0000 mg | Freq: Once | INTRAVENOUS | Status: AC
  Administered 2020-08-27: 1500 mg via INTRAVENOUS
  Filled 2020-08-27: qty 300

## 2020-08-27 MED ORDER — ACETAMINOPHEN 650 MG RE SUPP: 650.0000 mg | Freq: Four times a day (QID) | RECTAL | Status: DC | PRN

## 2020-08-27 MED ORDER — VANCOMYCIN HCL 750 MG/150ML IV SOLN
750.0000 mg | INTRAVENOUS | Status: DC
  Filled 2020-08-27: qty 150

## 2020-08-27 MED ORDER — POTASSIUM CHLORIDE IN NACL 20-0.9 MEQ/L-% IV SOLN
INTRAVENOUS | Status: DC
  Filled 2020-08-27 (×3): qty 1000

## 2020-08-27 MED ORDER — ONDANSETRON HCL 4 MG PO TABS
4.0000 mg | ORAL_TABLET | Freq: Four times a day (QID) | ORAL | Status: DC | PRN
  Administered 2020-08-31 – 2020-09-02 (×2): 4 mg via ORAL
  Filled 2020-08-27 (×2): qty 1

## 2020-08-27 MED ORDER — ASPIRIN EC 81 MG PO TBEC
81.0000 mg | DELAYED_RELEASE_TABLET | Freq: Every day | ORAL | Status: DC
  Administered 2020-08-27 – 2020-08-28 (×2): 81 mg via ORAL
  Filled 2020-08-27 (×2): qty 1

## 2020-08-27 MED ORDER — ENOXAPARIN SODIUM 30 MG/0.3ML ~~LOC~~ SOLN
30.0000 mg | SUBCUTANEOUS | Status: DC
  Administered 2020-08-27: 30 mg via SUBCUTANEOUS
  Filled 2020-08-27: qty 0.3

## 2020-08-27 MED ORDER — GUAIFENESIN 100 MG/5ML PO SOLN
5.0000 mL | ORAL | Status: DC | PRN
  Administered 2020-08-28 – 2020-09-01 (×8): 100 mg via ORAL
  Filled 2020-08-27 (×11): qty 5

## 2020-08-27 MED ORDER — ONDANSETRON HCL 4 MG/2ML IJ SOLN: 4.0000 mg | Freq: Four times a day (QID) | INTRAMUSCULAR | Status: DC | PRN

## 2020-08-27 MED ORDER — SODIUM CHLORIDE 0.9 % IV SOLN
1.0000 g | Freq: Once | INTRAVENOUS | Status: AC
  Administered 2020-08-27: 1 g via INTRAVENOUS
  Filled 2020-08-27: qty 10

## 2020-08-27 MED ORDER — ALBUTEROL SULFATE (2.5 MG/3ML) 0.083% IN NEBU: 2.5000 mg | INHALATION_SOLUTION | RESPIRATORY_TRACT | Status: DC | PRN

## 2020-08-27 MED ORDER — ACETAMINOPHEN 325 MG PO TABS
650.0000 mg | ORAL_TABLET | Freq: Four times a day (QID) | ORAL | Status: DC | PRN
  Administered 2020-08-30 – 2020-09-03 (×7): 650 mg via ORAL
  Filled 2020-08-27 (×6): qty 2

## 2020-08-27 MED ORDER — SODIUM CHLORIDE 0.9 % IV SOLN
2.0000 g | INTRAVENOUS | Status: DC
  Administered 2020-08-27: 2 g via INTRAVENOUS
  Filled 2020-08-27 (×2): qty 2

## 2020-08-27 MED ORDER — IOHEXOL 350 MG/ML SOLN
75.0000 mL | Freq: Once | INTRAVENOUS | Status: AC | PRN
  Administered 2020-08-27: 60 mL via INTRAVENOUS

## 2020-08-27 NOTE — Assessment & Plan Note (Signed)
Sepsis is due to pneumonia.  She has acute kidney injury.  She has an elevated white count of 25,000.  She has evidence of acute organ dysfunction

## 2020-08-27 NOTE — Assessment & Plan Note (Signed)
Continue supplemental oxygen. 

## 2020-08-27 NOTE — ED Notes (Signed)
A-Fib with RVR seen on EKG. Patient scheduled to go to room 1 for further evaluation.

## 2020-08-27 NOTE — ED Provider Notes (Addendum)
[facility] Emergency Department Provider Note   ____________________________________________   First MD Initiated Contact with Patient 08/27/20 1528     (approximate)  I have reviewed the triage vital signs and the nursing notes.   HISTORY  Chief Complaint Shortness of Breath    HPI [name] is a 86 y.o. female who is sent by Dr. Sabra Heck from [facility].  For hypoxia and tachycardia.  O2 sats were 89% here on room air and down to 85 in the office.  Patient has not been eating much and is short of breath.  She lives alone.  Here patient is found to be in A. fib with RVR at a rate up to the 140s.  She had an elevated D-dimer and CT of the chest showed a multifocal pneumonia in the right lower lobe.  Patient symptoms are worsened by exertion slightly better if she rests.         Past Medical History:  Diagnosis Date  . Allergic rhinitis   . Arthritis   . Colon polyp   . Edema   . Esophageal spasm   . GERD (gastroesophageal reflux disease)   . Hypertension     Patient Active Problem List   Diagnosis Date Noted  . SOB (shortness of breath) 06/30/2018  . GERD (gastroesophageal reflux disease) 04/29/2018  . Hypertension 03/02/2018  . Arthritis 03/02/2018  . Pain in limb 03/02/2018  . HTN (hypertension), benign 01/08/2014    Past Surgical History:  Procedure Laterality Date  . APPENDECTOMY    . CATARACT EXTRACTION    . dilatation and curatage    . EYE SURGERY    . HYSTEROTOMY    . OOPHORECTOMY    . TONSILLECTOMY      Prior to Admission medications   Medication Sig Start Date End Date Taking? Authorizing Provider  atenolol (TENORMIN) 25 MG tablet Take 1 tablet (25 mg total) by mouth daily. 07/02/18 08/01/18  Gladstone Lighter, MD  Calcium Carbonate-Vitamin D (CALCIUM HIGH POTENCY/VITAMIN D) 600-200 MG-UNIT TABS Take 2 tablets by mouth daily.     [provider]  carboxymethylcellul-glycerin (OPTIVE) 0.5-0.9 % ophthalmic  solution Place 1 drop into both eyes 4 (four) times daily as needed for dry eyes.    [provider]  chlorpheniramine-HYDROcodone (TUSSIONEX PENNKINETIC ER) 10-8 MG/5ML SUER Take 5 mLs by mouth every 12 (twelve) hours as needed for cough. 07/02/18   Gladstone Lighter, MD  diltiazem (CARTIA XT) 240 MG 24 hr capsule Take 240 mg by mouth daily.  02/02/18   [provider]  escitalopram (LEXAPRO) 5 MG tablet Take 5 mg by mouth daily.  02/02/18   [provider]  fluticasone (FLONASE) 50 MCG/ACT nasal spray Place 1 spray into the nose daily as needed.  09/09/16   [provider]  loratadine (CLARITIN) 10 MG tablet Take 10 mg by mouth daily.     [provider]  omeprazole (PRILOSEC) 20 MG capsule Take 20 mg by mouth daily.    [provider]  ondansetron (ZOFRAN ODT) 4 MG disintegrating tablet Take 1 tablet (4 mg total) by mouth every 8 (eight) hours as needed for nausea or vomiting. Patient not taking: Reported on 05/26/2018 03/13/18   Merlyn Lot, MD    Allergies Amoxicillin-pot clavulanate, Cefprozil, Clarithromycin, Prednisone, Pseudoephedrine, Penicillins, and Sulfa antibiotics  Family History  Problem Relation Age of Onset  . Dementia Mother   . Heart attack Father     Social History Social History  Tobacco Use  . Smoking status: Never Smoker  . Smokeless tobacco: Never Used  Substance Use Topics  . Alcohol use: Never  . Drug use: Never    Review of Systems  Constitutional: No fever/chills! Eyes: No visual changes. ENT: No sore throat. Cardiovascular: Denies chest pain. Respiratory:  shortness of breath. Gastrointestinal: No abdominal pain.  No nausea, no vomiting.  No diarrhea.  No constipation. Genitourinary: Negative for dysuria. Musculoskeletal: Negative for back pain. Skin: Negative for rash. Neurological: Negative for headaches, focal weakness   ____________________________________________   PHYSICAL  EXAM:  VITAL SIGNS: ED Triage Vitals  Enc Vitals Group     BP 08/27/20 1449 (!) 145/81     Pulse Rate 08/27/20 1449 (!) 146     Resp 08/27/20 1449 20     Temp 08/27/20 1449 98.8 F (37.1 C)     Temp Source 08/27/20 1449 Oral     SpO2 08/27/20 1449 (!) 89 %     Weight 08/27/20 1454 144 lb 6.4 oz (65.5 kg)     Height 08/27/20 1454 5' 5.5" (1.664 m)     Head Circumference --      Peak Flow --      Pain Score 08/27/20 1454 0     Pain Loc --      Pain Edu? --      Excl. in [place]? --     Constitutional: Alert and oriented.  Is thin but not cachectic and has a deep wet cough which recurs over and over Eyes: Conjunctivae are normal.  Head: Atraumatic. Nose: No congestion/rhinnorhea. Mouth/Throat: Mucous membranes are moist.  Oropharynx non-erythematous. Neck: No stridor.  Cardiovascular: Normal rate, regular rhythm. Grossly normal heart sounds.  Good peripheral circulation. Respiratory: Normal respiratory effort.  No retractions. Lungs crackles and occasional wheezes on the right Gastrointestinal: Soft and nontender. No distention. No abdominal bruits. No CVA tenderness. Musculoskeletal: No lower extremity tenderness nor edema.  No joint effusions. Neurologic:  Normal speech and language. No gross focal neurologic deficits are appreciated.  Skin:  Skin is warm, dry and intact. No rash noted.   ____________________________________________   LABS (all labs ordered are listed, but only abnormal results are displayed)  Labs Reviewed  BASIC METABOLIC PANEL - Abnormal; Notable for the following components:      Result Value   Sodium 127 (*)    Chloride 90 (*)    Glucose, Bld 131 (*)    BUN 28 (*)    Creatinine, Ser 1.19 (*)    GFR, Estimated 43 (*)    All other components within normal limits  CBC - Abnormal; Notable for the following components:   WBC 25.7 (*)    RBC 5.33 (*)    Hemoglobin 15.8 (*)    HCT 46.9 (*)    Platelets 442 (*)    All other components within normal  limits  TROPONIN I (HIGH SENSITIVITY) - Abnormal; Notable for the following components:   Troponin I (High Sensitivity) 47 (*)    All other components within normal limits  RESP PANEL BY RT-PCR (FLU A&B, COVID) ARPGX2  CULTURE, BLOOD (SINGLE)  URINE CULTURE  MAGNESIUM  TSH  DIFFERENTIAL  PROCALCITONIN  BRAIN NATRIURETIC PEPTIDE  LACTIC ACID, PLASMA  LACTIC ACID, PLASMA  PROTIME-INR  APTT  URINALYSIS, COMPLETE (UACMP) WITH MICROSCOPIC  HEPATIC FUNCTION PANEL  TROPONIN I (HIGH SENSITIVITY)   ____________________________________________  EKG EKG read interpreted by me shows A. fib with rapid ventricular response rate of 132 normal axis nonspecific  ST-T wave changes  ____________________________________________  RADIOLOGY Gertha Calkin, personally viewed and evaluated these images (plain radiographs) as part of my medical decision making, as well as reviewing the written report by the radiologist.  ED MD interpretation: Chest x-ray read by radiology reviewed by me shows some right basilar patchy infiltrate CT read by radiologist and reviewed by me does not show any pulmonary emboli but that does show a multifocal pneumonia and mostly in the right lower lobe.  And could be due to aspiration.  Official radiology report(s): CT Angio Chest PE W and/or Wo Contrast  Result Date: 08/27/2020 CLINICAL DATA:  86 year old female with concern for pulmonary embolism. EXAM: CT ANGIOGRAPHY CHEST WITH CONTRAST TECHNIQUE: Multidetector CT imaging of the chest was performed using the standard protocol during bolus administration of intravenous contrast. Multiplanar CT image reconstructions and MIPs were obtained to evaluate the vascular anatomy. CONTRAST:  32mL OMNIPAQUE IOHEXOL 350 MG/ML SOLN COMPARISON:  Chest CT dated 03/05/2009 and radiograph dated 08/27/2020. FINDINGS: Cardiovascular: There is no cardiomegaly or pericardial effusion. There is coronary vascular calcification. Mild  atherosclerotic calcification of the thoracic aorta. No aneurysmal dilatation. No pulmonary artery embolus identified. Mediastinum/Nodes: Right hilar adenopathy measures 15 mm. Subcarinal lymph node measures 15 mm short axis. There is a small hiatal hernia. The esophagus is grossly unremarkable. No mediastinal fluid collection. Lungs/Pleura: Mild bilateral upper lobe bronchiectasis. There are clusters of nodular and streaky densities in the right upper lobe with areas of scarring. Findings consistent with acute pneumonia superimposed on background of chronic changes. Focal area of scarring noted in the lingula. There is an area of consolidative change at the right lung base. There is diffuse nodular densities throughout the right lung consistent with infection, possibly atypical in etiology or aspiration. Clinical correlation is recommended. There is peribronchial thickening primarily in the right lower lobe. No pleural effusion pneumothorax. The central airways are patent. Upper Abdomen: None Musculoskeletal: Degenerative changes of the spine. No acute osseous pathology. Review of the MIP images confirms the above findings. IMPRESSION: 1. No CT evidence of pulmonary embolism. 2. Multifocal pneumonia predominantly involving the right lower lobe, possibly atypical in etiology or aspiration. Clinical correlation and follow-up to resolution recommended. 3. Aortic Atherosclerosis (ICD10-I70.0). Electronically Signed   By: Anner Crete M.D.   On: 08/27/2020 16:30   DG Chest Portable 1 View  Result Date: 08/27/2020 CLINICAL DATA:  Shortness of breath, tachycardia EXAM: PORTABLE CHEST 1 VIEW COMPARISON:  Radiograph 06/30/2018, CT 03/05/2009 FINDINGS: Chronic hyperinflation and coarsened interstitial changes are again seen albeit with new areas of more patchy and consolidative opacity in the right suprahilar lung and right lung base with associated airways thickening. Left lung remains relatively clear. No  pneumothorax. Blunting of the right costophrenic sulcus could reflect scarring or trace effusion. Degenerative changes are present in the imaged spine and shoulders. Telemetry leads overlie the chest. IMPRESSION: 1. Areas of patchy and consolidative opacity in the right suprahilar lung and right lung base with associated airways thickening, concerning for pneumonia or sequela aspiration given history of dysphagia and esophageal spasm. 2. Chronic hyperinflation and coarsened interstitial changes. 3. Possible trace right pleural effusion or scarring. Electronically Signed   By: Lovena Le M.D.   On: 08/27/2020 15:28    ____________________________________________   PROCEDURES  Procedure(s) performed (including Critical Care): Local care time 35 minutes.  This includes reviewing the studies discussing what was going on with the patient and speaking with the hospitalist and reviewing her old records.  Procedures   ____________________________________________   INITIAL IMPRESSION / ASSESSMENT AND PLAN / ED COURSE  Patient's heart rate controlled with diltiazem IV.  O2 sats go up to 93-94 on 2 L nasal cannula.  She appears to have a community-acquired pneumonia which failed Ceftin and Levaquin however I will try Rocephin and Zithromax.  We may need to add clindamycin as well for the possibility of aspiration.  Her troponin is somewhat elevated at 47.  Her GFR is down at 43 this is new from her previous GFR.  I will put in sepsis orders.  Additionally she has acute kidney injury with a fall on her GFR from over 60-43.  This may be due to dehydration and she has not been eating or drinking well.  I have ordered already 1 L of saline I will give her a second 1 slightly slower at 500 an hour.  I have also ordered the lactic acid as part of the sepsis orders.              ____________________________________________   FINAL CLINICAL IMPRESSION(S) / ED DIAGNOSES  Final diagnoses:  Community  acquired pneumonia, unspecified laterality  Hypoxia  Atrial fibrillation with RVR ([place])  Hyponatremia  AKI (acute kidney injury) [facility])     ED Discharge Orders    None      *Please note:  [name] was evaluated in Emergency Department on 08/27/2020 for the symptoms described in the history of present illness. She was evaluated in the context of the global COVID-19 pandemic, which necessitated consideration that the patient might be at risk for infection with the SARS-CoV-2 virus that causes COVID-19. Institutional protocols and algorithms that pertain to the evaluation of patients at risk for COVID-19 are in a state of rapid change based on information released by regulatory bodies including the CDC and federal and state organizations. These policies and algorithms were followed during the patient's care in the ED.  Some ED evaluations and interventions may be delayed as a result of limited staffing during and the pandemic.*   Note:  This document was prepared using Dragon voice recognition software and may include unintentional dictation errors.    Nena Polio, MD 08/27/20 1713    Nena Polio, MD 08/27/20 1827

## 2020-08-27 NOTE — ED Notes (Signed)
Blood cultures order placed after antibiotics given in ED. Messaged admitting DO Chen, and verified that he still wanted the blood cultures sent. DO Chen verified that the blood cultures should still be collected even after antibiotics given.

## 2020-08-27 NOTE — ED Notes (Signed)
Pt given sandwich tray at [employer] at this time. Pt comfortable in bed, no further needs noted. Pt given TV remote.

## 2020-08-27 NOTE — Assessment & Plan Note (Addendum)
Admit to inpatient medical bed.  Patient will need for than 2 midnights for treatment.  This is due to need for IV antibiotics, supplemental oxygen.  Given the fact that she is ready failed pneumococcal quinolone and a second generation cephalosporin, will change to Zosyn, vancomycin and doxycycline.  Covid test is negative.  Send Legionella and strep pneumo antigens.  Blood cx were not ordered by ER until after pt's IV ABX were already given.

## 2020-08-27 NOTE — Assessment & Plan Note (Signed)
Stable.  We will hold her atenolol as she is now on IV Cardizem.

## 2020-08-27 NOTE — ED Notes (Signed)
Called pharmacy regarding missing dose of cefepime.. Per Manuela Schwartz in pharmacy, will send dose at this time

## 2020-08-27 NOTE — Assessment & Plan Note (Signed)
Start normal saline at 75 cc an hour.  Check TSH.

## 2020-08-27 NOTE — Progress Notes (Signed)
PHARMACIST - PHYSICIAN COMMUNICATION  CONCERNING:  Enoxaparin (Lovenox) for DVT Prophylaxis    RECOMMENDATION: Patient was prescribed enoxaprin 40mg  q24 hours for VTE prophylaxis.   Filed Weights   08/27/20 1454  Weight: 65.5 kg (144 lb 6.4 oz)    Body mass index is 23.66 kg/m.  Estimated Creatinine Clearance: 28.3 mL/min (A) (by C-G formula based on SCr of 1.19 mg/dL (H)).   Patient is candidate for enoxaparin 30mg  every 24 hours based on CrCl <33ml/min or Weight <45kg  DESCRIPTION: Pharmacy has adjusted enoxaparin dose per [facility] policy.  Patient is now receiving enoxaparin 30 mg every 24 hours    Berta Minor, PharmD Clinical Pharmacist  08/27/2020 6:45 PM

## 2020-08-27 NOTE — ED Notes (Signed)
Warm blanket given at this time

## 2020-08-27 NOTE — ED Triage Notes (Signed)
Patient to ER for c/o shortness of breath. Sent by Dr. Sabra Heck from [facility] to r/o PE. Patient tachycardic in triage with O2 sat at 89% on RA. Patient had similar sats (85-90% in office). Patient's notes state patient has had failure to thrive, decreased eating, and dyspnea on exertion at home. Lives by herself.

## 2020-08-27 NOTE — H&P (Addendum)
History and Physical    [name] JKK:[id number] DOB: 09/26/1927 DOA: 08/27/2020  PCP: Rusty Aus, MD   Patient coming from: Clinic  I have personally briefly reviewed patient's old medical records in [place]  CC: SOB, hypoxia, failed outpatient treatment for pneumonia HPI: 86 year old female with a history of hypertension, reflux who presents to the hospital ER from the PCP office.  Patient's had a 2-week history of cough, congestion.  Patient states that she normally gets an upper respiratory infection in November.  Patient has been on Levaquin and prednisone.  This was changed to Ceftin and prednisone.  She followed up with her PCP in the office today.  She was noted to be hypoxic with room air saturations in the mid 80s.  She was also tachycardic.  She was sent to the ER for evaluation.  In the ER, EKG demonstrated new onset rapid atrial fibrillation.  She also had a CT scan to rule out PE.  There was no PE.  She did have a dense right lower lobe pneumonia.  White count was elevated 25,000.  She was also hypoxic.  She also was hyponatremic with a serum sodium of 127.  Patient states that she has felt weak.  She had one episode of vomiting today.  Patient lives alone.  She still drives her car.  She is independent with ADLs.  Due to the patient's failed outpatient treatment for pneumonia, acute hypoxic respiratory failure, hyponatremia and sepsis, patient will be admitted to the hospital.    ED Course: started on IVF, IV cardizem gtts. Blood cx obtained. IV ABX started.  Review of Systems:  Review of Systems  Constitutional: Positive for fever and malaise/fatigue.  HENT: Negative.   Eyes: Negative.   Respiratory: Positive for cough, sputum production and shortness of breath.   Cardiovascular: Positive for palpitations and leg swelling.  Gastrointestinal: Positive for vomiting. Negative for abdominal pain and diarrhea.  Genitourinary: Negative.   Musculoskeletal:  Negative.   Skin: Negative.   Neurological: Positive for weakness.  Endo/Heme/Allergies: Negative.   Psychiatric/Behavioral: Negative.   All other systems reviewed and are negative.   Past Medical History:  Diagnosis Date  . Allergic rhinitis   . Arthritis   . Colon polyp   . Edema   . Esophageal spasm   . GERD (gastroesophageal reflux disease)   . Hypertension     Past Surgical History:  Procedure Laterality Date  . APPENDECTOMY    . CATARACT EXTRACTION    . dilatation and curatage    . EYE SURGERY    . HYSTEROTOMY    . OOPHORECTOMY    . TONSILLECTOMY       reports that she has never smoked. She has never used smokeless tobacco. She reports that she does not drink alcohol and does not use drugs.  Allergies  Allergen Reactions  . Amoxicillin-Pot Clavulanate Nausea Only  . Cefprozil Nausea Only  . Clarithromycin Nausea Only  . Prednisone     Other reaction(s): Other (See Comments) Hyperglycemia  . Pseudoephedrine     Other reaction(s): Unknown  . Penicillins Rash  . Sulfa Antibiotics Rash    Family History  Problem Relation Age of Onset  . Dementia Mother   . Heart attack Father     Prior to Admission medications   Medication Sig Start Date End Date Taking? Authorizing Provider  atenolol (TENORMIN) 25 MG tablet Take 1 tablet (25 mg total) by mouth daily. 07/02/18 08/01/18  Gladstone Lighter, MD  Calcium Carbonate-Vitamin D (CALCIUM HIGH POTENCY/VITAMIN D) 600-200 MG-UNIT TABS Take 2 tablets by mouth daily.     [provider]  carboxymethylcellul-glycerin (OPTIVE) 0.5-0.9 % ophthalmic solution Place 1 drop into both eyes 4 (four) times daily as needed for dry eyes.    [provider]  chlorpheniramine-HYDROcodone (TUSSIONEX PENNKINETIC ER) 10-8 MG/5ML SUER Take 5 mLs by mouth every 12 (twelve) hours as needed for cough. 07/02/18   Gladstone Lighter, MD  diltiazem (CARTIA XT) 240 MG 24 hr capsule Take 240 mg by mouth daily.  02/02/18   [provider]  escitalopram (LEXAPRO) 5 MG tablet Take 5 mg by mouth daily.  02/02/18   [provider]  fluticasone (FLONASE) 50 MCG/ACT nasal spray Place 1 spray into the nose daily as needed.  09/09/16   [provider]  loratadine (CLARITIN) 10 MG tablet Take 10 mg by mouth daily.     [provider]  omeprazole (PRILOSEC) 20 MG capsule Take 20 mg by mouth daily.    [provider]  ondansetron (ZOFRAN ODT) 4 MG disintegrating tablet Take 1 tablet (4 mg total) by mouth every 8 (eight) hours as needed for nausea or vomiting. Patient not taking: Reported on 05/26/2018 03/13/18   Merlyn Lot, MD    Physical Exam: Vitals:   08/27/20 1600 08/27/20 1645 08/27/20 1700 08/27/20 1730  BP: 140/78 (!) 145/77 136/73 (!) 129/92  Pulse: 100 (!) 111 96 97  Resp: (!) 25 (!) 32 (!) 26 14  Temp:      TempSrc:      SpO2: 96% 96% 96% 98%  Weight:      Height:        Physical Exam Vitals and nursing note reviewed.  Constitutional:      General: She is not in acute distress.    Appearance: Normal appearance. She is normal weight. She is not ill-appearing, toxic-appearing or diaphoretic.     Comments: Pt looks like she does not feel well.  HENT:     Head: Normocephalic and atraumatic.  Eyes:     General: No scleral icterus.       Right eye: No discharge.        Left eye: No discharge.  Cardiovascular:     Rate and Rhythm: Tachycardia present. Rhythm irregular.  Pulmonary:     Breath sounds: Examination of the right-lower field reveals wheezing, rhonchi and rales. Wheezing, rhonchi and rales present.  Abdominal:     General: Abdomen is flat. Bowel sounds are normal. There is no distension.     Palpations: Abdomen is soft.     Tenderness: There is no abdominal tenderness. There is no guarding or rebound.  Musculoskeletal:     Right lower leg: 1+ Pitting Edema present.     Left lower leg: 1+ Pitting Edema present.  Skin:    General: Skin is warm and dry.      Capillary Refill: Capillary refill takes less than 2 seconds.  Neurological:     General: No focal deficit present.     Mental Status: She is alert and oriented to person, place, and time.      Labs on Admission: I have personally reviewed following labs and imaging studies  CBC: Recent Labs  Lab 08/27/20 1501  WBC 25.7*  HGB 15.8*  HCT 46.9*  MCV 88.0  PLT 606*   Basic Metabolic Panel: Recent Labs  Lab 08/27/20 1501 08/27/20 1507  NA 127*  --   K 4.3  --  CL 90*  --   CO2 24  --   GLUCOSE 131*  --   BUN 28*  --   CREATININE 1.19*  --   CALCIUM 9.9  --   MG  --  1.7   GFR: Estimated Creatinine Clearance: 28.3 mL/min (A) (by C-G formula based on SCr of 1.19 mg/dL (H)). Liver Function Tests: No results for input(s): AST, ALT, ALKPHOS, BILITOT, PROT, ALBUMIN in the last 168 hours. No results for input(s): LIPASE, AMYLASE in the last 168 hours. No results for input(s): AMMONIA in the last 168 hours. Coagulation Profile: No results for input(s): INR, PROTIME in the last 168 hours. Cardiac Enzymes: No results for input(s): CKTOTAL, CKMB, CKMBINDEX, TROPONINI in the last 168 hours. BNP (last 3 results) No results for input(s): PROBNP in the last 8760 hours. HbA1C: No results for input(s): HGBA1C in the last 72 hours. CBG: No results for input(s): GLUCAP in the last 168 hours. Lipid Profile: No results for input(s): CHOL, HDL, LDLCALC, TRIG, CHOLHDL, LDLDIRECT in the last 72 hours. Thyroid Function Tests: Recent Labs    08/27/20 1507  TSH 1.292   Anemia Panel: No results for input(s): VITAMINB12, FOLATE, FERRITIN, TIBC, IRON, RETICCTPCT in the last 72 hours. Urine analysis:    Component Value Date/Time   COLORURINE STRAW (A) 03/13/2018 1848   APPEARANCEUR CLEAR (A) 03/13/2018 1848   APPEARANCEUR Clear 02/20/2013 1404   LABSPEC 1.006 03/13/2018 1848   LABSPEC 1.006 02/20/2013 1404   PHURINE 5.0 03/13/2018 1848   GLUCOSEU NEGATIVE 03/13/2018 1848    GLUCOSEU >=500 02/20/2013 1404   HGBUR NEGATIVE 03/13/2018 1848   BILIRUBINUR NEGATIVE 03/13/2018 1848   BILIRUBINUR Negative 02/20/2013 1404   KETONESUR NEGATIVE 03/13/2018 1848   PROTEINUR NEGATIVE 03/13/2018 1848   NITRITE NEGATIVE 03/13/2018 1848   LEUKOCYTESUR NEGATIVE 03/13/2018 1848   LEUKOCYTESUR Negative 02/20/2013 1404    Radiological Exams on Admission: I have personally reviewed images CT Angio Chest PE W and/or Wo Contrast  Result Date: 08/27/2020 CLINICAL DATA:  86 year old female with concern for pulmonary embolism. EXAM: CT ANGIOGRAPHY CHEST WITH CONTRAST TECHNIQUE: Multidetector CT imaging of the chest was performed using the standard protocol during bolus administration of intravenous contrast. Multiplanar CT image reconstructions and MIPs were obtained to evaluate the vascular anatomy. CONTRAST:  5mL OMNIPAQUE IOHEXOL 350 MG/ML SOLN COMPARISON:  Chest CT dated 03/05/2009 and radiograph dated 08/27/2020. FINDINGS: Cardiovascular: There is no cardiomegaly or pericardial effusion. There is coronary vascular calcification. Mild atherosclerotic calcification of the thoracic aorta. No aneurysmal dilatation. No pulmonary artery embolus identified. Mediastinum/Nodes: Right hilar adenopathy measures 15 mm. Subcarinal lymph node measures 15 mm short axis. There is a small hiatal hernia. The esophagus is grossly unremarkable. No mediastinal fluid collection. Lungs/Pleura: Mild bilateral upper lobe bronchiectasis. There are clusters of nodular and streaky densities in the right upper lobe with areas of scarring. Findings consistent with acute pneumonia superimposed on background of chronic changes. Focal area of scarring noted in the lingula. There is an area of consolidative change at the right lung base. There is diffuse nodular densities throughout the right lung consistent with infection, possibly atypical in etiology or aspiration. Clinical correlation is recommended. There is  peribronchial thickening primarily in the right lower lobe. No pleural effusion pneumothorax. The central airways are patent. Upper Abdomen: None Musculoskeletal: Degenerative changes of the spine. No acute osseous pathology. Review of the MIP images confirms the above findings. IMPRESSION: 1. No CT evidence of pulmonary embolism. 2. Multifocal pneumonia predominantly involving the  right lower lobe, possibly atypical in etiology or aspiration. Clinical correlation and follow-up to resolution recommended. 3. Aortic Atherosclerosis (ICD10-I70.0). Electronically Signed   By: Anner Crete M.D.   On: 08/27/2020 16:30   DG Chest Portable 1 View  Result Date: 08/27/2020 CLINICAL DATA:  Shortness of breath, tachycardia EXAM: PORTABLE CHEST 1 VIEW COMPARISON:  Radiograph 06/30/2018, CT 03/05/2009 FINDINGS: Chronic hyperinflation and coarsened interstitial changes are again seen albeit with new areas of more patchy and consolidative opacity in the right suprahilar lung and right lung base with associated airways thickening. Left lung remains relatively clear. No pneumothorax. Blunting of the right costophrenic sulcus could reflect scarring or trace effusion. Degenerative changes are present in the imaged spine and shoulders. Telemetry leads overlie the chest. IMPRESSION: 1. Areas of patchy and consolidative opacity in the right suprahilar lung and right lung base with associated airways thickening, concerning for pneumonia or sequela aspiration given history of dysphagia and esophageal spasm. 2. Chronic hyperinflation and coarsened interstitial changes. 3. Possible trace right pleural effusion or scarring. Electronically Signed   By: Lovena Le M.D.   On: 08/27/2020 15:28    EKG: I have personally reviewed EKG: rapid afib   86 yo WF admitted for RRL pneumonia, acute respiratory failure with hypoxia, hyponatremia, sepsis due to pneumonia.  Assessment/Plan Principal Problem:   RLL pneumonia Active  Problems:   Acute respiratory failure with hypoxia (HCC)   Hyponatremia   Sepsis due to pneumonia (HCC)   Rapid atrial fibrillation (HCC)   GERD (gastroesophageal reflux disease)   HTN (hypertension), benign   DNR (do not resuscitate)/DNI(Do Not Intubate)    RLL pneumonia Admit to inpatient medical bed.  Patient will need for than 2 midnights for treatment.  This is due to need for IV antibiotics, supplemental oxygen.  Given the fact that she is ready failed pneumococcal quinolone and a second generation cephalosporin, will change to Zosyn, vancomycin and doxycycline.  Covid test is negative.  Send Legionella and strep pneumo antigens.  Blood cx were not ordered by ER until after pt's IV ABX were already given.  Acute respiratory failure with hypoxia (HCC) Continue supplemental oxygen.  Hyponatremia Start normal saline at 75 cc an hour.  Check TSH.  Sepsis due to pneumonia ([place]) Sepsis is due to pneumonia.  She has acute kidney injury.  She has an elevated white count of 25,000.  She has evidence of acute organ dysfunction  Rapid atrial fibrillation (HCC) Continue IV Cardizem.  Check echo.  Start baby aspirin.  GERD (gastroesophageal reflux disease) Stable.  Continue PPI.  HTN (hypertension), benign Stable.  We will hold her atenolol as she is now on IV Cardizem.  DNR (do not resuscitate)/DNI(Do Not Intubate) Verified with the patient that she is a DNR and DNI.  She lives at home by herself.  Her sister is her Chartered certified accountant.   DVT prophylaxis: Lovenox Code Status: DNR/DNI(Do NOT Intubate) Family Communication: no family at bedside  Disposition Plan: DC to home +/- home health  Consults called: none  Admission status: Inpatient, Telemetry bed   Kristopher Oppenheim, DO [facility] 08/27/2020, 5:39 PM

## 2020-08-27 NOTE — Assessment & Plan Note (Signed)
Continue IV Cardizem.  Check echo.  Start baby aspirin.

## 2020-08-27 NOTE — Assessment & Plan Note (Signed)
Stable.  Continue PPI. 

## 2020-08-27 NOTE — Assessment & Plan Note (Signed)
Verified with the patient that she is a DNR and DNI.  She lives at home by herself.  Her sister is her Chartered certified accountant.

## 2020-08-27 NOTE — Progress Notes (Signed)
Pharmacy Antibiotic Note  [name] is a 86 y.o. female admitted on 08/27/2020 with RLL pneumonia/sepsis.  Pharmacy has been consulted for Vancomycin and Cefepime dosing. -per H&P -outpatient : Patient has been on Levaquin and prednisone.  This was changed to Ceftin and prednisone.   Plan: -Vanc: patient to receive Vancomycin 1500 mg Loading dose x 1 then continue with Vancomycin 750 IV every 24 hours.  Goal trough 15-20 mcg/mL.  Wt 65.5 kg  Crcl 28 ml/min.  Watch Scr, check MRSA PCR  -Cefepime: 2 gm IV q24h    Height: 5' 5.5" (166.4 cm) Weight: 65.5 kg (144 lb 6.4 oz) IBW/kg (Calculated) : 58.15  Temp (24hrs), Avg:98.8 F (37.1 C), Min:98.8 F (37.1 C), Max:98.8 F (37.1 C)  Recent Labs  Lab 08/27/20 1501  WBC 25.7*  CREATININE 1.19*    Estimated Creatinine Clearance: 28.3 mL/min (A) (by C-G formula based on SCr of 1.19 mg/dL (H)).    Allergies  Allergen Reactions  . Amoxicillin-Pot Clavulanate Nausea Only  . Cefprozil Nausea Only  . Clarithromycin Nausea Only  . Prednisone     Other reaction(s): Other (See Comments) Hyperglycemia  . Pseudoephedrine     Other reaction(s): Unknown  . Penicillins Rash  . Sulfa Antibiotics Rash    Antimicrobials this admission: Azith/CTX x 1 dose each in ED 11/29 Vanc 11/29 >>   Cefepime 11/29 >>    Dose adjustments this admission:    Microbiology results: 11/29 BCx: pend  (drawn after abx per H&P) 11/29 UCx: pend    Sputum:     11/29 MRSA PCR:  pending  Thank you for allowing pharmacy to be a part of this patient's care.  Rosmery Duggin A 08/27/2020 5:43 PM

## 2020-08-27 NOTE — Subjective & Objective (Signed)
CC: SOB, hypoxia, failed outpatient treatment for pneumonia HPI: 86 year old female with a history of hypertension, reflux who presents to the hospital ER from the PCP office.  Patient's had a 2-week history of cough, congestion.  Patient states that she normally gets an upper respiratory infection in November.  Patient has been on Levaquin and prednisone.  This was changed to Ceftin and prednisone.  She followed up with her PCP in the office today.  She was noted to be hypoxic with room air saturations in the mid 80s.  She was also tachycardic.  She was sent to the ER for evaluation.  In the ER, EKG demonstrated new onset rapid atrial fibrillation.  She also had a CT scan to rule out PE.  There was no PE.  She did have a dense right lower lobe pneumonia.  White count was elevated 25,000.  She was also hypoxic.  She also was hyponatremic with a serum sodium of 127.  Patient states that she has felt weak.  She had one episode of vomiting today.  Patient lives alone.  She still drives her car.  She is independent with ADLs.  Due to the patient's failed outpatient treatment for pneumonia, acute hypoxic respiratory failure, hyponatremia and sepsis, patient will be admitted to the hospital.

## 2020-08-27 NOTE — ED Notes (Signed)
Pt assisted to bedside toilet at this time. Pt tolerated well with this RN's assistance. This RN talked to patient about going on purwick for ease of using the bathroom and to reserve energy. Pt agrees at this time, this RN places patient on purwick and educates patient on usage. No further needs noted, pt clean and dry at this time.

## 2020-08-28 ENCOUNTER — Inpatient Hospital Stay: Admit: 2020-08-28 | Payer: Medicare HMO

## 2020-08-28 ENCOUNTER — Inpatient Hospital Stay (HOSPITAL_COMMUNITY)
Admit: 2020-08-28 | Discharge: 2020-08-28 | Disposition: A | Discharge status: Home / Self Care | Payer: Medicare HMO | Attending: Internal Medicine | Admitting: Internal Medicine

## 2020-08-28 DIAGNOSIS — K219 Gastro-esophageal reflux disease without esophagitis: Secondary | ICD-10-CM | POA: Diagnosis not present

## 2020-08-28 DIAGNOSIS — I4891 Unspecified atrial fibrillation: Secondary | ICD-10-CM | POA: Diagnosis not present

## 2020-08-28 DIAGNOSIS — J189 Pneumonia, unspecified organism: Secondary | ICD-10-CM | POA: Diagnosis not present

## 2020-08-28 DIAGNOSIS — R0902 Hypoxemia: Secondary | ICD-10-CM

## 2020-08-28 LAB — CBC WITH DIFFERENTIAL/PLATELET
Abs Immature Granulocytes: 0.5 10*3/uL — ABNORMAL HIGH (ref 0.00–0.07)
Basophils Absolute: 0.1 10*3/uL (ref 0.0–0.1)
Basophils Relative: 0 %
Eosinophils Absolute: 0 10*3/uL (ref 0.0–0.5)
Eosinophils Relative: 0 %
HCT: 37.1 % (ref 36.0–46.0)
Hemoglobin: 12.4 g/dL (ref 12.0–15.0)
Immature Granulocytes: 3 %
Lymphocytes Relative: 9 %
Lymphs Abs: 1.6 10*3/uL (ref 0.7–4.0)
MCH: 29.8 pg (ref 26.0–34.0)
MCHC: 33.4 g/dL (ref 30.0–36.0)
MCV: 89.2 fL (ref 80.0–100.0)
Monocytes Absolute: 2.1 10*3/uL — ABNORMAL HIGH (ref 0.1–1.0)
Monocytes Relative: 11 %
Neutro Abs: 14.3 10*3/uL — ABNORMAL HIGH (ref 1.7–7.7)
Neutrophils Relative %: 77 %
Platelets: 315 10*3/uL (ref 150–400)
RBC: 4.16 MIL/uL (ref 3.87–5.11)
RDW: 14.1 % (ref 11.5–15.5)
WBC: 18.6 10*3/uL — ABNORMAL HIGH (ref 4.0–10.5)
nRBC: 0 % (ref 0.0–0.2)

## 2020-08-28 LAB — COMPREHENSIVE METABOLIC PANEL
ALT: 16 U/L (ref 0–44)
AST: 19 U/L (ref 15–41)
Albumin: 2.6 g/dL — ABNORMAL LOW (ref 3.5–5.0)
Alkaline Phosphatase: 53 U/L (ref 38–126)
Anion gap: 10 (ref 5–15)
BUN: 20 mg/dL (ref 8–23)
CO2: 21 mmol/L — ABNORMAL LOW (ref 22–32)
Calcium: 8.7 mg/dL — ABNORMAL LOW (ref 8.9–10.3)
Chloride: 102 mmol/L (ref 98–111)
Creatinine, Ser: 0.95 mg/dL (ref 0.44–1.00)
GFR, Estimated: 57 mL/min — ABNORMAL LOW (ref >60)
Glucose, Bld: 111 mg/dL — ABNORMAL HIGH (ref 70–99)
Potassium: 3.3 mmol/L — ABNORMAL LOW (ref 3.5–5.1)
Sodium: 133 mmol/L — ABNORMAL LOW (ref 135–145)
Total Bilirubin: 0.8 mg/dL (ref 0.3–1.2)
Total Protein: 5.4 g/dL — ABNORMAL LOW (ref 6.5–8.1)

## 2020-08-28 LAB — MRSA PCR SCREENING: MRSA by PCR: NEGATIVE

## 2020-08-28 MED ORDER — POLYVINYL ALCOHOL 1.4 % OP SOLN
1.0000 [drp] | Freq: Four times a day (QID) | OPHTHALMIC | Status: DC | PRN
  Filled 2020-08-28: qty 15

## 2020-08-28 MED ORDER — POTASSIUM CHLORIDE CRYS ER 20 MEQ PO TBCR
20.0000 meq | EXTENDED_RELEASE_TABLET | ORAL | Status: AC
  Administered 2020-08-28 (×2): 20 meq via ORAL
  Filled 2020-08-28 (×2): qty 1

## 2020-08-28 MED ORDER — DILTIAZEM HCL ER COATED BEADS 120 MG PO CP24
240.0000 mg | ORAL_CAPSULE | Freq: Every day | ORAL | Status: DC
  Administered 2020-08-29 – 2020-09-03 (×6): 240 mg via ORAL
  Filled 2020-08-28 (×6): qty 2

## 2020-08-28 MED ORDER — SODIUM CHLORIDE 0.9% FLUSH
3.0000 mL | Freq: Two times a day (BID) | INTRAVENOUS | Status: DC
  Administered 2020-08-28 – 2020-09-03 (×12): 3 mL via INTRAVENOUS

## 2020-08-28 MED ORDER — CALCIUM CARBONATE-VITAMIN D 500-200 MG-UNIT PO TABS
2.0000 | ORAL_TABLET | Freq: Every day | ORAL | Status: DC
  Administered 2020-08-28 – 2020-09-03 (×7): 2 via ORAL
  Filled 2020-08-28 (×8): qty 2

## 2020-08-28 MED ORDER — SODIUM CHLORIDE 0.9 % IV SOLN
INTRAVENOUS | Status: DC | PRN
  Administered 2020-08-28: 500 mL via INTRAVENOUS
  Administered 2020-08-29: 250 mL via INTRAVENOUS

## 2020-08-28 MED ORDER — ESCITALOPRAM OXALATE 10 MG PO TABS
5.0000 mg | ORAL_TABLET | Freq: Every day | ORAL | Status: DC
  Administered 2020-08-28 – 2020-09-03 (×7): 5 mg via ORAL
  Filled 2020-08-28 (×7): qty 0.5

## 2020-08-28 MED ORDER — SODIUM CHLORIDE 0.9 % IV SOLN
2.0000 g | Freq: Two times a day (BID) | INTRAVENOUS | Status: AC
  Administered 2020-08-28 – 2020-08-31 (×8): 2 g via INTRAVENOUS
  Filled 2020-08-28 (×10): qty 2

## 2020-08-28 MED ORDER — PANTOPRAZOLE SODIUM 40 MG PO TBEC
40.0000 mg | DELAYED_RELEASE_TABLET | Freq: Every day | ORAL | Status: DC
  Administered 2020-08-28 – 2020-09-03 (×7): 40 mg via ORAL
  Filled 2020-08-28 (×7): qty 1

## 2020-08-28 MED ORDER — NYSTATIN 100000 UNIT/ML MT SUSP
5.0000 mL | Freq: Four times a day (QID) | OROMUCOSAL | Status: DC
  Administered 2020-08-28 – 2020-09-03 (×23): 500000 [IU] via OROMUCOSAL
  Filled 2020-08-28 (×23): qty 5

## 2020-08-28 MED ORDER — ENOXAPARIN SODIUM 40 MG/0.4ML ~~LOC~~ SOLN
40.0000 mg | SUBCUTANEOUS | Status: DC
  Administered 2020-08-28: 40 mg via SUBCUTANEOUS
  Filled 2020-08-28: qty 0.4

## 2020-08-28 NOTE — Consult Note (Signed)
[facility] Consultation Note  Patient ID: [name], MRN: [id number], DOB/AGE: Feb 09, 1927 86 y.o. Admit date: 08/27/2020   Date of Consult: 08/28/2020 Primary Physician: Rusty Aus, MD Primary Cardiologist: None  Chief Complaint:  Chief Complaint  Patient presents with  . Shortness of Breath   Reason for Consult: Atrial fibrillation  HPI: 86 y.o. female with no evidence of previous cardiovascular disease but borderline hypertension having significant increased risk of shortness of breath and cough and congestion. When patient was driving a chest x-ray had shown right lower lobe pneumonia with a BNP of 152 and a troponin of 40. The patient was placed on appropriate antibiotics and inhalers with pulmonary toilet and has felt slightly better with oxygenation. Incidentally the patient also had an EKG showing atrial fibrillation with rapid ventricular rate and nonspecific ST changes. She was placed on a diltiazem drip with reasonable heart rate control and now with heart rate control of 80 bpm. She has been transitioned now to oral diltiazem at 240 mg with continued heart rate control. The patient has been concerned about aspirin versus anticoagulation for atrial fibrillation and will continue to discussed the risks and benefits of this treatment. There is no current evidence of congestive heart failure acute coronary syndrome anginal symptoms concerns for fall risk and for concern for major bleeding risk. Therefore further treatment options are available  Past Medical History:  Diagnosis Date  . Allergic rhinitis   . Arthritis   . Colon polyp   . Edema   . Esophageal spasm   . GERD (gastroesophageal reflux disease)   . Hypertension       Surgical History:  Past Surgical History:  Procedure Laterality Date  . APPENDECTOMY    . CATARACT EXTRACTION    . dilatation and curatage    . EYE SURGERY    . HYSTEROTOMY    . OOPHORECTOMY    . TONSILLECTOMY       Home  Meds: Prior to Admission medications   Medication Sig Start Date End Date Taking? Authorizing Provider  Calcium Carbonate-Vitamin D (CALCIUM HIGH POTENCY/VITAMIN D) 600-200 MG-UNIT TABS Take 2 tablets by mouth daily.    Yes [provider]  carboxymethylcellul-glycerin (OPTIVE) 0.5-0.9 % ophthalmic solution Place 1 drop into both eyes 4 (four) times daily as needed for dry eyes.   Yes [provider]  cefUROXime (CEFTIN) 500 MG tablet Take 500 mg by mouth 2 (two) times daily. 08/21/20  Yes [provider]  cetirizine (ZYRTEC) 10 MG tablet Take 10 mg by mouth at bedtime.   Yes [provider]  diltiazem (CARTIA XT) 240 MG 24 hr capsule Take 240 mg by mouth daily.  02/02/18  Yes [provider]  escitalopram (LEXAPRO) 5 MG tablet Take 5 mg by mouth in the morning and at bedtime.  02/02/18  Yes [provider]  fluticasone (FLONASE) 50 MCG/ACT nasal spray Place 1 spray into the nose daily as needed.  09/09/16  Yes [provider]  furosemide (LASIX) 20 MG tablet Take 20 mg by mouth daily.  08/20/20  Yes [provider]  ipratropium-albuterol (DUONEB) 0.5-2.5 (3) MG/3ML SOLN Inhale 3 mLs into the lungs 3 (three) times daily. 07/06/20  Yes [provider]  omeprazole (PRILOSEC) 20 MG capsule Take 20 mg by mouth daily.   Yes [provider]  potassium chloride SA (KLOR-CON) 20 MEQ tablet Take 20 mEq by mouth 3 (three) times daily. 06/25/20  Yes [provider]  triamterene-hydrochlorothiazide Jacklynn Lewis)  37.5-25 MG capsule Take 1 capsule by mouth daily. 07/10/20  Yes [provider]    Inpatient Medications:  . aspirin EC  81 mg Oral Daily  . calcium-vitamin D  2 tablet Oral Daily  . [START ON 08/29/2020] diltiazem  240 mg Oral Daily  . enoxaparin (LOVENOX) injection  40 mg Subcutaneous Q24H  . escitalopram  5 mg Oral Daily  . nystatin  5 mL Mouth/Throat QID  . pantoprazole  40 mg Oral Daily  .  potassium chloride  20 mEq Oral Q2H   . 0.9 % NaCl with KCl 20 mEq / L 75 mL/hr at 08/28/20 0819  . ceFEPime (MAXIPIME) IV 2 g (08/28/20 1415)    Allergies:  Allergies  Allergen Reactions  . Amoxicillin-Pot Clavulanate Nausea Only  . Cefprozil Nausea Only  . Clarithromycin Nausea Only  . Prednisone     Other reaction(s): Other (See Comments) Hyperglycemia  . Pseudoephedrine     Other reaction(s): Unknown  . Penicillins Rash  . Sulfa Antibiotics Rash    Social History   Socioeconomic History  . Marital status: Widowed    Spouse name: Not on file  . Number of children: Not on file  . Years of education: Not on file  . Highest education level: Not on file  Occupational History  . Not on file  Tobacco Use  . Smoking status: Never Smoker  . Smokeless tobacco: Never Used  Substance and Sexual Activity  . Alcohol use: Never  . Drug use: Never  . Sexual activity: Not on file  Other Topics Concern  . Not on file  Social History Narrative  . Not on file   Social Determinants of Health   Financial Resource Strain:   . Difficulty of Paying Living Expenses: Not on file  Food Insecurity:   . Worried About Charity fundraiser in the Last Year: Not on file  . Ran Out of Food in the Last Year: Not on file  Transportation Needs:   . Lack of Transportation (Medical): Not on file  . Lack of Transportation (Non-Medical): Not on file  Physical Activity:   . Days of Exercise per Week: Not on file  . Minutes of Exercise per Session: Not on file  Stress:   . Feeling of Stress : Not on file  Social Connections:   . Frequency of Communication with Friends and Family: Not on file  . Frequency of Social Gatherings with Friends and Family: Not on file  . Attends Religious Services: Not on file  . Active Member of Clubs or Organizations: Not on file  . Attends Archivist Meetings: Not on file  . Marital Status: Not on file  Intimate Partner Violence:   . Fear of Current  or Ex-Partner: Not on file  . Emotionally Abused: Not on file  . Physically Abused: Not on file  . Sexually Abused: Not on file     Family History  Problem Relation Age of Onset  . Dementia Mother   . Heart attack Father      Review of Systems Positive for cough congestion Negative for: General:  chills, fever, night sweats or weight changes.  Cardiovascular: PND orthopnea syncope dizziness  Dermatological skin lesions rashes Respiratory: Positive for cough congestion Urologic: Frequent urination urination at night and hematuria Abdominal: negative for nausea, vomiting, diarrhea, bright red blood per rectum, melena, or hematemesis Neurologic: negative for visual changes, and/or hearing changes  All other systems reviewed and are otherwise negative except  as noted above.  Labs: No results for input(s): CKTOTAL, CKMB, TROPONINI in the last 72 hours. Lab Results  Component Value Date   WBC 18.6 (H) 08/28/2020   HGB 12.4 08/28/2020   HCT 37.1 08/28/2020   MCV 89.2 08/28/2020   PLT 315 08/28/2020    Recent Labs  Lab 08/28/20 0503  NA 133*  K 3.3*  CL 102  CO2 21*  BUN 20  CREATININE 0.95  CALCIUM 8.7*  PROT 5.4*  BILITOT 0.8  ALKPHOS 53  ALT 16  AST 19  GLUCOSE 111*   No results found for: CHOL, HDL, LDLCALC, TRIG No results found for: DDIMER  Radiology/Studies:  CT Angio Chest PE W and/or Wo Contrast  Result Date: 08/27/2020 CLINICAL DATA:  86 year old female with concern for pulmonary embolism. EXAM: CT ANGIOGRAPHY CHEST WITH CONTRAST TECHNIQUE: Multidetector CT imaging of the chest was performed using the standard protocol during bolus administration of intravenous contrast. Multiplanar CT image reconstructions and MIPs were obtained to evaluate the vascular anatomy. CONTRAST:  28mL OMNIPAQUE IOHEXOL 350 MG/ML SOLN COMPARISON:  Chest CT dated 03/05/2009 and radiograph dated 08/27/2020. FINDINGS: Cardiovascular: There is no cardiomegaly or pericardial effusion.  There is coronary vascular calcification. Mild atherosclerotic calcification of the thoracic aorta. No aneurysmal dilatation. No pulmonary artery embolus identified. Mediastinum/Nodes: Right hilar adenopathy measures 15 mm. Subcarinal lymph node measures 15 mm short axis. There is a small hiatal hernia. The esophagus is grossly unremarkable. No mediastinal fluid collection. Lungs/Pleura: Mild bilateral upper lobe bronchiectasis. There are clusters of nodular and streaky densities in the right upper lobe with areas of scarring. Findings consistent with acute pneumonia superimposed on background of chronic changes. Focal area of scarring noted in the lingula. There is an area of consolidative change at the right lung base. There is diffuse nodular densities throughout the right lung consistent with infection, possibly atypical in etiology or aspiration. Clinical correlation is recommended. There is peribronchial thickening primarily in the right lower lobe. No pleural effusion pneumothorax. The central airways are patent. Upper Abdomen: None Musculoskeletal: Degenerative changes of the spine. No acute osseous pathology. Review of the MIP images confirms the above findings. IMPRESSION: 1. No CT evidence of pulmonary embolism. 2. Multifocal pneumonia predominantly involving the right lower lobe, possibly atypical in etiology or aspiration. Clinical correlation and follow-up to resolution recommended. 3. Aortic Atherosclerosis (ICD10-I70.0). Electronically Signed   By: Anner Crete M.D.   On: 08/27/2020 16:30   DG Chest Portable 1 View  Result Date: 08/27/2020 CLINICAL DATA:  Shortness of breath, tachycardia EXAM: PORTABLE CHEST 1 VIEW COMPARISON:  Radiograph 06/30/2018, CT 03/05/2009 FINDINGS: Chronic hyperinflation and coarsened interstitial changes are again seen albeit with new areas of more patchy and consolidative opacity in the right suprahilar lung and right lung base with associated airways thickening.  Left lung remains relatively clear. No pneumothorax. Blunting of the right costophrenic sulcus could reflect scarring or trace effusion. Degenerative changes are present in the imaged spine and shoulders. Telemetry leads overlie the chest. IMPRESSION: 1. Areas of patchy and consolidative opacity in the right suprahilar lung and right lung base with associated airways thickening, concerning for pneumonia or sequela aspiration given history of dysphagia and esophageal spasm. 2. Chronic hyperinflation and coarsened interstitial changes. 3. Possible trace right pleural effusion or scarring. Electronically Signed   By: Lovena Le M.D.   On: 08/27/2020 15:28    EKG: Atrial fibrillation with rapid ventricular rate  Weights: Filed Weights   08/27/20 1454 08/28/20 0431  Weight:  65.5 kg 71.1 kg     Physical Exam: Blood pressure (!) 134/54, pulse 83, temperature 98.7 F (37.1 C), temperature source Oral, resp. rate 20, height 5' 5.5" (1.664 m), weight 71.1 kg, SpO2 93 %. Body mass index is 25.7 kg/m. General: Well developed, well nourished, in no acute distress. Head eyes ears nose throat: Normocephalic, atraumatic, sclera non-icteric, no xanthomas, nares are without discharge. No apparent thyromegaly and/or mass  Lungs: Normal respiratory effort. Diffuse wheezes, no rales, some rhonchi.  Heart: Irregular with normal S1 S2. no murmur gallop, no rub, PMI is normal size and placement, carotid upstroke normal without bruit, jugular venous pressure is normal Abdomen: Soft, non-tender, non-distended with normoactive bowel sounds. No hepatomegaly. No rebound/guarding. No obvious abdominal masses. Abdominal aorta is normal size without bruit Extremities: Trace edema. no cyanosis, no clubbing, no ulcers  Peripheral : 2+ bilateral upper extremity pulses, 2+ bilateral femoral pulses, 2+ bilateral dorsal pedal pulse Neuro: Alert and oriented. No facial asymmetry. No focal deficit. Moves all extremities  spontaneously. Musculoskeletal: Normal muscle tone without kyphosis Psych:  Responds to questions appropriately with a normal affect.    Assessment: 86 year old female with acute right lower lobe pneumonia with cough and congestion and inflammation with hypoxia likely causing atrial fibrillation with rapid ventricular rate without congestive heart failure myocardial infarction and/or acute coronary syndrome  Plan: 1. Continue diltiazem at 240 mg CD for heart rate control of atrial fibrillation between heart rate goal of 60 to 90 bpm 2. Will further consider with longer discussion tomorrow possible anticoagulation for further risk reduction in stroke with atrial fibrillation as long as patient willing 3. Echocardiogram for LV systolic dysfunction valvular heart disease contributing to above and further adjustments of medication management 4. Supportive care of pneumonia and treatment 5. Further treatment options after above  Signed, Corey Skains M.D. [facility] 08/28/2020, 4:42 PM

## 2020-08-28 NOTE — TOC Initial Note (Signed)
Transition of Care [facility]) - Initial/Assessment Note    Patient Details  Name: [name] MRN: [id number] Date of Birth: [birth year]  Transition [facility]) CM/SW Contact:    Kerin Salen, RN Phone Number: 08/28/2020, 1:55 PM  Clinical Narrative:  Very pleasant 86yo, alert and oriented x4, states she lives alone and still driving. Pharmacy is [facility] in [place] medications are delivered. Patient states she is able to do her own shopping and prepare her meals. Do have a Cane and Walker at home that was used by her late husband, she may use the can if having weakness. Denies using any other medical devices, nor home health services. Patient states she has a Sister and two nieces that lives close by to call for assistance if needed. Will continue to monitor for discharge needs.                 Expected Discharge Plan: Home/Self Care Barriers to Discharge: Continued Medical Work up   Patient Goals and CMS Choice Patient states their goals for this hospitalization and ongoing recovery are:: Return home   Choice offered to / list presented to : NA  Expected Discharge Plan and Services Expected Discharge Plan: Home/Self Care In-house Referral: NA Discharge Planning Services: NA Post Acute Care Choice: NA Living arrangements for the past 2 months: Single Family Home                 DME Arranged: N/A DME Agency: NA       HH Arranged: NA HH Agency: NA        Prior Living Arrangements/Services Living arrangements for the past 2 months: Single Family Home Lives with:: Self Patient language and need for interpreter reviewed:: Yes Do you feel safe going back to the place where you live?: Yes      Need for Family Participation in Patient Care: No (Comment) Care giver support system in place?: Yes (comment) (Sister and two Neices are available if needed.)   Criminal Activity/Legal Involvement Pertinent to Current Situation/Hospitalization: No - Comment as needed  Activities  of Daily Living Home Assistive Devices/Equipment: Environmental consultant (specify type) (front wheel) ADL Screening (condition at time of admission) Patient's cognitive ability adequate to safely complete daily activities?: Yes Is the patient deaf or have difficulty hearing?: No Does the patient have difficulty seeing, even when wearing glasses/contacts?: No Does the patient have difficulty concentrating, remembering, or making decisions?: No Patient able to express need for assistance with ADLs?: Yes Does the patient have difficulty dressing or bathing?: No Independently performs ADLs?: Yes (appropriate for developmental age) Does the patient have difficulty walking or climbing stairs?: No Weakness of Legs: None Weakness of Arms/Hands: None  Permission Sought/Granted Permission sought to share information with : Case Manager                Emotional Assessment Appearance:: Appears stated age Attitude/Demeanor/Rapport: Engaged, Self-Confident Affect (typically observed): Accepting, Appropriate Orientation: : Oriented to Self, Oriented to Place, Oriented to  Time, Oriented to Situation Alcohol / Substance Use: Not Applicable Psych Involvement: No (comment)  Admission diagnosis:  Hyponatremia [E87.1] Pneumonia [J18.9] Hypoxia [R09.02] AKI (acute kidney injury) ([place]) [N17.9] Atrial fibrillation with RVR ([place]) [I48.91] Community acquired pneumonia, unspecified laterality [J18.9] Patient Active Problem List   Diagnosis Date Noted  . RLL pneumonia 08/27/2020  . Acute respiratory failure with hypoxia ([place]) 08/27/2020  . Hyponatremia 08/27/2020  . Sepsis due to pneumonia ([place]) 08/27/2020  . Rapid atrial fibrillation ([place]) 08/27/2020  .  DNR (do not resuscitate)/DNI(Do Not Intubate) 08/27/2020  . SOB (shortness of breath) 06/30/2018  . GERD (gastroesophageal reflux disease) 04/29/2018  . Hypertension 03/02/2018  . Arthritis 03/02/2018  . Pain in limb 03/02/2018  . HTN (hypertension), benign  01/08/2014   PCP:  Rusty Aus, MD Pharmacy:   [place], [place] - [address] [address] Phone: [phone] Fax: [phone]     Social Determinants of Health (SDOH) Interventions    Readmission Risk Interventions No flowsheet data found.

## 2020-08-28 NOTE — Progress Notes (Signed)
PHARMACY NOTE:  ANTIMICROBIAL RENAL DOSAGE ADJUSTMENT  Current antimicrobial regimen includes a mismatch between antimicrobial dosage and estimated renal function.  As per policy approved by the Pharmacy & Therapeutics and Medical Executive Committees, the antimicrobial dosage will be adjusted accordingly.  Current antimicrobial dosage:  Cefepime 2 gram q 24 hours  Indication: Sepsis  Renal Function: has improved. Scr 1.19 > 0.95  Estimated Creatinine Clearance: 38.6 mL/min (by C-G formula based on SCr of 0.95 mg/dL).     Antimicrobial dosage has been changed to:  Cefepime 2 gram q 12 hours  Additional comments:   Thank you for allowing pharmacy to be a part of this patient's care.  Dorothe Pea, PharmD, BCPS 08/28/2020 10:32 AM

## 2020-08-28 NOTE — Evaluation (Addendum)
Clinical/Bedside Swallow Evaluation Patient Details  Name: [name] MRN: [id number] Date of Birth: 02-Mar-1927  Today's Date: 08/28/2020 Time: SLP Start Time (ACUTE ONLY): 1000 SLP Stop Time (ACUTE ONLY): 1100 SLP Time Calculation (min) (ACUTE ONLY): 60 min  Past Medical History:  Past Medical History:  Diagnosis Date  . Allergic rhinitis   . Arthritis   . Colon polyp   . Edema   . Esophageal spasm   . GERD (gastroesophageal reflux disease)   . Hypertension    Past Surgical History:  Past Surgical History:  Procedure Laterality Date  . APPENDECTOMY    . CATARACT EXTRACTION    . dilatation and curatage    . EYE SURGERY    . HYSTEROTOMY    . OOPHORECTOMY    . TONSILLECTOMY     HPI:  Per admitting H&P: "86 year old female with a history of hypertension, reflux who presents to the hospital ER from the PCP office.  Patient's had a 2-week history of cough, congestion.  Patient states that she normally gets an upper respiratory infection in November.  Patient has been on Levaquin and prednisone.  This was changed to Ceftin and prednisone.  She followed up with her PCP in the office today.  She was noted to be hypoxic with room air saturations in the mid 80s.  She was also tachycardic.  She was sent to the ER for evaluation."   Per chart review (04/29/2018): "Hiatal Hernia".   DG Esophagus/ Barium Swallow Study (04/12/2018): "1. Mild tertiary contractions of the distal third of the esophagus as can be seen with presbyesophagus versus mild spasm. 2. Mild gastroesophageal reflux. 3. Small hiatal hernia. 4. No esophageal stricture."  CXR (08/27/2020): "1. Areas of patchy and consolidative opacity in the right suprahilar lung and right lung base with associated airways thickening, concerning for pneumonia or sequela aspiration given history of (esophageal)dysphagia and esophageal spasm. 2. Chronic hyperinflation and coarsened interstitial changes. 3. Possible trace right pleural effusion or  scarring."    Assessment / Plan / Recommendation Clinical Impression  Pt was seen bedside for clinical swallow evaluation. No Overt Clinical s/s of aspiration were noted. Pt appears at reduced risk for aspiration from an oropharyngeal phase function following general aspiration precautions. Pt presents w/ h/x of: hiatal hernia, GERD & PNA. Per DG Esophagus (04/12/2018): "1. Mild tertiary contractions of the distal third of the esophagus as can be seen with presbyesophagus versus mild spasm. 2. Mild gastroesophageal reflux. 3. Small hiatal hernia. 4. No esophageal stricture." Per CXR (08/27/2020): "1. Areas of patchy and consolidative opacity in the right suprahilar lung and right lung base with associated airways thickening, concerning for pneumonia or sequela aspiration given history of (esophageal)dysphagia and esophageal spasm. 2. Chronic hyperinflation and coarsened interstitial changes. 3. Possible trace right pleural effusion or scarring." A&Ox4; verbose. Able to follow two step directions. Per pt, she feels "exhausted" today due to being kept up at night by her cough. Pt intermittently rested during evaluation, citing fatigue, and was given verbal cues to orient to task. Pt cooperative throughout.  Per case history interview, pt has not noted any s/s of Oral/Pharyngeal phase Dysphagia, or aspiration. It appears pt continues to have congested, non-productive cough baseline-- observed by clinicians Prior to POs & endorsed verbally by pt. Pt does not avoid any food/liquid consistencies baseline. Pt endorsed acid reflux as noted in chart, but says she "takes a pill [PPI] in the morning to manage it". Pt also endorsed possible oral thrush; NSG aware.  Cursory  Oral Mech Exam reveals lingual strength & ROM to be grossly WFL. Pt wears top & bottom dentures, & reports no issues w/ them for mastication purposed. Pt was given cleaning solution for dentures, as well as oral swab kit, and educated on the  importance of oral hygiene. Pt was assessed directly w/ 10x PO trials of thin liquids via cup, 10x puree via spoon, and 10x solids via spoon-- No Overt Clinical s/s of aspiration noted. Pt required mod+ assist for upright positioning in bed, but self-fed all trial consistencies w/ set up assist. Of note, pt exhibited 1x congested non-productive cough post initial PO trial of thin liquid via cup but None further w/ following trials taken. No O2 desat noted, increased WOB, or SOB at time of cough. Per pt, she did not feel that she was "choking" on water, and verbally endorsed the noted cough was consistent w/ her baseline cough s/p PNA and breathing. No Oral Phase s/s of aspiration noted: Mastication WFL, no oral holding, complete clearance of oral residue, timely A-P transfer of bolus, adequate oral prep stage. No Overt Immediate Pharyngeal Phase s/s noted-- following 1x congested non-productive cough w/ initial PO of thin liquid, no coughing following PO's noted. No O2 desat noted, RHR stable throughout, no increased WOB/SOB noted. Pt voice was clear between & following all PO trials.   Recommend Age-appropriate Regular Diet w/ thin liquids VIA cup; meds whole in applesauce for safer swallowing. Pt appears at reduced risk for aspiration following general aspiration precautions. Of note any Esophageal dysmotility and Reflux can increase risk for Regurgitation of Reflux material thus risk for aspiration of the Reflux material and Pulmonary impact. Recommend reflux and general precautions & esophageal precautions-- education provided, handouts left w/ pt. MD/NSG updated. Pt left in room on phone w/ relative. ST services to sign off at this time-- NSG to reconsult for further needs. SLP Visit Diagnosis: Dysphagia, unspecified (R13.10)    Aspiration Risk  Mild aspiration risk (reduced following asp. precautions)    Diet Recommendation  Age-appropriate Regular Diet w/ thin liquids VIA cup; Reflux  precautions  Medication Administration: Whole meds with puree    Other  Recommendations Recommended Consults: Consider esophageal assessment Oral Care Recommendations: Oral care BID   Follow up Recommendations n/a      Frequency and Duration  n/a       Prognosis Prognosis for Safe Diet Advancement: Good Barriers to Reach Goals: Time post onset;Severity of deficits      Swallow Study   General Date of Onset: 08/27/20 HPI: Per admitting H&P: "86 year old female with a history of hypertension, reflux who presents to the hospital ER from the PCP office.  Patient's had a 2-week history of cough, congestion.  Patient states that she normally gets an upper respiratory infection in November.  Patient has been on Levaquin and prednisone.  This was changed to Ceftin and prednisone.  She followed up with her PCP in the office today.  She was noted to be hypoxic with room air saturations in the mid 80s.  She was also tachycardic.  She was sent to the ER for evaluation." Type of Study: Bedside Swallow Evaluation Previous Swallow Assessment: 04/12/2018 (DG esophagus) : 1. Mild tertiary contractions of the distal third of the esophagus Diet Prior to this Study: Regular;Thin liquids ("soft") Temperature Spikes Noted: No Respiratory Status: Room air History of Recent Intubation: No Behavior/Cognition: Alert;Cooperative Oral Cavity Assessment: Within Functional Limits Oral Care Completed by SLP: No (left in room ) Oral Cavity -  Dentition: Dentures, top;Dentures, bottom Vision: Functional for self-feeding Self-Feeding Abilities: Able to feed self;Needs set up Patient Positioning: Upright in bed Baseline Vocal Quality: Normal    Oral/Motor/Sensory Function Overall Oral Motor/Sensory Function: Within functional limits   Ice Chips Ice chips: Not tested   Thin Liquid Thin Liquid: Within functional limits Presentation: Cup Other Comments: 10x    Nectar Thick Nectar Thick Liquid: Not tested   Honey  Thick Honey Thick Liquid: Not tested   Puree Puree: Within functional limits Presentation: Self Fed;Spoon Other Comments: 10x   Solid     Solid: Within functional limits Presentation: Self Fed;Spoon Other Comments: 10x      Quintella Baton  Graduate Clinician 08/28/2020,12:01 PM  The information in this patient note, response to treatment, and overall treatment plan developed has been reviewed and agreed upon after reviewing documentation. This session was performed under the supervision of this licensed clinician.   Orinda Kenner, MS, [facility] [phone]

## 2020-08-28 NOTE — Progress Notes (Signed)
[facility] [place] NAME: [name]    MR#:  [id number]  DATE OF BIRTH:  [birth year]  SUBJECTIVE:   Came in with increasing shortness of breath and cough. She feels little better. Tired from not getting adequate sleep. Continues to cough REVIEW OF SYSTEMS:   Review of Systems  Constitutional: Positive for malaise/fatigue. Negative for chills, fever and weight loss.  HENT: Negative for ear discharge, ear pain and nosebleeds.   Eyes: Negative for blurred vision, pain and discharge.  Respiratory: Positive for cough, sputum production and shortness of breath. Negative for wheezing and stridor.   Cardiovascular: Negative for chest pain, palpitations, orthopnea and PND.  Gastrointestinal: Negative for abdominal pain, diarrhea, nausea and vomiting.  Genitourinary: Negative for frequency and urgency.  Musculoskeletal: Negative for back pain and joint pain.  Neurological: Positive for weakness. Negative for sensory change, speech change and focal weakness.  Psychiatric/Behavioral: Negative for depression and hallucinations. The patient is not nervous/anxious.    Tolerating Diet: yes Tolerating PT: pending  DRUG ALLERGIES:   Allergies  Allergen Reactions  . Amoxicillin-Pot Clavulanate Nausea Only  . Cefprozil Nausea Only  . Clarithromycin Nausea Only  . Prednisone     Other reaction(s): Other (See Comments) Hyperglycemia  . Pseudoephedrine     Other reaction(s): Unknown  . Penicillins Rash  . Sulfa Antibiotics Rash    VITALS:  Blood pressure (!) 134/54, pulse 83, temperature 98.7 F (37.1 C), temperature source Oral, resp. rate 20, height 5' 5.5" (1.664 m), weight 71.1 kg, SpO2 93 %.  PHYSICAL EXAMINATION:   Physical Exam  GENERAL:  85 y.o.-year-old patient lying in the bed with no acute distress.  HEENT: Head atraumatic, normocephalic. Oropharynx and nasopharynx clear.  LUNGS: coarse breath sounds bilaterally, no wheezing, rales,  rhonchi. No use of accessory muscles of respiration.  CARDIOVASCULAR: S1, S2 normal. No murmurs, rubs, or gallops.mild tachy  ABDOMEN: Soft, nontender, nondistended. Bowel sounds present. No organomegaly or mass.  EXTREMITIES: No cyanosis, clubbing or edema b/l.    NEUROLOGIC: Cranial nerves II through XII are intact. No focal Motor or sensory deficits b/l.  weak PSYCHIATRIC:  patient is alert and oriented x 3.  SKIN: No obvious rash, lesion, or ulcer.   LABORATORY PANEL:  CBC Recent Labs  Lab 08/28/20 0503  WBC 18.6*  HGB 12.4  HCT 37.1  PLT 315    Chemistries  Recent Labs  Lab 08/27/20 1507 08/27/20 2036 08/28/20 0503  NA  --   --  133*  K  --   --  3.3*  CL  --   --  102  CO2  --   --  21*  GLUCOSE  --   --  111*  BUN  --   --  20  CREATININE  --   --  0.95  CALCIUM  --   --  8.7*  MG 1.7  --   --   AST  --    < > 19  ALT  --    < > 16  ALKPHOS  --    < > 53  BILITOT  --    < > 0.8   < > = values in this interval not displayed.   Cardiac Enzymes No results for input(s): TROPONINI in the last 168 hours. RADIOLOGY:  CT Angio Chest PE W and/or Wo Contrast  Result Date: 08/27/2020 CLINICAL DATA:  86 year old female with concern for pulmonary embolism. EXAM: CT ANGIOGRAPHY CHEST  WITH CONTRAST TECHNIQUE: Multidetector CT imaging of the chest was performed using the standard protocol during bolus administration of intravenous contrast. Multiplanar CT image reconstructions and MIPs were obtained to evaluate the vascular anatomy. CONTRAST:  21mL OMNIPAQUE IOHEXOL 350 MG/ML SOLN COMPARISON:  Chest CT dated 03/05/2009 and radiograph dated 08/27/2020. FINDINGS: Cardiovascular: There is no cardiomegaly or pericardial effusion. There is coronary vascular calcification. Mild atherosclerotic calcification of the thoracic aorta. No aneurysmal dilatation. No pulmonary artery embolus identified. Mediastinum/Nodes: Right hilar adenopathy measures 15 mm. Subcarinal lymph node measures 15  mm short axis. There is a small hiatal hernia. The esophagus is grossly unremarkable. No mediastinal fluid collection. Lungs/Pleura: Mild bilateral upper lobe bronchiectasis. There are clusters of nodular and streaky densities in the right upper lobe with areas of scarring. Findings consistent with acute pneumonia superimposed on background of chronic changes. Focal area of scarring noted in the lingula. There is an area of consolidative change at the right lung base. There is diffuse nodular densities throughout the right lung consistent with infection, possibly atypical in etiology or aspiration. Clinical correlation is recommended. There is peribronchial thickening primarily in the right lower lobe. No pleural effusion pneumothorax. The central airways are patent. Upper Abdomen: None Musculoskeletal: Degenerative changes of the spine. No acute osseous pathology. Review of the MIP images confirms the above findings. IMPRESSION: 1. No CT evidence of pulmonary embolism. 2. Multifocal pneumonia predominantly involving the right lower lobe, possibly atypical in etiology or aspiration. Clinical correlation and follow-up to resolution recommended. 3. Aortic Atherosclerosis (ICD10-I70.0). Electronically Signed   By: Anner Crete M.D.   On: 08/27/2020 16:30   DG Chest Portable 1 View  Result Date: 08/27/2020 CLINICAL DATA:  Shortness of breath, tachycardia EXAM: PORTABLE CHEST 1 VIEW COMPARISON:  Radiograph 06/30/2018, CT 03/05/2009 FINDINGS: Chronic hyperinflation and coarsened interstitial changes are again seen albeit with new areas of more patchy and consolidative opacity in the right suprahilar lung and right lung base with associated airways thickening. Left lung remains relatively clear. No pneumothorax. Blunting of the right costophrenic sulcus could reflect scarring or trace effusion. Degenerative changes are present in the imaged spine and shoulders. Telemetry leads overlie the chest. IMPRESSION: 1.  Areas of patchy and consolidative opacity in the right suprahilar lung and right lung base with associated airways thickening, concerning for pneumonia or sequela aspiration given history of dysphagia and esophageal spasm. 2. Chronic hyperinflation and coarsened interstitial changes. 3. Possible trace right pleural effusion or scarring. Electronically Signed   By: Lovena Le M.D.   On: 08/27/2020 15:28   ASSESSMENT AND PLAN:   86 year old female with a history of hypertension, reflux who presents to the hospital ER from the PCP office.  Patient's had a 2-week history of cough, congestion.  Patient states that she normally gets an upper respiratory infection in November.  Patient has been on Levaquin and prednisone.  This was changed to Ceftin and prednisone.  She followed up with her PCP in the office today.  She was noted to be hypoxic with room air saturations in the mid 80s.  She was also tachycardic.     Sepsis due to pneumonia [facility] [place]) came in with tachypnea elevated white count 25 k and   chest x-ray shows pneumonia   She has evidence of acute organ dysfunction -- treatment as below  RLL pneumonia-- failed outpatient treatment -- CT chest no PE. Multifocal pneumonia predominantly involving the right lower lobe, possibly atypical in etiology or aspiration -- Patient came in with increasing  cough congestion was found to be hypoxic at PCPs office. -- IV cefepime. D/c Vanc since MRSA PCR negative --Covid test is negative.   -- follow-up Legionella and strep pneumo antigens.  -- Blood cx were not ordered by ER until after pt's IV ABX were already given. -- Speech therapy input appreciated. Follow dietary recommendation  Acute respiratory failure with hypoxia (HCC) Continue supplemental oxygen. -- Wean as tolerated. -- Incentive spirometer  Rapid atrial fibrillation ([place])-- new onset in the setting of sepsis Continue IV Cardizem-- switch to oral cartia XT since HR stable -- Check  echo. --  Start baby aspirin. -- Cardiology consultation with Dr. Nehemiah Massed-- aware  Acute renal failure--resolved -came in with creat 1.19--now 0.9 --received IVF -resume Dyazide at discharge if bp stable  GERD (gastroesophageal reflux disease) Stable.  Continue PPI.  HTN (hypertension), benign hold triamterene/hydrochlorothiazide and Lasix on diltiazem drip  DNR (do not resuscitate)/DNI(Do Not Intubate) Verified with the patient that she is a DNR and DNI.  She lives at home by herself.   Generalized weakness deconditioning -- physical therapy to see patient              DVT prophylaxis: Lovenox Code Status: DNR/DNI(Do NOT Intubate) Family Communication: no family at bedside  Disposition Plan: DC to home +/- home health  Consults called:  cardiology Dr. Nehemiah Massed Admission status: Inpatient, Telemetry bed  DVT Prophylaxis :  Status is: Inpatient  Remains inpatient appropriate because:IV treatments appropriate due to intensity of illness or inability to take PO and Inpatient level of care appropriate due to severity of illness                Patient currently is not medically stable to d/c.        TOTAL TIME TAKING CARE OF THIS PATIENT: 30 minutes.  >50% time spent on counselling and coordination of care  Note: This dictation was prepared with Dragon dictation along with smaller phrase technology. Any transcriptional errors that result from this process are unintentional.  Fritzi Mandes M.D    [facility]   CC: Primary care physician; Rusty Aus, MDPatient ID: [name], female   DOB: [birth year], 86 y.o.   MRN: [id number]

## 2020-08-29 DIAGNOSIS — J189 Pneumonia, unspecified organism: Secondary | ICD-10-CM | POA: Diagnosis not present

## 2020-08-29 DIAGNOSIS — K219 Gastro-esophageal reflux disease without esophagitis: Secondary | ICD-10-CM | POA: Diagnosis not present

## 2020-08-29 DIAGNOSIS — I4891 Unspecified atrial fibrillation: Secondary | ICD-10-CM | POA: Diagnosis not present

## 2020-08-29 DIAGNOSIS — J9601 Acute respiratory failure with hypoxia: Secondary | ICD-10-CM | POA: Diagnosis not present

## 2020-08-29 LAB — URINALYSIS, COMPLETE (UACMP) WITH MICROSCOPIC
Bacteria, UA: NONE SEEN
Bilirubin Urine: NEGATIVE
Glucose, UA: NEGATIVE mg/dL
Ketones, ur: NEGATIVE mg/dL
Leukocytes,Ua: NEGATIVE
Nitrite: NEGATIVE
Protein, ur: NEGATIVE mg/dL
Specific Gravity, Urine: 1.014 (ref 1.005–1.030)
Squamous Epithelial / HPF: NONE SEEN (ref 0–5)
pH: 6 (ref 5.0–8.0)

## 2020-08-29 LAB — OSMOLALITY, URINE: Osmolality, Ur: 464 mOsm/kg (ref 300–900)

## 2020-08-29 LAB — SODIUM, URINE, RANDOM: Sodium, Ur: 79 mmol/L

## 2020-08-29 LAB — ECHOCARDIOGRAM COMPLETE
Height: 65.5 in
S' Lateral: 2.44 cm
Weight: 2508.8 oz

## 2020-08-29 LAB — STREP PNEUMONIAE URINARY ANTIGEN: Strep Pneumo Urinary Antigen: NEGATIVE

## 2020-08-29 MED ORDER — TRIAMTERENE-HCTZ 37.5-25 MG PO CAPS: 1.0000 | ORAL_CAPSULE | Freq: Every day | ORAL | Status: DC

## 2020-08-29 MED ORDER — APIXABAN 5 MG PO TABS
5.0000 mg | ORAL_TABLET | Freq: Two times a day (BID) | ORAL | Status: DC
  Administered 2020-08-29 – 2020-09-03 (×11): 5 mg via ORAL
  Filled 2020-08-29 (×11): qty 1

## 2020-08-29 MED ORDER — METOPROLOL TARTRATE 25 MG PO TABS
25.0000 mg | ORAL_TABLET | Freq: Two times a day (BID) | ORAL | Status: DC
  Administered 2020-08-29 – 2020-09-03 (×11): 25 mg via ORAL
  Filled 2020-08-29 (×11): qty 1

## 2020-08-29 MED ORDER — DOXYCYCLINE HYCLATE 100 MG PO TABS
100.0000 mg | ORAL_TABLET | Freq: Two times a day (BID) | ORAL | Status: AC
  Administered 2020-08-29 – 2020-09-02 (×9): 100 mg via ORAL
  Filled 2020-08-29 (×9): qty 1

## 2020-08-29 NOTE — Progress Notes (Signed)
[facility] Encounter Note  Patient: [name] / Admit Date: 08/27/2020 / Date of Encounter: 08/29/2020, 7:05 AM   Subjective: 12/1 patient overall feeling much better since admission.  No evidence of significant cough or congestion overnight.  Patient has not had any significant hypotension or rapid heart rate his current medical regimen for atrial fibrillation heart rate control.  Troponin still minimal most consistent with demand ischemia rather than acute coronary syndrome.  No heart failure symptoms.  Talk with the patient with long discussion about anticoagulation and the patient currently has no evidence of significant bleeding concerns and will be appropriate for anticoagulation for atrial fibrillation.  Heart rate control at rest reasonable but likely will need additional medication management  Review of Systems: Positive for: Shortness of breath Negative for: Vision change, hearing change, syncope, dizziness, nausea, vomiting,diarrhea, bloody stool, stomach pain, cough, congestion, diaphoresis, urinary frequency, urinary pain,skin lesions, skin rashes Others previously listed  Objective: Telemetry: Atrial fibrillation with controlled ventricular rate Physical Exam: Blood pressure (!) 116/58, pulse 89, temperature 98.6 F (37 C), temperature source Oral, resp. rate 18, height 5' 5.5" (1.664 m), weight 70.9 kg, SpO2 94 %. Body mass index is 25.6 kg/m. General: Well developed, well nourished, in no acute distress. Head: Normocephalic, atraumatic, sclera non-icteric, no xanthomas, nares are without discharge. Neck: No apparent masses Lungs: Normal respirations with some wheezes, diffuse rhonchi, no rales , no crackles   Heart: Irregular rate and rhythm, normal S1 S2, no murmur, no rub, no gallop, PMI is normal size and placement, carotid upstroke normal without bruit, jugular venous pressure normal Abdomen: Soft, non-tender, non-distended with normoactive bowel  sounds. No hepatosplenomegaly. Abdominal aorta is normal size without bruit Extremities: No edema, no clubbing, no cyanosis, no ulcers,  Peripheral: 2+ radial, 2+ femoral, 2+ dorsal pedal pulses Neuro: Alert and oriented. Moves all extremities spontaneously. Psych:  Responds to questions appropriately with a normal affect.   Intake/Output Summary (Last 24 hours) at 08/29/2020 0705 Last data filed at 08/29/2020 0504 Gross per 24 hour  Intake 2674.37 ml  Output 1950 ml  Net 724.37 ml    Inpatient Medications:   apixaban  5 mg Oral BID   calcium-vitamin D  2 tablet Oral Daily   diltiazem  240 mg Oral Daily   escitalopram  5 mg Oral Daily   metoprolol tartrate  25 mg Oral BID   nystatin  5 mL Mouth/Throat QID   pantoprazole  40 mg Oral Daily   sodium chloride flush  3 mL Intravenous Q12H   Infusions:   sodium chloride Stopped (08/28/20 2231)   ceFEPime (MAXIPIME) IV Stopped (08/28/20 2134)    Labs: Recent Labs    08/27/20 1501 08/27/20 1507 08/28/20 0503  NA 127*  --  133*  K 4.3  --  3.3*  CL 90*  --  102  CO2 24  --  21*  GLUCOSE 131*  --  111*  BUN 28*  --  20  CREATININE 1.19*  --  0.95  CALCIUM 9.9  --  8.7*  MG  --  1.7  --    Recent Labs    08/27/20 2036 08/28/20 0503  AST 21 19  ALT 16 16  ALKPHOS 50 53  BILITOT 0.6 0.8  PROT 5.7* 5.4*  ALBUMIN 2.7* 2.6*   Recent Labs    08/27/20 1510 08/28/20 0503  WBC 25.2* 18.6*  NEUTROABS 22.4* 14.3*  HGB 15.5* 12.4  HCT 46.0 37.1  MCV 89.1 89.2  PLT 443* 315   No results for input(s): CKTOTAL, CKMB, TROPONINI in the last 72 hours. Invalid input(s): POCBNP No results for input(s): HGBA1C in the last 72 hours.   Weights: Filed Weights   08/27/20 1454 08/28/20 0431 08/29/20 0456  Weight: 65.5 kg 71.1 kg 70.9 kg     Radiology/Studies:  CT Angio Chest PE W and/or Wo Contrast  Result Date: 08/27/2020 CLINICAL DATA:  86 year old female with concern for pulmonary embolism. EXAM: CT  ANGIOGRAPHY CHEST WITH CONTRAST TECHNIQUE: Multidetector CT imaging of the chest was performed using the standard protocol during bolus administration of intravenous contrast. Multiplanar CT image reconstructions and MIPs were obtained to evaluate the vascular anatomy. CONTRAST:  77mL OMNIPAQUE IOHEXOL 350 MG/ML SOLN COMPARISON:  Chest CT dated 03/05/2009 and radiograph dated 08/27/2020. FINDINGS: Cardiovascular: There is no cardiomegaly or pericardial effusion. There is coronary vascular calcification. Mild atherosclerotic calcification of the thoracic aorta. No aneurysmal dilatation. No pulmonary artery embolus identified. Mediastinum/Nodes: Right hilar adenopathy measures 15 mm. Subcarinal lymph node measures 15 mm short axis. There is a small hiatal hernia. The esophagus is grossly unremarkable. No mediastinal fluid collection. Lungs/Pleura: Mild bilateral upper lobe bronchiectasis. There are clusters of nodular and streaky densities in the right upper lobe with areas of scarring. Findings consistent with acute pneumonia superimposed on background of chronic changes. Focal area of scarring noted in the lingula. There is an area of consolidative change at the right lung base. There is diffuse nodular densities throughout the right lung consistent with infection, possibly atypical in etiology or aspiration. Clinical correlation is recommended. There is peribronchial thickening primarily in the right lower lobe. No pleural effusion pneumothorax. The central airways are patent. Upper Abdomen: None Musculoskeletal: Degenerative changes of the spine. No acute osseous pathology. Review of the MIP images confirms the above findings. IMPRESSION: 1. No CT evidence of pulmonary embolism. 2. Multifocal pneumonia predominantly involving the right lower lobe, possibly atypical in etiology or aspiration. Clinical correlation and follow-up to resolution recommended. 3. Aortic Atherosclerosis (ICD10-I70.0). Electronically Signed    By: Anner Crete M.D.   On: 08/27/2020 16:30   DG Chest Portable 1 View  Result Date: 08/27/2020 CLINICAL DATA:  Shortness of breath, tachycardia EXAM: PORTABLE CHEST 1 VIEW COMPARISON:  Radiograph 06/30/2018, CT 03/05/2009 FINDINGS: Chronic hyperinflation and coarsened interstitial changes are again seen albeit with new areas of more patchy and consolidative opacity in the right suprahilar lung and right lung base with associated airways thickening. Left lung remains relatively clear. No pneumothorax. Blunting of the right costophrenic sulcus could reflect scarring or trace effusion. Degenerative changes are present in the imaged spine and shoulders. Telemetry leads overlie the chest. IMPRESSION: 1. Areas of patchy and consolidative opacity in the right suprahilar lung and right lung base with associated airways thickening, concerning for pneumonia or sequela aspiration given history of dysphagia and esophageal spasm. 2. Chronic hyperinflation and coarsened interstitial changes. 3. Possible trace right pleural effusion or scarring. Electronically Signed   By: Lovena Le M.D.   On: 08/27/2020 15:28     Assessment and Recommendation  86 y.o. female with borderline hypertension having right lung pneumonia with new onset atrial fibrillation with rapid ventricular rate now with better heart rate control on diltiazem without evidence of congestive heart failure acute coronary syndrome and/or myocardial infarction 1.  Continue heart rate control with oral diltiazem at 240 mg CD each day and will add 25 mg of metoprolol for a goal heart rate with ambulation between 60 and 90  bpm 2.  Begin anticoagulation with Eliquis 5 mg twice per day for further risk reduction in stroke with atrial fibrillation and will discontinue enoxaparin and aspirin.  Will follow for any bleeding or bruising complications 3.  No further cardiac diagnostics necessary at this time due to no evidence of angina or congestive heart  failure 4.  Continue supportive care of pneumonia hypoxia and with pulmonary toilet  Signed, Serafina Royals M.D. FACC

## 2020-08-29 NOTE — Evaluation (Signed)
Physical Therapy Evaluation Patient Details Name: [name] MRN: [id number] DOB: 05/02/1927 Today's Date: 08/29/2020   History of Present Illness  Per MD notes: Pt is a 86 year old female with a history of hypertension and reflux who presents to the hospital with a 2-week history of cough, congestion.  MD assessment includes: Sepsis due to pneumonia, RLL PNA, acute respiratory failure, rapid A-fib, acute renal failure, GERD, and generalized weakness.    Clinical Impression  Pt was pleasant and motivated to participate during the session.  Pt found on room air with SpO2 mostly in the low 90s but dropped to low 80s with coughing/speaking.  Per nursing ok to put pt back on 1-2LO2/min as needed.  Pt placed on 1LO2/min with SpO2 remaining in the high 80s to mid 90s throughout the session and left on 1LO2/min per nsg request at end of session.  Pt reported feeling weaker than baseline but did not require physical assistance with any functional task and was steady with transfers and gait.  Pt's HR remained mostly within cardiology guidelines of 60-90 bpm with ambulation with momentary high rate of 104 bpm observed.  Pt reported no adverse symptoms during the session other than general fatigue.  Pt will benefit from HHPT services upon discharge to safely address deficits listed in patient problem list for decreased caregiver assistance and eventual return to PLOF.      Follow Up Recommendations Home health PT;Supervision for mobility/OOB    Equipment Recommendations  None recommended by PT    Recommendations for Other Services       Precautions / Restrictions Precautions Precautions: Fall Restrictions Weight Bearing Restrictions: No Other Position/Activity Restrictions: Per cardiology note: HR goal with ambulation 60-90 bpm      Mobility  Bed Mobility               General bed mobility comments: NT, pt in recliner    Transfers Overall transfer level: Needs assistance Equipment  used: Rolling walker (2 wheeled) Transfers: Sit to/from Stand Sit to Stand: Min guard         General transfer comment: Min verbal cues for hand placement with fair to good eccentric and concentric control  Ambulation/Gait Ambulation/Gait assistance: Min guard Gait Distance (Feet): 30 Feet Assistive device: Rolling walker (2 wheeled) Gait Pattern/deviations: Step-through pattern;Decreased step length - right;Decreased step length - left Gait velocity: decreased   General Gait Details: Slow cadence but steady without LOB  Stairs            Wheelchair Mobility    Modified Rankin (Stroke Patients Only)       Balance Overall balance assessment: Needs assistance   Sitting balance-Leahy Scale: Normal     Standing balance support: Bilateral upper extremity supported;During functional activity Standing balance-Leahy Scale: Good                               Pertinent Vitals/Pain Pain Assessment: No/denies pain    Home Living Family/patient expects to be discharged to:: Private residence Living Arrangements: Alone Available Help at Discharge: Family;Available 24 hours/day Type of Home: House Home Access: Stairs to enter Entrance Stairs-Rails: None Entrance Stairs-Number of Steps: 1 small step to enter Home Layout: One level Home Equipment: [place] - 2 wheels;Shower seat Pt has two nieces and a sister with one of her nieces able to provide 24/hr supervision as needed    Prior Function Level of Independence: Independent  Comments: Ind amb community distances without an AD, no fall history, Ind with ADLs, drives     Hand Dominance        Extremity/Trunk Assessment   Upper Extremity Assessment Upper Extremity Assessment: Generalized weakness    Lower Extremity Assessment Lower Extremity Assessment: Generalized weakness       Communication   Communication: No difficulties  Cognition Arousal/Alertness: Awake/alert Behavior During  Therapy: WFL for tasks assessed/performed Overall Cognitive Status: Within Functional Limits for tasks assessed                                        General Comments      Exercises Total Joint Exercises Ankle Circles/Pumps: Strengthening;Both;10 reps Quad Sets: Strengthening;Both;10 reps Gluteal Sets: Strengthening;Both;10 reps Hip ABduction/ADduction: Strengthening;Both;10 reps Straight Leg Raises: Strengthening;Both;10 reps Long Arc Quad: Strengthening;Both;10 reps Marching in Standing: Strengthening;Both;10 reps;Standing Other Exercises Other Exercises: HEP education for BLE APs, QS, and GS x 10 each every 1-2 hours daily   Assessment/Plan    PT Assessment Patient needs continued PT services  PT Problem List Decreased strength;Decreased activity tolerance;Decreased balance;Decreased mobility       PT Treatment Interventions DME instruction;Gait training;Stair training;Therapeutic activities;Functional mobility training;Therapeutic exercise;Balance training;Patient/family education    PT Goals (Current goals can be found in the Care Plan section)  Acute Rehab PT Goals Patient Stated Goal: To get stronger PT Goal Formulation: With patient Time For Goal Achievement: 09/11/20 Potential to Achieve Goals: Good    Frequency Min 2X/week   Barriers to discharge        Co-evaluation               AM-PAC PT "6 Clicks" Mobility  Outcome Measure Help needed turning from your back to your side while in a flat bed without using bedrails?: A Little Help needed moving from lying on your back to sitting on the side of a flat bed without using bedrails?: A Little Help needed moving to and from a bed to a chair (including a wheelchair)?: A Little Help needed standing up from a chair using your arms (e.g., wheelchair or bedside chair)?: A Little Help needed to walk in hospital room?: A Little Help needed climbing 3-5 steps with a railing? : A Little 6 Click  Score: 18    End of Session Equipment Utilized During Treatment: Gait belt;Oxygen Activity Tolerance: Patient tolerated treatment well Patient left: in chair;with call bell/phone within reach;with chair alarm set Nurse Communication: Mobility status;Other (comment) (SpO2 dropped to low 80s on room air, high 80s to 90s on 1LO2/min) PT Visit Diagnosis: Muscle weakness (generalized) (M62.81);Difficulty in walking, not elsewhere classified (R26.2)    Time: 1429-1500 PT Time Calculation (min) (ACUTE ONLY): 31 min   Charges:   PT Evaluation $PT Eval Moderate Complexity: 1 Mod PT Treatments $Therapeutic Exercise: 8-22 mins        D. Royetta Asal PT, DPT 08/29/20, 3:39 PM

## 2020-08-29 NOTE — Progress Notes (Addendum)
PROGRESS NOTE    [name]  ZOX:[id number] DOB: [birth year] DOA: 08/27/2020 PCP: Rusty Aus, MD   Brief Narrative: Taken from prior notes. 86 year old female with a history of hypertension, reflux who presents to the hospital ER from the PCP office.  Patient's had a 2-week history of cough, congestion.  Patient states that she normally gets an upper respiratory infection in November.  Patient has been on Levaquin and prednisone.  This was changed to Ceftin and prednisone.  She followed up with her PCP in the office today.  She was noted to be hypoxic with room air saturations in the mid 80s.  She was also tachycardic.  She was sent to the ER for evaluation. Also found to have new onset A. fib with RVR.  CTA negative for PE, she had 8/10 right lower lobe infiltrate.  Admitted for sepsis secondary to pneumonia with failed outpatient therapy.  Subjective: Patient has no new complaint today.  She lives alone and wants to go back home.  Stating that cough is improving but having bouts of cough with deep breathing.  Assessment & Plan:   Principal Problem:   RLL pneumonia Active Problems:   GERD (gastroesophageal reflux disease)   HTN (hypertension), benign   Acute respiratory failure with hypoxia (HCC)   Hyponatremia   Sepsis due to pneumonia (HCC)   Rapid atrial fibrillation (HCC)   DNR (do not resuscitate)/DNI(Do Not Intubate)   Hypoxia  Sepsis secondary to right lower lobe pneumonia.  Failed outpatient treatment.  CTA with no PE but did shows multifocal pneumonia predominantly involving right lower lobe.  Continues to have cough which increased with deep breathing.  Initially received cefepime and vancomycin, vancomycin was discontinued as MRSA PCR was negative.  Strep pneumo antigen negative.  Procalcitonin negative -Continue with cefepime. -Start her on doxycycline to cover atypical. -Continue with supportive care.  A. fib with RVR.  Patient was on IV Cardizem.  Cardiology was  consulted-appreciate their recommendations. -Continue Cardizem. -Metoprolol was added by cardiology. -Continue with Eliquis  AKI.  Resolved  GERD. -Continue with PPI.  Hypertension.  Blood pressure within goal -Continue holding home meds. -Continue with Cardizem.  Generalized weakness.  PT recommending home health services.  Objective: Vitals:   08/29/20 0400 08/29/20 0456 08/29/20 0825 08/29/20 1135  BP: (!) 116/58  (!) 161/76 128/69  Pulse: 89  99 98  Resp: 18  (!) 23 (!) 24  Temp: 98.6 F (37 C)  97.9 F (36.6 C) 98.4 F (36.9 C)  TempSrc: Oral  Oral Oral  SpO2: 94%  97% 94%  Weight:  70.9 kg    Height:        Intake/Output Summary (Last 24 hours) at 08/29/2020 1732 Last data filed at 08/29/2020 1100 Gross per 24 hour  Intake 2434.37 ml  Output 950 ml  Net 1484.37 ml   Filed Weights   08/27/20 1454 08/28/20 0431 08/29/20 0456  Weight: 65.5 kg 71.1 kg 70.9 kg    Examination:  General exam: Well-developed elderly lady, appears calm and comfortable  Respiratory system: Right basal rhonchi, respiratory effort normal. Cardiovascular system: Irregularly irregular with tachycardia Gastrointestinal system: Soft, nontender, nondistended, bowel sounds positive. Central nervous system: Alert and oriented. No focal neurological deficits. Extremities: Trace LE edema, no cyanosis, pulses intact and symmetrical. Psychiatry: Judgement and insight appear normal.     DVT prophylaxis: Lovenox Code Status: DNR Family Communication: Discussed with patient Disposition Plan:  Status is: Inpatient  Remains inpatient appropriate because:Inpatient level of care  appropriate due to severity of illness   Dispo: The patient is from: Home              Anticipated d/c is to: Home              Anticipated d/c date is: 1 day              Patient currently is not medically stable to d/c.   Consultants:   Cardiology  Procedures:  Antimicrobials:  Cefepime Doxycycline  Data  Reviewed: I have personally reviewed following labs and imaging studies  CBC: Recent Labs  Lab 08/27/20 1501 08/27/20 1510 08/28/20 0503  WBC 25.7* 25.2* 18.6*  NEUTROABS  --  22.4* 14.3*  HGB 15.8* 15.5* 12.4  HCT 46.9* 46.0 37.1  MCV 88.0 89.1 89.2  PLT 442* 443* 998   Basic Metabolic Panel: Recent Labs  Lab 08/27/20 1501 08/27/20 1507 08/28/20 0503  NA 127*  --  133*  K 4.3  --  3.3*  CL 90*  --  102  CO2 24  --  21*  GLUCOSE 131*  --  111*  BUN 28*  --  20  CREATININE 1.19*  --  0.95  CALCIUM 9.9  --  8.7*  MG  --  1.7  --    GFR: Estimated Creatinine Clearance: 38.5 mL/min (by C-G formula based on SCr of 0.95 mg/dL). Liver Function Tests: Recent Labs  Lab 08/27/20 2036 08/28/20 0503  AST 21 19  ALT 16 16  ALKPHOS 50 53  BILITOT 0.6 0.8  PROT 5.7* 5.4*  ALBUMIN 2.7* 2.6*   No results for input(s): LIPASE, AMYLASE in the last 168 hours. No results for input(s): AMMONIA in the last 168 hours. Coagulation Profile: Recent Labs  Lab 08/27/20 2036  INR 1.2   Cardiac Enzymes: No results for input(s): CKTOTAL, CKMB, CKMBINDEX, TROPONINI in the last 168 hours. BNP (last 3 results) No results for input(s): PROBNP in the last 8760 hours. HbA1C: No results for input(s): HGBA1C in the last 72 hours. CBG: No results for input(s): GLUCAP in the last 168 hours. Lipid Profile: No results for input(s): CHOL, HDL, LDLCALC, TRIG, CHOLHDL, LDLDIRECT in the last 72 hours. Thyroid Function Tests: Recent Labs    08/27/20 1510  TSH 0.818   Anemia Panel: No results for input(s): VITAMINB12, FOLATE, FERRITIN, TIBC, IRON, RETICCTPCT in the last 72 hours. Sepsis Labs: Recent Labs  Lab 08/27/20 1708 08/27/20 2036 08/27/20 2238  PROCALCITON <0.10  --   --   LATICACIDVEN  --  1.5 1.8    Recent Results (from the past 240 hour(s))  Resp Panel by RT-PCR (Flu A&B, Covid) Nasopharyngeal Swab     Status: None   Collection Time: 08/27/20  3:02 PM   Specimen:  Nasopharyngeal Swab; Nasopharyngeal(NP) swabs in vial transport medium  Result Value Ref Range Status   SARS Coronavirus 2 by RT PCR NEGATIVE NEGATIVE Final    Comment: (NOTE) SARS-CoV-2 target nucleic acids are NOT DETECTED.  The SARS-CoV-2 RNA is generally detectable in upper respiratory specimens during the acute phase of infection. The lowest concentration of SARS-CoV-2 viral copies this assay can detect is 138 copies/mL. A negative result does not preclude SARS-Cov-2 infection and should not be used as the sole basis for treatment or other patient management decisions. A negative result may occur with  improper specimen collection/handling, submission of specimen other than nasopharyngeal swab, presence of viral mutation(s) within the areas targeted by this assay, and inadequate number of  viral copies(<138 copies/mL). A negative result must be combined with clinical observations, patient history, and epidemiological information. The expected result is Negative.  Fact Sheet for Patients:  EntrepreneurPulse.com.au  Fact Sheet for Healthcare Providers:  IncredibleEmployment.be  This test is no t yet approved or cleared by the [place] FDA and  has been authorized for detection and/or diagnosis of SARS-CoV-2 by FDA under an Emergency Use Authorization (EUA). This EUA will remain  in effect (meaning this test can be used) for the duration of the COVID-19 declaration under Section 564(b)(1) of the Act, 21 U.S.C.section 360bbb-3(b)(1), unless the authorization is terminated  or revoked sooner.       Influenza A by PCR NEGATIVE NEGATIVE Final   Influenza B by PCR NEGATIVE NEGATIVE Final    Comment: (NOTE) The Xpert Xpress SARS-CoV-2/FLU/RSV plus assay is intended as an aid in the diagnosis of influenza from Nasopharyngeal swab specimens and should not be used as a sole basis for treatment. Nasal washings and aspirates are unacceptable for  Xpert Xpress SARS-CoV-2/FLU/RSV testing.  Fact Sheet for Patients: EntrepreneurPulse.com.au  Fact Sheet for Healthcare Providers: IncredibleEmployment.be  This test is not yet approved or cleared by the [place] FDA and has been authorized for detection and/or diagnosis of SARS-CoV-2 by FDA under an Emergency Use Authorization (EUA). This EUA will remain in effect (meaning this test can be used) for the duration of the COVID-19 declaration under Section 564(b)(1) of the Act, 21 U.S.C. section 360bbb-3(b)(1), unless the authorization is terminated or revoked.  Performed at [facility], [address] [place], [place] [address]   Blood culture (routine single)     Status: None (Preliminary result)   Collection Time: 08/27/20  5:50 PM   Specimen: BLOOD  Result Value Ref Range Status   Specimen Description BLOOD BLOOD RIGHT ARM  Final   Special Requests   Final    BOTTLES DRAWN AEROBIC AND ANAEROBIC Blood Culture adequate volume   Culture   Final    NO GROWTH 2 DAYS Performed at [facility], [address], [place], [place] [address]    Report Status PENDING  Incomplete  MRSA PCR Screening     Status: None   Collection Time: 08/28/20 12:06 PM   Specimen: Nasopharyngeal  Result Value Ref Range Status   MRSA by PCR NEGATIVE NEGATIVE Final    Comment:        The GeneXpert MRSA Assay (FDA approved for NASAL specimens only), is one component of a comprehensive MRSA colonization surveillance program. It is not intended to diagnose MRSA infection nor to guide or monitor treatment for MRSA infections. Performed at [facility], [address], [place], [place] [address]      Radiology Studies: ECHOCARDIOGRAM COMPLETE  Result Date: 08/29/2020    ECHOCARDIOGRAM REPORT   Patient Name:   [facility] Date of Exam: 08/28/2020 Medical Rec #:  [id number]  Height:       65.5 in Accession #:    [id number] Weight:        156.8 lb Date of Birth:  22-Jul-1927  BSA:          1.794 m Patient Age:    78 years   BP:           155/59 mmHg Patient Gender: F          HR:           90 bpm. Exam Location:  [facility] Procedure: 2D Echo, Cardiac Doppler and Color Doppler  Indications:     I48.91 Atrial Fibrillation  History:         Patient has no prior history of Echocardiogram examinations.                  Risk Factors:Hypertension. Edema.  Sonographer:     Wilford Sports Rodgers-Jones Referring Phys:  [id number] ERIC CHEN Diagnosing Phys: Kate Sable MD IMPRESSIONS  1. Left ventricular ejection fraction, by estimation, is 60 to 65%. The left ventricle has normal function. The left ventricle has no regional wall motion abnormalities. There is mild left ventricular hypertrophy. Left ventricular diastolic parameters are indeterminate.  2. Right ventricular systolic function is normal. The right ventricular size is normal. There is normal pulmonary artery systolic pressure.  3. The mitral valve is normal in structure. No evidence of mitral valve regurgitation.  4. The aortic valve is tricuspid. Aortic valve regurgitation is not visualized. Mild aortic valve sclerosis is present, with no evidence of aortic valve stenosis.  5. The inferior vena cava is normal in size with greater than 50% respiratory variability, suggesting right atrial pressure of 3 mmHg. FINDINGS  Left Ventricle: Left ventricular ejection fraction, by estimation, is 60 to 65%. The left ventricle has normal function. The left ventricle has no regional wall motion abnormalities. The left ventricular internal cavity size was normal in size. There is  mild left ventricular hypertrophy. Left ventricular diastolic parameters are indeterminate. Right Ventricle: The right ventricular size is normal. No increase in right ventricular wall thickness. Right ventricular systolic function is normal. There is normal pulmonary artery systolic pressure. The tricuspid regurgitant velocity is 2.43 m/s, and   with an assumed right atrial pressure of 3 mmHg, the estimated right ventricular systolic pressure is 16.9 mmHg. Left Atrium: Left atrial size was normal in size. Right Atrium: Right atrial size was normal in size. Pericardium: There is no evidence of pericardial effusion. Mitral Valve: The mitral valve is normal in structure. No evidence of mitral valve regurgitation. Tricuspid Valve: The tricuspid valve is normal in structure. Tricuspid valve regurgitation is trivial. Aortic Valve: The aortic valve is tricuspid. Aortic valve regurgitation is not visualized. Mild aortic valve sclerosis is present, with no evidence of aortic valve stenosis. Pulmonic Valve: The pulmonic valve was not well visualized. Pulmonic valve regurgitation is not visualized. Aorta: The aortic root and ascending aorta are structurally normal, with no evidence of dilitation. Venous: The inferior vena cava is normal in size with greater than 50% respiratory variability, suggesting right atrial pressure of 3 mmHg. IAS/Shunts: No atrial level shunt detected by color flow Doppler.  LEFT VENTRICLE PLAX 2D LVIDd:         3.46 cm LVIDs:         2.44 cm LV PW:         1.21 cm LV IVS:        1.45 cm LVOT diam:     1.90 cm LV SV:         58 LV SV Index:   32 LVOT Area:     2.84 cm  RIGHT VENTRICLE             IVC RV Basal diam:  3.77 cm     IVC diam: 1.37 cm RV S prime:     14.77 cm/s TAPSE (M-mode): 1.5 cm LEFT ATRIUM             Index       RIGHT ATRIUM  Index LA diam:        3.80 cm 2.12 cm/m  RA Area:     13.30 cm LA Vol (A2C):   45.5 ml 25.36 ml/m RA Volume:   33.50 ml  18.67 ml/m LA Vol (A4C):   27.6 ml 15.39 ml/m LA Biplane Vol: 36.2 ml 20.18 ml/m  AORTIC VALVE LVOT Vmax:   130.33 cm/s LVOT Vmean:  91.467 cm/s LVOT VTI:    0.203 m  AORTA Ao Root diam: 3.20 cm Ao Asc diam:  3.40 cm MV E velocity: 113.75 cm/s  TRICUSPID VALVE                             TR Peak grad:   23.6 mmHg                             TR Vmax:        243.00 cm/s                               SHUNTS                             Systemic VTI:  0.20 m                             Systemic Diam: 1.90 cm Kate Sable MD Electronically signed by Kate Sable MD Signature Date/Time: 08/29/2020/12:22:07 PM    Final     Scheduled Meds: . apixaban  5 mg Oral BID  . calcium-vitamin D  2 tablet Oral Daily  . diltiazem  240 mg Oral Daily  . escitalopram  5 mg Oral Daily  . metoprolol tartrate  25 mg Oral BID  . nystatin  5 mL Mouth/Throat QID  . pantoprazole  40 mg Oral Daily  . sodium chloride flush  3 mL Intravenous Q12H   Continuous Infusions: . sodium chloride Stopped (08/28/20 2231)  . ceFEPime (MAXIPIME) IV 2 g (08/29/20 0951)     LOS: 2 days   Time spent: 35 minutes.  Lorella Nimrod, MD [facility]  If 7PM-7AM, please contact night-coverage [email or web address]  08/29/2020, 5:32 PM   This record has been created using Systems analyst. Errors have been sought and corrected,but may not always be located. Such creation errors do not reflect on the standard of care.

## 2020-08-29 NOTE — Progress Notes (Signed)
Pt report received. Resting in bed comfortably She appears to be in no apparent distress Remains on 2L [place] Denies any additional wants or needs at this time Call bell within reach. Will continue to monitor.

## 2020-08-29 NOTE — Plan of Care (Signed)
°  Problem: Clinical Measurements: Goal: Respiratory complications will improve Outcome: Progressing   Problem: Clinical Measurements: Goal: Respiratory complications will improve Outcome: Progressing Goal: Cardiovascular complication will be avoided Outcome: Progressing   Problem: Nutrition: Goal: Adequate nutrition will be maintained Outcome: Progressing   Problem: Safety: Goal: Ability to remain free from injury will improve Outcome: Progressing   Problem: Skin Integrity: Goal: Risk for impaired skin integrity will decrease Outcome: Progressing

## 2020-08-29 NOTE — Evaluation (Signed)
Occupational Therapy Evaluation Patient Details Name: [name] MRN: [id number] DOB: 04-Aug-1927 Today's Date: 08/29/2020    History of Present Illness Per MD notes: Pt is a 86 year old female with a history of hypertension and reflux who presents to the hospital with a 2-week history of cough, congestion.  MD assessment includes: Sepsis due to pneumonia, RLL PNA, acute respiratory failure, rapid A-fib, acute renal failure, GERD, and generalized weakness.   Clinical Impression   Pt was seen for OT evaluation this date. Prior to hospital admission, pt was independent, driving, and ambulating without AD. Pt lives by herself. Currently pt demonstrates impairments as described below (See OT problem list) which functionally limit her ability to perform ADL/self-care tasks. Pt currently requires CGA for ADL mobility, and supervision for LB ADL 2/2 cardiopulmonary status and decreased strength and activity tolerance.  Pt would benefit from skilled OT services to address noted impairments and functional limitations (see below for any additional details) in order to maximize safety and independence while minimizing falls risk and caregiver burden. Upon hospital discharge, recommend HHOT to maximize pt safety and return to functional independence during meaningful occupations of daily life.     Follow Up Recommendations  Home health OT    Equipment Recommendations  None recommended by OT    Recommendations for Other Services       Precautions / Restrictions Precautions Precautions: Fall Restrictions Weight Bearing Restrictions: No Other Position/Activity Restrictions: Per cardiology note: HR goal with ambulation 60-90 bpm      Mobility Bed Mobility               General bed mobility comments: NT, pt in recliner    Transfers Overall transfer level: Needs assistance Equipment used: Rolling walker (2 wheeled) Transfers: Sit to/from Stand Sit to Stand: Min guard         General  transfer comment: Min verbal cues for hand placement with fair to good eccentric and concentric control    Balance Overall balance assessment: Needs assistance Sitting-balance support: Feet supported Sitting balance-Leahy Scale: Normal     Standing balance support: Bilateral upper extremity supported;During functional activity Standing balance-Leahy Scale: Good                             ADL either performed or assessed with clinical judgement   ADL Overall ADL's : Needs assistance/impaired                                       General ADL Comments: CGA for ADL transfers, able to don/doff socks in seated position without assist, VSS throughout     Vision Patient Visual Report: No change from baseline       Perception     Praxis      Pertinent Vitals/Pain Pain Assessment: No/denies pain     Hand Dominance     Extremity/Trunk Assessment Upper Extremity Assessment Upper Extremity Assessment: Overall WFL for tasks assessed;Generalized weakness (age appropriate)   Lower Extremity Assessment Lower Extremity Assessment: Generalized weakness       Communication Communication Communication: No difficulties   Cognition Arousal/Alertness: Awake/alert Behavior During Therapy: WFL for tasks assessed/performed Overall Cognitive Status: Within Functional Limits for tasks assessed  General Comments  VSS throughout on 2L    Exercises Total Joint Exercises Ankle Circles/Pumps: Strengthening;Both;10 reps Quad Sets: Strengthening;Both;10 reps Gluteal Sets: Strengthening;Both;10 reps Hip ABduction/ADduction: Strengthening;Both;10 reps Straight Leg Raises: Strengthening;Both;10 reps Long Arc Quad: Strengthening;Both;10 reps Marching in Standing: Strengthening;Both;10 reps;Standing Other Exercises Other Exercises: HEP education for BLE APs, QS, and GS x 10 each every 1-2 hours daily   Shoulder  Instructions      Home Living Family/patient expects to be discharged to:: Private residence Living Arrangements: Alone Available Help at Discharge: Family;Available 24 hours/day Type of Home: House Home Access: Stairs to enter [employer] of Steps: 1 small step to enter Entrance Stairs-Rails: None Home Layout: One level     Bathroom Shower/Tub: Teacher, early years/pre: Handicapped height     Home Equipment: Environmental consultant - 2 wheels;Shower seat          Prior Functioning/Environment Level of Independence: Independent        Comments: Ind amb community distances without an AD, no fall history, Ind with ADLs, drives        OT Problem List: Cardiopulmonary status limiting activity;Decreased strength;Decreased activity tolerance;Decreased knowledge of use of DME or AE      OT Treatment/Interventions: Self-care/ADL training;Therapeutic exercise;Therapeutic activities;Energy conservation;DME and/or AE instruction;Patient/family education    OT Goals(Current goals can be found in the care plan section) Acute Rehab OT Goals Patient Stated Goal: To get stronger OT Goal Formulation: With patient/family Time For Goal Achievement: 09/12/20 Potential to Achieve Goals: Good ADL Goals Pt Will Transfer to Toilet: with supervision;ambulating (comfort height, LRAD for amb) Additional ADL Goal #1: Pt will verbalize plan to implement at least 2 learned ECS to maximize safety/indep with ADL tasks and minimize risk of SOB/over exertion Additional ADL Goal #2: Pt will perform bathing/dressing tasks with supervision and set up, primarily in seated position, utilizing learned ECS to minimize SOB.  OT Frequency: Min 1X/week   Barriers to D/C:            Co-evaluation              AM-PAC OT "6 Clicks" Daily Activity     Outcome Measure Help from another person eating meals?: None Help from another person taking care of personal grooming?: None Help from another  person toileting, which includes using toliet, bedpan, or urinal?: A Little Help from another person bathing (including washing, rinsing, drying)?: A Little Help from another person to put on and taking off regular upper body clothing?: None Help from another person to put on and taking off regular lower body clothing?: None 6 Click Score: 22   End of Session Nurse Communication: Other (comment) (no BM since Monday, wants bath, incentive spirometer appropriate?)  Activity Tolerance: Patient tolerated treatment well Patient left: in chair;with call bell/phone within reach;with chair alarm set;with family/visitor present  OT Visit Diagnosis: Other abnormalities of gait and mobility (R26.89);Muscle weakness (generalized) (M62.81)                Time: 7062-3762 OT Time Calculation (min): 13 min Charges:  OT General Charges $OT Visit: 1 Visit OT Evaluation $OT Eval Low Complexity: 1 Low  Jeni Salles, MPH, MS, OTR/L ascom [phone] 08/29/20, 4:16 PM

## 2020-08-30 ENCOUNTER — Encounter: Payer: PRIVATE HEALTH INSURANCE | Admitting: Dermatology

## 2020-08-30 DIAGNOSIS — J9601 Acute respiratory failure with hypoxia: Secondary | ICD-10-CM | POA: Diagnosis not present

## 2020-08-30 DIAGNOSIS — K219 Gastro-esophageal reflux disease without esophagitis: Secondary | ICD-10-CM | POA: Diagnosis not present

## 2020-08-30 DIAGNOSIS — J189 Pneumonia, unspecified organism: Secondary | ICD-10-CM | POA: Diagnosis not present

## 2020-08-30 DIAGNOSIS — I4891 Unspecified atrial fibrillation: Secondary | ICD-10-CM | POA: Diagnosis not present

## 2020-08-30 LAB — LEGIONELLA PNEUMOPHILA SEROGP 1 UR AG: L. pneumophila Serogp 1 Ur Ag: NEGATIVE

## 2020-08-30 LAB — URINE CULTURE: Culture: NO GROWTH

## 2020-08-30 MED ORDER — POLYETHYLENE GLYCOL 3350 17 G PO PACK
17.0000 g | PACK | Freq: Every day | ORAL | Status: DC
  Administered 2020-08-30 – 2020-09-03 (×5): 17 g via ORAL
  Filled 2020-08-30 (×5): qty 1

## 2020-08-30 NOTE — Care Management Important Message (Signed)
Important Message  Patient Details  Name: [name] MRN: [id number] Date of Birth: January 12, 1927   Medicare Important Message Given:  Yes     Dannette Barbara 08/30/2020, 2:59 PM

## 2020-08-30 NOTE — Progress Notes (Signed)
PROGRESS NOTE    [name]  QBV:[id number] DOB: 22-Jul-1927 DOA: 08/27/2020 PCP: Rusty Aus, MD   Brief Narrative: Taken from prior notes. 86 year old female with a history of hypertension, reflux who presents to the hospital ER from the PCP office.  Patient's had a 2-week history of cough, congestion.  Patient states that she normally gets an upper respiratory infection in November.  Patient has been on Levaquin and prednisone.  This was changed to Ceftin and prednisone.  She followed up with her PCP in the office today.  She was noted to be hypoxic with room air saturations in the mid 80s.  She was also tachycardic.  She was sent to the ER for evaluation. Also found to have new onset A. fib with RVR.  CTA negative for PE, she had 8/10 right lower lobe infiltrate.  Admitted for sepsis secondary to pneumonia with failed outpatient therapy.  Subjective: Patient was seen and examined today.  No new complaint.  Continues to have cough which is aggravated with deep breathing and talking.  Assessment & Plan:   Principal Problem:   RLL pneumonia Active Problems:   GERD (gastroesophageal reflux disease)   HTN (hypertension), benign   Acute respiratory failure with hypoxia (HCC)   Hyponatremia   Sepsis due to pneumonia (HCC)   Atrial fibrillation with RVR (HCC)   DNR (do not resuscitate)/DNI(Do Not Intubate)   Hypoxia  Sepsis secondary to right lower lobe pneumonia.  Failed outpatient treatment.  CTA with no PE but did shows multifocal pneumonia predominantly involving right lower lobe.  Continues to have cough which increased with deep breathing.  Initially received cefepime and vancomycin, vancomycin was discontinued as MRSA PCR was negative.  Strep pneumo antigen negative.  Procalcitonin negative -Continue with cefepime-will complete course tomorrow. -Continue doxycycline to cover atypical. -Continue with supportive care.  A. fib with RVR.  Patient was on IV Cardizem.  Cardiology was  consulted-appreciate their recommendations, IV Cardizem was converted with p.o. rate well controlled today. -Continue Cardizem. -Continue metoprolol -Continue with Eliquis  AKI.  Resolved  GERD. -Continue with PPI.  Hypertension.  Blood pressure within goal -Continue holding home meds. -Continue with Cardizem.  Generalized weakness.  PT recommending home health services, which were ordered.  Objective: Vitals:   08/30/20 1555 08/30/20 1556 08/30/20 1557 08/30/20 1558  BP:    136/71  Pulse: 61 (!) 29 63 68  Resp: (!) 29 (!) 31 (!) 28 (!) 23  Temp:    98.2 F (36.8 C)  TempSrc:    Oral  SpO2: 94% 97% 92% 93%  Weight:      Height:        Intake/Output Summary (Last 24 hours) at 08/30/2020 1619 Last data filed at 08/30/2020 1506 Gross per 24 hour  Intake 588.56 ml  Output 2300 ml  Net -1711.44 ml   Filed Weights   08/28/20 0431 08/29/20 0456 08/30/20 0406  Weight: 71.1 kg 70.9 kg 69 kg    Examination:  General.  Well-developed elderly lady, in no acute distress. Pulmonary.  Lungs clear bilaterally, normal respiratory effort. CV.  Irregular rhythm, no JVD, rub or murmur. Abdomen.  Soft, nontender, nondistended, BS positive. CNS.  Alert and oriented x3.  No focal neurologic deficit. Extremities.  No edema, no cyanosis, pulses intact and symmetrical. Psychiatry.  Judgment and insight appears normal.  DVT prophylaxis: Lovenox Code Status: DNR Family Communication: Discussed with patient, called sister with no response. Disposition Plan:  Status is: Inpatient  Remains inpatient appropriate  because:Inpatient level of care appropriate due to severity of illness   Dispo: The patient is from: Home              Anticipated d/c is to: Home              Anticipated d/c date is: 1 day              Patient currently is not medically stable to d/c.   Consultants:   Cardiology  Procedures:  Antimicrobials:  Cefepime Doxycycline  Data Reviewed: I have personally  reviewed following labs and imaging studies  CBC: Recent Labs  Lab 08/27/20 1501 08/27/20 1510 08/28/20 0503  WBC 25.7* 25.2* 18.6*  NEUTROABS  --  22.4* 14.3*  HGB 15.8* 15.5* 12.4  HCT 46.9* 46.0 37.1  MCV 88.0 89.1 89.2  PLT 442* 443* 149   Basic Metabolic Panel: Recent Labs  Lab 08/27/20 1501 08/27/20 1507 08/28/20 0503  NA 127*  --  133*  K 4.3  --  3.3*  CL 90*  --  102  CO2 24  --  21*  GLUCOSE 131*  --  111*  BUN 28*  --  20  CREATININE 1.19*  --  0.95  CALCIUM 9.9  --  8.7*  MG  --  1.7  --    GFR: Estimated Creatinine Clearance: 35.4 mL/min (by C-G formula based on SCr of 0.95 mg/dL). Liver Function Tests: Recent Labs  Lab 08/27/20 2036 08/28/20 0503  AST 21 19  ALT 16 16  ALKPHOS 50 53  BILITOT 0.6 0.8  PROT 5.7* 5.4*  ALBUMIN 2.7* 2.6*   No results for input(s): LIPASE, AMYLASE in the last 168 hours. No results for input(s): AMMONIA in the last 168 hours. Coagulation Profile: Recent Labs  Lab 08/27/20 2036  INR 1.2   Cardiac Enzymes: No results for input(s): CKTOTAL, CKMB, CKMBINDEX, TROPONINI in the last 168 hours. BNP (last 3 results) No results for input(s): PROBNP in the last 8760 hours. HbA1C: No results for input(s): HGBA1C in the last 72 hours. CBG: No results for input(s): GLUCAP in the last 168 hours. Lipid Profile: No results for input(s): CHOL, HDL, LDLCALC, TRIG, CHOLHDL, LDLDIRECT in the last 72 hours. Thyroid Function Tests: No results for input(s): TSH, T4TOTAL, FREET4, T3FREE, THYROIDAB in the last 72 hours. Anemia Panel: No results for input(s): VITAMINB12, FOLATE, FERRITIN, TIBC, IRON, RETICCTPCT in the last 72 hours. Sepsis Labs: Recent Labs  Lab 08/27/20 1708 08/27/20 2036 08/27/20 2238  PROCALCITON <0.10  --   --   LATICACIDVEN  --  1.5 1.8    Recent Results (from the past 240 hour(s))  Resp Panel by RT-PCR (Flu A&B, Covid) Nasopharyngeal Swab     Status: None   Collection Time: 08/27/20  3:02 PM    Specimen: Nasopharyngeal Swab; Nasopharyngeal(NP) swabs in vial transport medium  Result Value Ref Range Status   SARS Coronavirus 2 by RT PCR NEGATIVE NEGATIVE Final    Comment: (NOTE) SARS-CoV-2 target nucleic acids are NOT DETECTED.  The SARS-CoV-2 RNA is generally detectable in upper respiratory specimens during the acute phase of infection. The lowest concentration of SARS-CoV-2 viral copies this assay can detect is 138 copies/mL. A negative result does not preclude SARS-Cov-2 infection and should not be used as the sole basis for treatment or other patient management decisions. A negative result may occur with  improper specimen collection/handling, submission of specimen other than nasopharyngeal swab, presence of viral mutation(s) within the areas targeted by  this assay, and inadequate number of viral copies(<138 copies/mL). A negative result must be combined with clinical observations, patient history, and epidemiological information. The expected result is Negative.  Fact Sheet for Patients:  EntrepreneurPulse.com.au  Fact Sheet for Healthcare Providers:  IncredibleEmployment.be  This test is no t yet approved or cleared by the [place] FDA and  has been authorized for detection and/or diagnosis of SARS-CoV-2 by FDA under an Emergency Use Authorization (EUA). This EUA will remain  in effect (meaning this test can be used) for the duration of the COVID-19 declaration under Section 564(b)(1) of the Act, 21 U.S.C.section 360bbb-3(b)(1), unless the authorization is terminated  or revoked sooner.       Influenza A by PCR NEGATIVE NEGATIVE Final   Influenza B by PCR NEGATIVE NEGATIVE Final    Comment: (NOTE) The Xpert Xpress SARS-CoV-2/FLU/RSV plus assay is intended as an aid in the diagnosis of influenza from Nasopharyngeal swab specimens and should not be used as a sole basis for treatment. Nasal washings and aspirates are  unacceptable for Xpert Xpress SARS-CoV-2/FLU/RSV testing.  Fact Sheet for Patients: EntrepreneurPulse.com.au  Fact Sheet for Healthcare Providers: IncredibleEmployment.be  This test is not yet approved or cleared by the [place] FDA and has been authorized for detection and/or diagnosis of SARS-CoV-2 by FDA under an Emergency Use Authorization (EUA). This EUA will remain in effect (meaning this test can be used) for the duration of the COVID-19 declaration under Section 564(b)(1) of the Act, 21 U.S.C. section 360bbb-3(b)(1), unless the authorization is terminated or revoked.  Performed at [facility], [address] [place], [place] [address]   Blood culture (routine single)     Status: None (Preliminary result)   Collection Time: 08/27/20  5:50 PM   Specimen: BLOOD  Result Value Ref Range Status   Specimen Description BLOOD BLOOD RIGHT ARM  Final   Special Requests   Final    BOTTLES DRAWN AEROBIC AND ANAEROBIC Blood Culture adequate volume   Culture   Final    NO GROWTH 3 DAYS Performed at [facility], [address], [place], [place] [address]    Report Status PENDING  Incomplete  MRSA PCR Screening     Status: None   Collection Time: 08/28/20 12:06 PM   Specimen: Nasopharyngeal  Result Value Ref Range Status   MRSA by PCR NEGATIVE NEGATIVE Final    Comment:        The GeneXpert MRSA Assay (FDA approved for NASAL specimens only), is one component of a comprehensive MRSA colonization surveillance program. It is not intended to diagnose MRSA infection nor to guide or monitor treatment for MRSA infections. Performed at [facility], [address], [place], [place] [address]   Urine Culture     Status: None   Collection Time: 08/29/20  5:00 AM   Specimen: In/Out Cath Urine  Result Value Ref Range Status   Specimen Description   Final    IN/OUT CATH URINE Performed at [facility],  [address] [place], [place] [address]    Special Requests   Final    NONE Performed at [facility], [address] [place], [place] [address]    Culture   Final    NO GROWTH Performed at [facility], [place] [address] [place], [place] [address]    Report Status 08/30/2020 FINAL  Final     Radiology Studies: ECHOCARDIOGRAM COMPLETE  Result Date: 08/29/2020    ECHOCARDIOGRAM REPORT  Patient Name:   RIYANNA CRUTCHLEY Date of Exam: 08/28/2020 Medical Rec #:  [id number]  Height:       65.5 in Accession #:    [id number] Weight:       156.8 lb Date of Birth:  [birth year]  BSA:          1.794 m Patient Age:    54 years   BP:           155/59 mmHg Patient Gender: F          HR:           90 bpm. Exam Location:  [facility] Procedure: 2D Echo, Cardiac Doppler and Color Doppler Indications:     I48.91 Atrial Fibrillation  History:         Patient has no prior history of Echocardiogram examinations.                  Risk Factors:Hypertension. Edema.  Sonographer:     Wilford Sports Rodgers-Jones Referring Phys:  [id number] ERIC CHEN Diagnosing Phys: Kate Sable MD IMPRESSIONS  1. Left ventricular ejection fraction, by estimation, is 60 to 65%. The left ventricle has normal function. The left ventricle has no regional wall motion abnormalities. There is mild left ventricular hypertrophy. Left ventricular diastolic parameters are indeterminate.  2. Right ventricular systolic function is normal. The right ventricular size is normal. There is normal pulmonary artery systolic pressure.  3. The mitral valve is normal in structure. No evidence of mitral valve regurgitation.  4. The aortic valve is tricuspid. Aortic valve regurgitation is not visualized. Mild aortic valve sclerosis is present, with no evidence of aortic valve stenosis.  5. The inferior vena cava is normal in size with greater than 50% respiratory variability, suggesting right atrial pressure of 3 mmHg. FINDINGS  Left Ventricle: Left ventricular  ejection fraction, by estimation, is 60 to 65%. The left ventricle has normal function. The left ventricle has no regional wall motion abnormalities. The left ventricular internal cavity size was normal in size. There is  mild left ventricular hypertrophy. Left ventricular diastolic parameters are indeterminate. Right Ventricle: The right ventricular size is normal. No increase in right ventricular wall thickness. Right ventricular systolic function is normal. There is normal pulmonary artery systolic pressure. The tricuspid regurgitant velocity is 2.43 m/s, and  with an assumed right atrial pressure of 3 mmHg, the estimated right ventricular systolic pressure is 59.1 mmHg. Left Atrium: Left atrial size was normal in size. Right Atrium: Right atrial size was normal in size. Pericardium: There is no evidence of pericardial effusion. Mitral Valve: The mitral valve is normal in structure. No evidence of mitral valve regurgitation. Tricuspid Valve: The tricuspid valve is normal in structure. Tricuspid valve regurgitation is trivial. Aortic Valve: The aortic valve is tricuspid. Aortic valve regurgitation is not visualized. Mild aortic valve sclerosis is present, with no evidence of aortic valve stenosis. Pulmonic Valve: The pulmonic valve was not well visualized. Pulmonic valve regurgitation is not visualized. Aorta: The aortic root and ascending aorta are structurally normal, with no evidence of dilitation. Venous: The inferior vena cava is normal in size with greater than 50% respiratory variability, suggesting right atrial pressure of 3 mmHg. IAS/Shunts: No atrial level shunt detected by color flow Doppler.  LEFT VENTRICLE PLAX 2D LVIDd:         3.46 cm LVIDs:         2.44 cm LV PW:         1.21 cm LV IVS:  1.45 cm LVOT diam:     1.90 cm LV SV:         58 LV SV Index:   32 LVOT Area:     2.84 cm  RIGHT VENTRICLE             IVC RV Basal diam:  3.77 cm     IVC diam: 1.37 cm RV S prime:     14.77 cm/s TAPSE  (M-mode): 1.5 cm LEFT ATRIUM             Index       RIGHT ATRIUM           Index LA diam:        3.80 cm 2.12 cm/m  RA Area:     13.30 cm LA Vol (A2C):   45.5 ml 25.36 ml/m RA Volume:   33.50 ml  18.67 ml/m LA Vol (A4C):   27.6 ml 15.39 ml/m LA Biplane Vol: 36.2 ml 20.18 ml/m  AORTIC VALVE LVOT Vmax:   130.33 cm/s LVOT Vmean:  91.467 cm/s LVOT VTI:    0.203 m  AORTA Ao Root diam: 3.20 cm Ao Asc diam:  3.40 cm MV E velocity: 113.75 cm/s  TRICUSPID VALVE                             TR Peak grad:   23.6 mmHg                             TR Vmax:        243.00 cm/s                              SHUNTS                             Systemic VTI:  0.20 m                             Systemic Diam: 1.90 cm Kate Sable MD Electronically signed by Kate Sable MD Signature Date/Time: 08/29/2020/12:22:07 PM    Final     Scheduled Meds: . apixaban  5 mg Oral BID  . calcium-vitamin D  2 tablet Oral Daily  . diltiazem  240 mg Oral Daily  . doxycycline  100 mg Oral Q12H  . escitalopram  5 mg Oral Daily  . metoprolol tartrate  25 mg Oral BID  . nystatin  5 mL Mouth/Throat QID  . pantoprazole  40 mg Oral Daily  . polyethylene glycol  17 g Oral Daily  . sodium chloride flush  3 mL Intravenous Q12H   Continuous Infusions: . sodium chloride 10 mL/hr at 08/30/20 0400  . ceFEPime (MAXIPIME) IV 2 g (08/30/20 0928)     LOS: 3 days   Time spent: 30 minutes.  Lorella Nimrod, MD [facility]  If 7PM-7AM, please contact night-coverage [email or web address]  08/30/2020, 4:19 PM   This record has been created using Systems analyst. Errors have been sought and corrected,but may not always be located. Such creation errors do not reflect on the standard of care.

## 2020-08-30 NOTE — Progress Notes (Signed)
[facility] Encounter Note  Patient: [name] / Admit Date: 08/27/2020 / Date of Encounter: 08/30/2020, 8:10 AM   Subjective: 12/1 patient overall feeling much better since admission.  No evidence of significant cough or congestion overnight.  Patient has not had any significant hypotension or rapid heart rate his current medical regimen for atrial fibrillation heart rate control.  Troponin still minimal most consistent with demand ischemia rather than acute coronary syndrome.  No heart failure symptoms.  Talk with the patient with long discussion about anticoagulation and the patient currently has no evidence of significant bleeding concerns and will be appropriate for anticoagulation for atrial fibrillation.  Heart rate control at rest reasonable but likely will need additional medication management  12/2 patient did have some cough and congestion last night but overall appears that the patient is still quite comfortable from shortness of breath standpoint.  Patient is slowly improving with appropriate pulmonary and right lower lobe pneumonia treatment.  Patient has had continued atrial fibrillation with controlled ventricular rate but better heart rate control with addition of metoprolol.  Currently resting heart rate is 60 bpm.  She has had addition of Eliquis which she has tolerated well and there is no significant side effects at this time.  Review of Systems: Positive for: Shortness of breath Negative for: Vision change, hearing change, syncope, dizziness, nausea, vomiting,diarrhea, bloody stool, stomach pain, cough, congestion, diaphoresis, urinary frequency, urinary pain,skin lesions, skin rashes Others previously listed  Objective: Telemetry: Atrial fibrillation with controlled ventricular rate Physical Exam: Blood pressure (!) 161/64, pulse 76, temperature 98.6 F (37 C), temperature source Oral, resp. rate 18, height 5' 5.5" (1.664 m), weight 69 kg, SpO2 98 %.  Body mass index is 24.94 kg/m. General: Well developed, well nourished, in no acute distress. Head: Normocephalic, atraumatic, sclera non-icteric, no xanthomas, nares are without discharge. Neck: No apparent masses Lungs: Normal respirations with some wheezes, diffuse rhonchi, no rales , no crackles   Heart: Irregular rate and rhythm, normal S1 S2, no murmur, no rub, no gallop, PMI is normal size and placement, carotid upstroke normal without bruit, jugular venous pressure normal Abdomen: Soft, non-tender, non-distended with normoactive bowel sounds. No hepatosplenomegaly. Abdominal aorta is normal size without bruit Extremities: No edema, no clubbing, no cyanosis, no ulcers,  Peripheral: 2+ radial, 2+ femoral, 2+ dorsal pedal pulses Neuro: Alert and oriented. Moves all extremities spontaneously. Psych:  Responds to questions appropriately with a normal affect.   Intake/Output Summary (Last 24 hours) at 08/30/2020 0810 Last data filed at 08/30/2020 0405 Gross per 24 hour  Intake 608.07 ml  Output 1850 ml  Net -1241.93 ml    Inpatient Medications:  . apixaban  5 mg Oral BID  . calcium-vitamin D  2 tablet Oral Daily  . diltiazem  240 mg Oral Daily  . doxycycline  100 mg Oral Q12H  . escitalopram  5 mg Oral Daily  . metoprolol tartrate  25 mg Oral BID  . nystatin  5 mL Mouth/Throat QID  . pantoprazole  40 mg Oral Daily  . sodium chloride flush  3 mL Intravenous Q12H   Infusions:  . sodium chloride 10 mL/hr at 08/30/20 0400  . ceFEPime (MAXIPIME) IV Stopped (08/29/20 2318)    Labs: Recent Labs    08/27/20 1501 08/27/20 1507 08/28/20 0503  NA 127*  --  133*  K 4.3  --  3.3*  CL 90*  --  102  CO2 24  --  21*  GLUCOSE 131*  --  111*  BUN 28*  --  20  CREATININE 1.19*  --  0.95  CALCIUM 9.9  --  8.7*  MG  --  1.7  --    Recent Labs    08/27/20 2036 08/28/20 0503  AST 21 19  ALT 16 16  ALKPHOS 50 53  BILITOT 0.6 0.8  PROT 5.7* 5.4*  ALBUMIN 2.7* 2.6*   Recent  Labs    08/27/20 1510 08/28/20 0503  WBC 25.2* 18.6*  NEUTROABS 22.4* 14.3*  HGB 15.5* 12.4  HCT 46.0 37.1  MCV 89.1 89.2  PLT 443* 315   No results for input(s): CKTOTAL, CKMB, TROPONINI in the last 72 hours. Invalid input(s): POCBNP No results for input(s): HGBA1C in the last 72 hours.   Weights: Filed Weights   08/28/20 0431 08/29/20 0456 08/30/20 0406  Weight: 71.1 kg 70.9 kg 69 kg     Radiology/Studies:  CT Angio Chest PE W and/or Wo Contrast  Result Date: 08/27/2020 CLINICAL DATA:  86 year old female with concern for pulmonary embolism. EXAM: CT ANGIOGRAPHY CHEST WITH CONTRAST TECHNIQUE: Multidetector CT imaging of the chest was performed using the standard protocol during bolus administration of intravenous contrast. Multiplanar CT image reconstructions and MIPs were obtained to evaluate the vascular anatomy. CONTRAST:  59mL OMNIPAQUE IOHEXOL 350 MG/ML SOLN COMPARISON:  Chest CT dated 03/05/2009 and radiograph dated 08/27/2020. FINDINGS: Cardiovascular: There is no cardiomegaly or pericardial effusion. There is coronary vascular calcification. Mild atherosclerotic calcification of the thoracic aorta. No aneurysmal dilatation. No pulmonary artery embolus identified. Mediastinum/Nodes: Right hilar adenopathy measures 15 mm. Subcarinal lymph node measures 15 mm short axis. There is a small hiatal hernia. The esophagus is grossly unremarkable. No mediastinal fluid collection. Lungs/Pleura: Mild bilateral upper lobe bronchiectasis. There are clusters of nodular and streaky densities in the right upper lobe with areas of scarring. Findings consistent with acute pneumonia superimposed on background of chronic changes. Focal area of scarring noted in the lingula. There is an area of consolidative change at the right lung base. There is diffuse nodular densities throughout the right lung consistent with infection, possibly atypical in etiology or aspiration. Clinical correlation is  recommended. There is peribronchial thickening primarily in the right lower lobe. No pleural effusion pneumothorax. The central airways are patent. Upper Abdomen: None Musculoskeletal: Degenerative changes of the spine. No acute osseous pathology. Review of the MIP images confirms the above findings. IMPRESSION: 1. No CT evidence of pulmonary embolism. 2. Multifocal pneumonia predominantly involving the right lower lobe, possibly atypical in etiology or aspiration. Clinical correlation and follow-up to resolution recommended. 3. Aortic Atherosclerosis (ICD10-I70.0). Electronically Signed   By: Anner Crete M.D.   On: 08/27/2020 16:30   DG Chest Portable 1 View  Result Date: 08/27/2020 CLINICAL DATA:  Shortness of breath, tachycardia EXAM: PORTABLE CHEST 1 VIEW COMPARISON:  Radiograph 06/30/2018, CT 03/05/2009 FINDINGS: Chronic hyperinflation and coarsened interstitial changes are again seen albeit with new areas of more patchy and consolidative opacity in the right suprahilar lung and right lung base with associated airways thickening. Left lung remains relatively clear. No pneumothorax. Blunting of the right costophrenic sulcus could reflect scarring or trace effusion. Degenerative changes are present in the imaged spine and shoulders. Telemetry leads overlie the chest. IMPRESSION: 1. Areas of patchy and consolidative opacity in the right suprahilar lung and right lung base with associated airways thickening, concerning for pneumonia or sequela aspiration given history of dysphagia and esophageal spasm. 2. Chronic hyperinflation and coarsened interstitial changes. 3. Possible trace right  pleural effusion or scarring. Electronically Signed   By: Lovena Le M.D.   On: 08/27/2020 15:28   ECHOCARDIOGRAM COMPLETE  Result Date: 08/29/2020    ECHOCARDIOGRAM REPORT   Patient Name:   [facility] Date of Exam: 08/28/2020 Medical Rec #:  [id number]  Height:       65.5 in Accession #:    [id number] Weight:        156.8 lb Date of Birth:  May 01, 1927  BSA:          1.794 m Patient Age:    86 years   BP:           155/59 mmHg Patient Gender: F          HR:           90 bpm. Exam Location:  [facility] Procedure: 2D Echo, Cardiac Doppler and Color Doppler Indications:     I48.91 Atrial Fibrillation  History:         Patient has no prior history of Echocardiogram examinations.                  Risk Factors:Hypertension. Edema.  Sonographer:     Wilford Sports Rodgers-Jones Referring Phys:  [id number] ERIC CHEN Diagnosing Phys: Kate Sable MD IMPRESSIONS  1. Left ventricular ejection fraction, by estimation, is 60 to 65%. The left ventricle has normal function. The left ventricle has no regional wall motion abnormalities. There is mild left ventricular hypertrophy. Left ventricular diastolic parameters are indeterminate.  2. Right ventricular systolic function is normal. The right ventricular size is normal. There is normal pulmonary artery systolic pressure.  3. The mitral valve is normal in structure. No evidence of mitral valve regurgitation.  4. The aortic valve is tricuspid. Aortic valve regurgitation is not visualized. Mild aortic valve sclerosis is present, with no evidence of aortic valve stenosis.  5. The inferior vena cava is normal in size with greater than 50% respiratory variability, suggesting right atrial pressure of 3 mmHg. FINDINGS  Left Ventricle: Left ventricular ejection fraction, by estimation, is 60 to 65%. The left ventricle has normal function. The left ventricle has no regional wall motion abnormalities. The left ventricular internal cavity size was normal in size. There is  mild left ventricular hypertrophy. Left ventricular diastolic parameters are indeterminate. Right Ventricle: The right ventricular size is normal. No increase in right ventricular wall thickness. Right ventricular systolic function is normal. There is normal pulmonary artery systolic pressure. The tricuspid regurgitant velocity is 2.43 m/s, and   with an assumed right atrial pressure of 3 mmHg, the estimated right ventricular systolic pressure is 65.9 mmHg. Left Atrium: Left atrial size was normal in size. Right Atrium: Right atrial size was normal in size. Pericardium: There is no evidence of pericardial effusion. Mitral Valve: The mitral valve is normal in structure. No evidence of mitral valve regurgitation. Tricuspid Valve: The tricuspid valve is normal in structure. Tricuspid valve regurgitation is trivial. Aortic Valve: The aortic valve is tricuspid. Aortic valve regurgitation is not visualized. Mild aortic valve sclerosis is present, with no evidence of aortic valve stenosis. Pulmonic Valve: The pulmonic valve was not well visualized. Pulmonic valve regurgitation is not visualized. Aorta: The aortic root and ascending aorta are structurally normal, with no evidence of dilitation. Venous: The inferior vena cava is normal in size with greater than 50% respiratory variability, suggesting right atrial pressure of 3 mmHg. IAS/Shunts: No atrial level shunt detected by color flow Doppler.  LEFT VENTRICLE PLAX 2D LVIDd:  3.46 cm LVIDs:         2.44 cm LV PW:         1.21 cm LV IVS:        1.45 cm LVOT diam:     1.90 cm LV SV:         58 LV SV Index:   32 LVOT Area:     2.84 cm  RIGHT VENTRICLE             IVC RV Basal diam:  3.77 cm     IVC diam: 1.37 cm RV S prime:     14.77 cm/s TAPSE (M-mode): 1.5 cm LEFT ATRIUM             Index       RIGHT ATRIUM           Index LA diam:        3.80 cm 2.12 cm/m  RA Area:     13.30 cm LA Vol (A2C):   45.5 ml 25.36 ml/m RA Volume:   33.50 ml  18.67 ml/m LA Vol (A4C):   27.6 ml 15.39 ml/m LA Biplane Vol: 36.2 ml 20.18 ml/m  AORTIC VALVE LVOT Vmax:   130.33 cm/s LVOT Vmean:  91.467 cm/s LVOT VTI:    0.203 m  AORTA Ao Root diam: 3.20 cm Ao Asc diam:  3.40 cm MV E velocity: 113.75 cm/s  TRICUSPID VALVE                             TR Peak grad:   23.6 mmHg                             TR Vmax:        243.00 cm/s                               SHUNTS                             Systemic VTI:  0.20 m                             Systemic Diam: 1.90 cm Kate Sable MD Electronically signed by Kate Sable MD Signature Date/Time: 08/29/2020/12:22:07 PM    Final      Assessment and Recommendation  86 y.o. female with borderline hypertension having right lung pneumonia with new onset atrial fibrillation with rapid ventricular rate now with better heart rate control on diltiazem without evidence of congestive heart failure acute coronary syndrome and/or myocardial infarction 1.  Continue heart rate control with oral diltiazem at 240 mg CD each day and 25 mg of metoprolol for a goal heart rate with ambulation between 60 and 90 bpm 2.  Continuation of Eliquis at 5 mg twice per day for further risk reduction stroke with atrial fibrillation.  Will follow for any bleeding or bruising complications 3.  No further cardiac diagnostics necessary at this time due to no evidence of angina or congestive heart failure 4.  Continue supportive care of pneumonia hypoxia and with pulmonary toilet 5.  When ambulating well with good heart rate control and improved from pulmonary standpoint will be able to potentially discharge to home  Signed, Serafina Royals M.D. FACC

## 2020-08-30 NOTE — Progress Notes (Signed)
Mobility Specialist - Progress Note   08/30/20 1400  Mobility  Activity Ambulated in hall  Level of Assistance Standby assist, set-up cues, supervision of patient - no hands on  Assistive Device Front wheel walker  Distance Ambulated (ft) 100 ft  Mobility Response Tolerated well  Mobility performed by Mobility specialist  $Mobility charge 1 Mobility    Pre-mobility: 70 HR, 94% SpO2 During mobility: 88 HR, 86% SpO2 Post-mobility: 73 HR, 92% SpO2   Pt was long-sitting in bed upon arrival, utilizing room air. Pt agreed to session. Pt denied any pain, nausea, or fatigue. Pt was able to get EOB SBA, and stood to RW with minA from elevated height. Pt ambulated 100' in hall with SBA. CGA used for safety precautions. No LOB noted. Noted pt's O2 > 87% with activity, but desats to 86% when coughing. Pt stated, "I cough every time I breathe out." Pt also c/o "L leg pain d/t poor circulation". Pt returned EOB-supine with no physical assistance. Pt's O2 desats to 84% when returned to supine. Mobility reapplied [place] on 1L to increase sats prior to exit. Nurse notified.    Kathee Delton Mobility Specialist 08/30/20, 2:27 PM

## 2020-08-31 DIAGNOSIS — I4891 Unspecified atrial fibrillation: Secondary | ICD-10-CM | POA: Diagnosis not present

## 2020-08-31 DIAGNOSIS — J189 Pneumonia, unspecified organism: Secondary | ICD-10-CM | POA: Diagnosis not present

## 2020-08-31 DIAGNOSIS — J9601 Acute respiratory failure with hypoxia: Secondary | ICD-10-CM | POA: Diagnosis not present

## 2020-08-31 DIAGNOSIS — K219 Gastro-esophageal reflux disease without esophagitis: Secondary | ICD-10-CM | POA: Diagnosis not present

## 2020-08-31 MED ORDER — FLUTICASONE PROPIONATE 50 MCG/ACT NA SUSP
1.0000 | Freq: Every day | NASAL | Status: DC
  Administered 2020-08-31 – 2020-09-03 (×4): 1 via NASAL
  Filled 2020-08-31: qty 16

## 2020-08-31 NOTE — Progress Notes (Signed)
Mobility Specialist - Progress Note   08/31/20 1328  Mobility  Activity Ambulated in room;Ambulated in hall  Level of Assistance Contact guard assist, steadying assist  [place] wheel walker  Distance Ambulated (ft) 80 ft  Mobility Response Tolerated well  Mobility performed by Mobility specialist  $Mobility charge 1 Mobility    Pt laying in bed w/ HOB elevated upon arrival. Pt agreed to session. Pt able to get to EOB mod. Independently. Pt S2S to RW w/ CGA. Pt ambulated 80' total in room and hallway using a RW w/ CGA. No LOB noted. No c/o pain. States "only feeling very weak". Pt has a slow and steady gait pattern. Slight SOB noted. Pt educated on utilizing PLB to manage SOB. O2 monitored t/o session. O2 sats listed below. Overall, pt tolerated session well. Pt left laying in bed w/ alarm set and lunch placed in front of her. All needs placed in reach.   SaO2 on room air at rest = 90% SaO2 on room air while ambulating = 89-96%     Exa Bomba Mobility Specialist  08/31/20, 1:34 PM

## 2020-08-31 NOTE — Plan of Care (Signed)
  Problem: Education: Goal: Knowledge of General Education information will improve Description: Including pain rating scale, medication(s)/side effects and non-pharmacologic comfort measures 08/31/2020 2340 by Ballard Russell, RN Outcome: Progressing 08/31/2020 2339 by Ballard Russell, RN Outcome: Progressing   Problem: Health Behavior/Discharge Planning: Goal: Ability to manage health-related needs will improve 08/31/2020 2340 by Ballard Russell, RN Outcome: Progressing 08/31/2020 2339 by Ballard Russell, RN Outcome: Progressing   Problem: Clinical Measurements: Goal: Ability to maintain clinical measurements within normal limits will improve 08/31/2020 2340 by Ballard Russell, RN Outcome: Progressing 08/31/2020 2339 by Ballard Russell, RN Outcome: Progressing Goal: Will remain free from infection 08/31/2020 2340 by Ballard Russell, RN Outcome: Progressing 08/31/2020 2339 by Ballard Russell, RN Outcome: Progressing Goal: Diagnostic test results will improve 08/31/2020 2340 by Ballard Russell, RN Outcome: Progressing 08/31/2020 2339 by Ballard Russell, RN Outcome: Progressing Goal: Respiratory complications will improve 08/31/2020 2340 by Ballard Russell, RN Outcome: Progressing 08/31/2020 2339 by Ballard Russell, RN Outcome: Progressing Goal: Cardiovascular complication will be avoided 08/31/2020 2340 by Ballard Russell, RN Outcome: Progressing 08/31/2020 2339 by Ballard Russell, RN Outcome: Progressing   Problem: Activity: Goal: Risk for activity intolerance will decrease 08/31/2020 2340 by Ballard Russell, RN Outcome: Progressing 08/31/2020 2339 by Ballard Russell, RN Outcome: Progressing   Problem: Nutrition: Goal: Adequate nutrition will be maintained 08/31/2020 2340 by Ballard Russell, RN Outcome: Progressing 08/31/2020 2339 by Ballard Russell, RN Outcome: Progressing   Problem: Coping: Goal: Level of anxiety will  decrease 08/31/2020 2340 by Ballard Russell, RN Outcome: Progressing 08/31/2020 2339 by Ballard Russell, RN Outcome: Progressing   Problem: Elimination: Goal: Will not experience complications related to bowel motility 08/31/2020 2340 by Ballard Russell, RN Outcome: Progressing 08/31/2020 2339 by Ballard Russell, RN Outcome: Progressing Goal: Will not experience complications related to urinary retention 08/31/2020 2340 by Ballard Russell, RN Outcome: Progressing 08/31/2020 2339 by Ballard Russell, RN Outcome: Progressing   Problem: Pain Managment: Goal: General experience of comfort will improve 08/31/2020 2340 by Ballard Russell, RN Outcome: Progressing 08/31/2020 2339 by Ballard Russell, RN Outcome: Progressing   Problem: Safety: Goal: Ability to remain free from injury will improve 08/31/2020 2340 by Ballard Russell, RN Outcome: Progressing 08/31/2020 2339 by Ballard Russell, RN Outcome: Progressing   Problem: Skin Integrity: Goal: Risk for impaired skin integrity will decrease 08/31/2020 2340 by Ballard Russell, RN Outcome: Progressing 08/31/2020 2339 by Ballard Russell, RN Outcome: Progressing

## 2020-08-31 NOTE — Plan of Care (Signed)

## 2020-08-31 NOTE — Progress Notes (Signed)
Physical Therapy Treatment Patient Details Name: [name] MRN: [id number] DOB: 1926-12-04 Today's Date: 08/31/2020    History of Present Illness Per MD notes: Pt is a 86 year old female with a history of hypertension and reflux who presents to the hospital with a 2-week history of cough, congestion.  MD assessment includes: Sepsis due to pneumonia, RLL PNA, acute respiratory failure, rapid A-fib, acute renal failure, GERD, and generalized weakness.    PT Comments    Pt was pleasant and motivated to participate during the session and made good progress towards goals.  Pt was Ind with bed mob tasks without use of the bed rail.  Pt was steady with transfers with good eccentric and concentric control.  Pt ambulated with slow cadence but was steady without LOB with HR mostly in the 80s with one brief high of 93 bpm; SpO2 on room air remained in low 90s with occasional drop to 87-88% that quickly increased with cues for deeper breaths/PLB.  Pt will benefit from HHPT services upon discharge to safely address deficits listed in patient problem list for decreased caregiver assistance and eventual return to PLOF.     Follow Up Recommendations  Home health PT;Supervision for mobility/OOB     Equipment Recommendations  None recommended by PT    Recommendations for Other Services       Precautions / Restrictions Precautions Precautions: Fall Restrictions Weight Bearing Restrictions: No Other Position/Activity Restrictions: Per cardiology note: HR goal with ambulation 60-90 bpm    Mobility  Bed Mobility Overal bed mobility: Independent             General bed mobility comments: No use of bed rail required during sup to/from sit  Transfers Overall transfer level: Needs assistance Equipment used: Rolling walker (2 wheeled) Transfers: Sit to/from Stand Sit to Stand: Supervision         General transfer comment: Good eccentric and concentric control and  stability  Ambulation/Gait Ambulation/Gait assistance: Supervision Gait Distance (Feet): 100 Feet Assistive device: Rolling walker (2 wheeled) Gait Pattern/deviations: Step-through pattern;Decreased step length - right;Decreased step length - left Gait velocity: decreased   General Gait Details: Slow cadence but steady without LOB with HR mostly in the 80s with one brief high of 93 bpm; SpO2 on room air in low 90s with occasional drop to 87-88% that quickly increased with cues for deeper breaths   Stairs             Wheelchair Mobility    Modified Rankin (Stroke Patients Only)       Balance Overall balance assessment: No apparent balance deficits (not formally assessed)                                          Cognition Arousal/Alertness: Awake/alert Behavior During Therapy: WFL for tasks assessed/performed Overall Cognitive Status: Within Functional Limits for tasks assessed                                        Exercises Total Joint Exercises Ankle Circles/Pumps: Strengthening;Both;10 reps Quad Sets: Strengthening;Both;10 reps Gluteal Sets: Strengthening;Both;10 reps Hip ABduction/ADduction: Strengthening;Both;10 reps Long Arc Quad: Strengthening;Both;10 reps Knee Flexion: Strengthening;Both;10 reps Other Exercises Other Exercises: HEP education/review for BLE APs, QS, and GS x 10 each every 1-2 hours daily Other Exercises: Education for PLB PRN  General Comments        Pertinent Vitals/Pain Pain Assessment: No/denies pain    Home Living                      Prior Function            PT Goals (current goals can now be found in the care plan section) Progress towards PT goals: Progressing toward goals    Frequency    Min 2X/week      PT Plan Current plan remains appropriate    Co-evaluation              AM-PAC PT "6 Clicks" Mobility   Outcome Measure  Help needed turning from your  back to your side while in a flat bed without using bedrails?: None Help needed moving from lying on your back to sitting on the side of a flat bed without using bedrails?: None Help needed moving to and from a bed to a chair (including a wheelchair)?: A Little Help needed standing up from a chair using your arms (e.g., wheelchair or bedside chair)?: A Little Help needed to walk in hospital room?: A Little Help needed climbing 3-5 steps with a railing? : A Little 6 Click Score: 20    End of Session Equipment Utilized During Treatment: Gait belt Activity Tolerance: Patient tolerated treatment well Patient left: in bed;with call bell/phone within reach;with bed alarm set Nurse Communication: Mobility status;Other (comment) (HR and SpO2 response to activity) PT Visit Diagnosis: Muscle weakness (generalized) (M62.81);Difficulty in walking, not elsewhere classified (R26.2)     Time: 1631-1700 PT Time Calculation (min) (ACUTE ONLY): 29 min  Charges:  $Gait Training: 8-22 mins $Therapeutic Exercise: 8-22 mins                     D. Scott Farhana Fellows PT, DPT 08/31/20, 5:17 PM

## 2020-08-31 NOTE — Progress Notes (Signed)
PROGRESS NOTE    [name]  RDE:[id number] DOB: 1927-05-26 DOA: 08/27/2020 PCP: Rusty Aus, MD   Brief Narrative: Taken from prior notes. 86 year old female with a history of hypertension, reflux who presents to the hospital ER from the PCP office.  Patient's had a 2-week history of cough, congestion.  Patient states that she normally gets an upper respiratory infection in November.  Patient has been on Levaquin and prednisone.  This was changed to Ceftin and prednisone.  She followed up with her PCP in the office today.  She was noted to be hypoxic with room air saturations in the mid 80s.  She was also tachycardic.  She was sent to the ER for evaluation. Also found to have new onset A. fib with RVR.  CTA negative for PE, she had 8/10 right lower lobe infiltrate.  Admitted for sepsis secondary to pneumonia with failed outpatient therapy.  Subjective: Patient was complaining of headache when seen today.  Overall not feeling well, stating she does not think that she can go back home and live by herself as she lives alone.  Assessment & Plan:   Principal Problem:   RLL pneumonia Active Problems:   GERD (gastroesophageal reflux disease)   HTN (hypertension), benign   Acute respiratory failure with hypoxia (HCC)   Hyponatremia   Sepsis due to pneumonia (HCC)   Atrial fibrillation with RVR (HCC)   DNR (do not resuscitate)/DNI(Do Not Intubate)   Hypoxia  Sepsis secondary to right lower lobe pneumonia.  Failed outpatient treatment.  CTA with no PE but did shows multifocal pneumonia predominantly involving right lower lobe.  Continues to have cough which increased with deep breathing.  Initially received cefepime and vancomycin, vancomycin was discontinued as MRSA PCR was negative.  Strep pneumo antigen negative.  Procalcitonin negative -Completed the course of cefepime -Continue doxycycline to cover atypical. -Continue with supportive care.  A. fib with RVR.  Patient was on IV  Cardizem.  Cardiology was consulted-appreciate their recommendations, IV Cardizem was converted with p.o. rate well controlled today. -Continue Cardizem. -Continue metoprolol -Continue with Eliquis  AKI.  Resolved  GERD. -Continue with PPI.  Hypertension.  Blood pressure mildly elevated. -Continue holding home Lasix as she still appears dry. -Continue with Cardizem.  Generalized weakness.  PT recommending home health services, which were ordered.  We will ask PT to reevaluate him as he was feeling more weaker today. Patient lives alone and had an elderly sister who occasionally visits her. Might get benefit going to rehab before returning home.  Objective: Vitals:   08/31/20 0400 08/31/20 0815 08/31/20 1130 08/31/20 1606  BP:  (!) 143/76 (!) 157/83 (!) 146/81  Pulse: 64 73 89 69  Resp:  (!) 23 (!) 23 (!) 22  Temp: 97.6 F (36.4 C) 97.8 F (36.6 C) 98.4 F (36.9 C) 98.2 F (36.8 C)  TempSrc: Oral Oral Oral Oral  SpO2: 95% 97% 91% 96%  Weight:      Height:        Intake/Output Summary (Last 24 hours) at 08/31/2020 1657 Last data filed at 08/31/2020 1413 Gross per 24 hour  Intake 100 ml  Output 1750 ml  Net -1650 ml   Filed Weights   08/28/20 0431 08/29/20 0456 08/30/20 0406  Weight: 71.1 kg 70.9 kg 69 kg    Examination:  General.  Frail, lethargic looking elderly lady, in no acute distress. Pulmonary.  Few basal rhonchi, normal respiratory effort. CV.  Regular rate and rhythm, no JVD, rub or  murmur. Abdomen.  Soft, nontender, nondistended, BS positive. CNS.  Alert and oriented x3.  No focal neurologic deficit. Extremities.  No edema, no cyanosis, pulses intact and symmetrical. Psychiatry.  Judgment and insight appears normal.  DVT prophylaxis: Lovenox Code Status: DNR Family Communication: Discussed with patient, called sister with no response. Disposition Plan:  Status is: Inpatient  Remains inpatient appropriate because:Inpatient level of care appropriate  due to severity of illness   Dispo: The patient is from: Home              Anticipated d/c is to: Home              Anticipated d/c date is: 1 day              Patient currently is not medically stable to d/c.  Patient is high risk for deterioration and death.  Might get benefit going to SNF before returning home as she lives alone.   Consultants:   Cardiology  Procedures:  Antimicrobials:  Doxycycline  Data Reviewed: I have personally reviewed following labs and imaging studies  CBC: Recent Labs  Lab 08/27/20 1501 08/27/20 1510 08/28/20 0503  WBC 25.7* 25.2* 18.6*  NEUTROABS  --  22.4* 14.3*  HGB 15.8* 15.5* 12.4  HCT 46.9* 46.0 37.1  MCV 88.0 89.1 89.2  PLT 442* 443* 101   Basic Metabolic Panel: Recent Labs  Lab 08/27/20 1501 08/27/20 1507 08/28/20 0503  NA 127*  --  133*  K 4.3  --  3.3*  CL 90*  --  102  CO2 24  --  21*  GLUCOSE 131*  --  111*  BUN 28*  --  20  CREATININE 1.19*  --  0.95  CALCIUM 9.9  --  8.7*  MG  --  1.7  --    GFR: Estimated Creatinine Clearance: 35.4 mL/min (by C-G formula based on SCr of 0.95 mg/dL). Liver Function Tests: Recent Labs  Lab 08/27/20 2036 08/28/20 0503  AST 21 19  ALT 16 16  ALKPHOS 50 53  BILITOT 0.6 0.8  PROT 5.7* 5.4*  ALBUMIN 2.7* 2.6*   No results for input(s): LIPASE, AMYLASE in the last 168 hours. No results for input(s): AMMONIA in the last 168 hours. Coagulation Profile: Recent Labs  Lab 08/27/20 2036  INR 1.2   Cardiac Enzymes: No results for input(s): CKTOTAL, CKMB, CKMBINDEX, TROPONINI in the last 168 hours. BNP (last 3 results) No results for input(s): PROBNP in the last 8760 hours. HbA1C: No results for input(s): HGBA1C in the last 72 hours. CBG: No results for input(s): GLUCAP in the last 168 hours. Lipid Profile: No results for input(s): CHOL, HDL, LDLCALC, TRIG, CHOLHDL, LDLDIRECT in the last 72 hours. Thyroid Function Tests: No results for input(s): TSH, T4TOTAL, FREET4, T3FREE,  THYROIDAB in the last 72 hours. Anemia Panel: No results for input(s): VITAMINB12, FOLATE, FERRITIN, TIBC, IRON, RETICCTPCT in the last 72 hours. Sepsis Labs: Recent Labs  Lab 08/27/20 1708 08/27/20 2036 08/27/20 2238  PROCALCITON <0.10  --   --   LATICACIDVEN  --  1.5 1.8    Recent Results (from the past 240 hour(s))  Resp Panel by RT-PCR (Flu A&B, Covid) Nasopharyngeal Swab     Status: None   Collection Time: 08/27/20  3:02 PM   Specimen: Nasopharyngeal Swab; Nasopharyngeal(NP) swabs in vial transport medium  Result Value Ref Range Status   SARS Coronavirus 2 by RT PCR NEGATIVE NEGATIVE Final    Comment: (NOTE) SARS-CoV-2 target nucleic  acids are NOT DETECTED.  The SARS-CoV-2 RNA is generally detectable in upper respiratory specimens during the acute phase of infection. The lowest concentration of SARS-CoV-2 viral copies this assay can detect is 138 copies/mL. A negative result does not preclude SARS-Cov-2 infection and should not be used as the sole basis for treatment or other patient management decisions. A negative result may occur with  improper specimen collection/handling, submission of specimen other than nasopharyngeal swab, presence of viral mutation(s) within the areas targeted by this assay, and inadequate number of viral copies(<138 copies/mL). A negative result must be combined with clinical observations, patient history, and epidemiological information. The expected result is Negative.  Fact Sheet for Patients:  EntrepreneurPulse.com.au  Fact Sheet for Healthcare Providers:  IncredibleEmployment.be  This test is no t yet approved or cleared by the [place] FDA and  has been authorized for detection and/or diagnosis of SARS-CoV-2 by FDA under an Emergency Use Authorization (EUA). This EUA will remain  in effect (meaning this test can be used) for the duration of the COVID-19 declaration under Section 564(b)(1) of the  Act, 21 U.S.C.section 360bbb-3(b)(1), unless the authorization is terminated  or revoked sooner.       Influenza A by PCR NEGATIVE NEGATIVE Final   Influenza B by PCR NEGATIVE NEGATIVE Final    Comment: (NOTE) The Xpert Xpress SARS-CoV-2/FLU/RSV plus assay is intended as an aid in the diagnosis of influenza from Nasopharyngeal swab specimens and should not be used as a sole basis for treatment. Nasal washings and aspirates are unacceptable for Xpert Xpress SARS-CoV-2/FLU/RSV testing.  Fact Sheet for Patients: EntrepreneurPulse.com.au  Fact Sheet for Healthcare Providers: IncredibleEmployment.be  This test is not yet approved or cleared by the [place] FDA and has been authorized for detection and/or diagnosis of SARS-CoV-2 by FDA under an Emergency Use Authorization (EUA). This EUA will remain in effect (meaning this test can be used) for the duration of the COVID-19 declaration under Section 564(b)(1) of the Act, 21 U.S.C. section 360bbb-3(b)(1), unless the authorization is terminated or revoked.  Performed at [facility], [address] [place], [place] [address]   Blood culture (routine single)     Status: None (Preliminary result)   Collection Time: 08/27/20  5:50 PM   Specimen: BLOOD  Result Value Ref Range Status   Specimen Description BLOOD BLOOD RIGHT ARM  Final   Special Requests   Final    BOTTLES DRAWN AEROBIC AND ANAEROBIC Blood Culture adequate volume   Culture   Final    NO GROWTH 4 DAYS Performed at [facility], [address] [place], [place] [address]    Report Status PENDING  Incomplete  MRSA PCR Screening     Status: None   Collection Time: 08/28/20 12:06 PM   Specimen: Nasopharyngeal  Result Value Ref Range Status   MRSA by PCR NEGATIVE NEGATIVE Final    Comment:        The GeneXpert MRSA Assay (FDA approved for NASAL specimens only), is one component of a comprehensive MRSA  colonization surveillance program. It is not intended to diagnose MRSA infection nor to guide or monitor treatment for MRSA infections. Performed at [facility], [address], [place], [place] [address]   Urine Culture     Status: None   Collection Time: 08/29/20  5:00 AM   Specimen: In/Out Cath Urine  Result Value Ref Range Status   Specimen Description   Final    IN/OUT CATH URINE Performed at [facility], [address], [place], [place] [address]    Special Requests   Final    NONE Performed at [facility], [address] [place], [place] [address]    Culture   Final    NO GROWTH Performed at [facility], [place] [address], [place],  [address]    Report Status 08/30/2020 FINAL  Final     Radiology Studies: No results found.  Scheduled Meds: . apixaban  5 mg Oral BID  . calcium-vitamin D  2 tablet Oral Daily  . diltiazem  240 mg Oral Daily  . doxycycline  100 mg Oral Q12H  . escitalopram  5 mg Oral Daily  . fluticasone  1 spray Each Nare Daily  . metoprolol tartrate  25 mg Oral BID  . nystatin  5 mL Mouth/Throat QID  . pantoprazole  40 mg Oral Daily  . polyethylene glycol  17 g Oral Daily  . sodium chloride flush  3 mL Intravenous Q12H   Continuous Infusions: . sodium chloride 10 mL/hr at 08/30/20 0400  . ceFEPime (MAXIPIME) IV 2 g (08/31/20 1107)     LOS: 4 days   Time spent: 30 minutes.  Lorella Nimrod, MD [facility]  If 7PM-7AM, please contact night-coverage [email or web address]  08/31/2020, 4:57 PM   This record has been created using Systems analyst. Errors have been sought and corrected,but may not always be located. Such creation errors do not reflect on the standard of care.

## 2020-08-31 NOTE — Progress Notes (Signed)
[facility] Encounter Note  Patient: [name] / Admit Date: 08/27/2020 / Date of Encounter: 08/31/2020, 6:57 AM   Subjective: 12/1 patient overall feeling much better since admission.  No evidence of significant cough or congestion overnight.  Patient has not had any significant hypotension or rapid heart rate his current medical regimen for atrial fibrillation heart rate control.  Troponin still minimal most consistent with demand ischemia rather than acute coronary syndrome.  No heart failure symptoms.  Talk with the patient with long discussion about anticoagulation and the patient currently has no evidence of significant bleeding concerns and will be appropriate for anticoagulation for atrial fibrillation.  Heart rate control at rest reasonable but likely will need additional medication management  12/2 patient did have some cough and congestion last night but overall appears that the patient is still quite comfortable from shortness of breath standpoint.  Patient is slowly improving with appropriate pulmonary and right lower lobe pneumonia treatment.  Patient has had continued atrial fibrillation with controlled ventricular rate but better heart rate control with addition of metoprolol.  Currently resting heart rate is 60 bpm.  She has had addition of Eliquis which she has tolerated well and there is no significant side effects at this time.  12/3 patient has done very well since admission and now has less cough and congestion.  She is breathing much better and does not have to use oxygenation now.  She has had much better heart rate control since improvements of oxygenation and treatment of pneumonia.  Heart rate controlled by telemetry shows to be 60 to 70 bpm but still atrial fibrillation.  Patient has tolerated her anticoagulation without evidence of issues  Echocardiogram showing normal LV systolic function with ejection fraction of 60% with mild aortic valve  sclerosis but no evidence of other significant valvular heart disease  Review of Systems: Positive for: Shortness of breath Negative for: Vision change, hearing change, syncope, dizziness, nausea, vomiting,diarrhea, bloody stool, stomach pain, cough, congestion, diaphoresis, urinary frequency, urinary pain,skin lesions, skin rashes Others previously listed  Objective: Telemetry: Atrial fibrillation with controlled ventricular rate Physical Exam: Blood pressure 134/60, pulse 64, temperature 97.6 F (36.4 C), temperature source Oral, resp. rate (!) 21, height 5' 5.5" (1.664 m), weight 69 kg, SpO2 95 %. Body mass index is 24.94 kg/m. General: Well developed, well nourished, in no acute distress. Head: Normocephalic, atraumatic, sclera non-icteric, no xanthomas, nares are without discharge. Neck: No apparent masses Lungs: Normal respirations with some wheezes, diffuse rhonchi, no rales , no crackles   Heart: Irregular rate and rhythm, normal S1 S2, no murmur, no rub, no gallop, PMI is normal size and placement, carotid upstroke normal without bruit, jugular venous pressure normal Abdomen: Soft, non-tender, non-distended with normoactive bowel sounds. No hepatosplenomegaly. Abdominal aorta is normal size without bruit Extremities: No edema, no clubbing, no cyanosis, no ulcers,  Peripheral: 2+ radial, 2+ femoral, 2+ dorsal pedal pulses Neuro: Alert and oriented. Moves all extremities spontaneously. Psych:  Responds to questions appropriately with a normal affect.   Intake/Output Summary (Last 24 hours) at 08/31/2020 0657 Last data filed at 08/31/2020 0400 Gross per 24 hour  Intake 340.49 ml  Output 1800 ml  Net -1459.51 ml    Inpatient Medications:   apixaban  5 mg Oral BID   calcium-vitamin D  2 tablet Oral Daily   diltiazem  240 mg Oral Daily   doxycycline  100 mg Oral Q12H   escitalopram  5 mg Oral Daily  metoprolol tartrate  25 mg Oral BID   nystatin  5 mL Mouth/Throat QID    pantoprazole  40 mg Oral Daily   polyethylene glycol  17 g Oral Daily   sodium chloride flush  3 mL Intravenous Q12H   Infusions:   sodium chloride 10 mL/hr at 08/30/20 0400   ceFEPime (MAXIPIME) IV 2 g (08/30/20 2124)    Labs: No results for input(s): NA, K, CL, CO2, GLUCOSE, BUN, CREATININE, CALCIUM, MG, PHOS in the last 72 hours. No results for input(s): AST, ALT, ALKPHOS, BILITOT, PROT, ALBUMIN in the last 72 hours. No results for input(s): WBC, NEUTROABS, HGB, HCT, MCV, PLT in the last 72 hours. No results for input(s): CKTOTAL, CKMB, TROPONINI in the last 72 hours. Invalid input(s): POCBNP No results for input(s): HGBA1C in the last 72 hours.   Weights: Filed Weights   08/28/20 0431 08/29/20 0456 08/30/20 0406  Weight: 71.1 kg 70.9 kg 69 kg     Radiology/Studies:  CT Angio Chest PE W and/or Wo Contrast  Result Date: 08/27/2020 CLINICAL DATA:  86 year old female with concern for pulmonary embolism. EXAM: CT ANGIOGRAPHY CHEST WITH CONTRAST TECHNIQUE: Multidetector CT imaging of the chest was performed using the standard protocol during bolus administration of intravenous contrast. Multiplanar CT image reconstructions and MIPs were obtained to evaluate the vascular anatomy. CONTRAST:  46mL OMNIPAQUE IOHEXOL 350 MG/ML SOLN COMPARISON:  Chest CT dated 03/05/2009 and radiograph dated 08/27/2020. FINDINGS: Cardiovascular: There is no cardiomegaly or pericardial effusion. There is coronary vascular calcification. Mild atherosclerotic calcification of the thoracic aorta. No aneurysmal dilatation. No pulmonary artery embolus identified. Mediastinum/Nodes: Right hilar adenopathy measures 15 mm. Subcarinal lymph node measures 15 mm short axis. There is a small hiatal hernia. The esophagus is grossly unremarkable. No mediastinal fluid collection. Lungs/Pleura: Mild bilateral upper lobe bronchiectasis. There are clusters of nodular and streaky densities in the right upper lobe with areas  of scarring. Findings consistent with acute pneumonia superimposed on background of chronic changes. Focal area of scarring noted in the lingula. There is an area of consolidative change at the right lung base. There is diffuse nodular densities throughout the right lung consistent with infection, possibly atypical in etiology or aspiration. Clinical correlation is recommended. There is peribronchial thickening primarily in the right lower lobe. No pleural effusion pneumothorax. The central airways are patent. Upper Abdomen: None Musculoskeletal: Degenerative changes of the spine. No acute osseous pathology. Review of the MIP images confirms the above findings. IMPRESSION: 1. No CT evidence of pulmonary embolism. 2. Multifocal pneumonia predominantly involving the right lower lobe, possibly atypical in etiology or aspiration. Clinical correlation and follow-up to resolution recommended. 3. Aortic Atherosclerosis (ICD10-I70.0). Electronically Signed   By: Anner Crete M.D.   On: 08/27/2020 16:30   DG Chest Portable 1 View  Result Date: 08/27/2020 CLINICAL DATA:  Shortness of breath, tachycardia EXAM: PORTABLE CHEST 1 VIEW COMPARISON:  Radiograph 06/30/2018, CT 03/05/2009 FINDINGS: Chronic hyperinflation and coarsened interstitial changes are again seen albeit with new areas of more patchy and consolidative opacity in the right suprahilar lung and right lung base with associated airways thickening. Left lung remains relatively clear. No pneumothorax. Blunting of the right costophrenic sulcus could reflect scarring or trace effusion. Degenerative changes are present in the imaged spine and shoulders. Telemetry leads overlie the chest. IMPRESSION: 1. Areas of patchy and consolidative opacity in the right suprahilar lung and right lung base with associated airways thickening, concerning for pneumonia or sequela aspiration given history of dysphagia and  esophageal spasm. 2. Chronic hyperinflation and coarsened  interstitial changes. 3. Possible trace right pleural effusion or scarring. Electronically Signed   By: Lovena Le M.D.   On: 08/27/2020 15:28   ECHOCARDIOGRAM COMPLETE  Result Date: 08/29/2020    ECHOCARDIOGRAM REPORT   Patient Name:   [facility] Date of Exam: 08/28/2020 Medical Rec #:  [id number]  Height:       65.5 in Accession #:    [id number] Weight:       156.8 lb Date of Birth:  27-Sep-1927  BSA:          1.794 m Patient Age:    86 years   BP:           155/59 mmHg Patient Gender: F          HR:           90 bpm. Exam Location:  [facility] Procedure: 2D Echo, Cardiac Doppler and Color Doppler Indications:     I48.91 Atrial Fibrillation  History:         Patient has no prior history of Echocardiogram examinations.                  Risk Factors:Hypertension. Edema.  Sonographer:     Wilford Sports Rodgers-Jones Referring Phys:  [id number] ERIC CHEN Diagnosing Phys: Kate Sable MD IMPRESSIONS  1. Left ventricular ejection fraction, by estimation, is 60 to 65%. The left ventricle has normal function. The left ventricle has no regional wall motion abnormalities. There is mild left ventricular hypertrophy. Left ventricular diastolic parameters are indeterminate.  2. Right ventricular systolic function is normal. The right ventricular size is normal. There is normal pulmonary artery systolic pressure.  3. The mitral valve is normal in structure. No evidence of mitral valve regurgitation.  4. The aortic valve is tricuspid. Aortic valve regurgitation is not visualized. Mild aortic valve sclerosis is present, with no evidence of aortic valve stenosis.  5. The inferior vena cava is normal in size with greater than 50% respiratory variability, suggesting right atrial pressure of 3 mmHg. FINDINGS  Left Ventricle: Left ventricular ejection fraction, by estimation, is 60 to 65%. The left ventricle has normal function. The left ventricle has no regional wall motion abnormalities. The left ventricular internal cavity size was normal  in size. There is  mild left ventricular hypertrophy. Left ventricular diastolic parameters are indeterminate. Right Ventricle: The right ventricular size is normal. No increase in right ventricular wall thickness. Right ventricular systolic function is normal. There is normal pulmonary artery systolic pressure. The tricuspid regurgitant velocity is 2.43 m/s, and  with an assumed right atrial pressure of 3 mmHg, the estimated right ventricular systolic pressure is 56.8 mmHg. Left Atrium: Left atrial size was normal in size. Right Atrium: Right atrial size was normal in size. Pericardium: There is no evidence of pericardial effusion. Mitral Valve: The mitral valve is normal in structure. No evidence of mitral valve regurgitation. Tricuspid Valve: The tricuspid valve is normal in structure. Tricuspid valve regurgitation is trivial. Aortic Valve: The aortic valve is tricuspid. Aortic valve regurgitation is not visualized. Mild aortic valve sclerosis is present, with no evidence of aortic valve stenosis. Pulmonic Valve: The pulmonic valve was not well visualized. Pulmonic valve regurgitation is not visualized. Aorta: The aortic root and ascending aorta are structurally normal, with no evidence of dilitation. Venous: The inferior vena cava is normal in size with greater than 50% respiratory variability, suggesting right atrial pressure of 3 mmHg. IAS/Shunts: No atrial level shunt  detected by color flow Doppler.  LEFT VENTRICLE PLAX 2D LVIDd:         3.46 cm LVIDs:         2.44 cm LV PW:         1.21 cm LV IVS:        1.45 cm LVOT diam:     1.90 cm LV SV:         58 LV SV Index:   32 LVOT Area:     2.84 cm  RIGHT VENTRICLE             IVC RV Basal diam:  3.77 cm     IVC diam: 1.37 cm RV S prime:     14.77 cm/s TAPSE (M-mode): 1.5 cm LEFT ATRIUM             Index       RIGHT ATRIUM           Index LA diam:        3.80 cm 2.12 cm/m  RA Area:     13.30 cm LA Vol (A2C):   45.5 ml 25.36 ml/m RA Volume:   33.50 ml  18.67  ml/m LA Vol (A4C):   27.6 ml 15.39 ml/m LA Biplane Vol: 36.2 ml 20.18 ml/m  AORTIC VALVE LVOT Vmax:   130.33 cm/s LVOT Vmean:  91.467 cm/s LVOT VTI:    0.203 m  AORTA Ao Root diam: 3.20 cm Ao Asc diam:  3.40 cm MV E velocity: 113.75 cm/s  TRICUSPID VALVE                             TR Peak grad:   23.6 mmHg                             TR Vmax:        243.00 cm/s                              SHUNTS                             Systemic VTI:  0.20 m                             Systemic Diam: 1.90 cm Kate Sable MD Electronically signed by Kate Sable MD Signature Date/Time: 08/29/2020/12:22:07 PM    Final      Assessment and Recommendation  86 y.o. female with borderline hypertension having right lung pneumonia with new onset atrial fibrillation with rapid ventricular rate now with better heart rate control on diltiazem without evidence of congestive heart failure acute coronary syndrome and/or myocardial infarction 1.  Continue heart rate control with oral diltiazem at 240 mg CD each day and 25 mg of metoprolol for a goal heart rate with ambulation between 60 and 90 bpm.  We recommend continuation of these medications as an outpatient at current dosages 2.  Continuation of Eliquis at 5 mg twice per day for further risk reduction stroke with atrial fibrillation.  Will follow for any bleeding or bruising complications.  Would continue this dosage of Eliquis as an outpatient 3.  No further cardiac diagnostics necessary at this time due to no evidence of angina or congestive heart  failure 4.  Continue supportive care of pneumonia hypoxia and with pulmonary toilet 5.  When ambulating well with good heart rate control and improved from pulmonary standpoint will be able to potentially discharge to home from the cardiac standpoint. 6.  Call if further questions.  If there are any further questions Dr. Clayborn Bigness will be available to answer those questions  Signed, Serafina Royals M.D. FACC

## 2020-08-31 NOTE — TOC Progression Note (Signed)
Transition of Care [facility]) - Progression Note    Patient Details  Name: [name] MRN: [id number] Date of Birth: 08/17/1927  Transition of Care [facility]) CM/SW [place], RN Phone Number: 08/31/2020, 12:12 PM  Clinical Narrative:   Valora Corporal from [facility] and scheduled services for discharge to home for health care Management, PT/OT/RN.    Expected Discharge Plan: Home/Self Care Barriers to Discharge: Continued Medical Work up  Expected Discharge Plan and Services Expected Discharge Plan: Home/Self Care In-house Referral: NA Discharge Planning Services: NA Post Acute Care Choice: NA Living arrangements for the past 2 months: Single Family Home                 DME Arranged: N/A DME Agency: NA       HH Arranged: NA HH Agency: NA         Social Determinants of Health (SDOH) Interventions    Readmission Risk Interventions No flowsheet data found.

## 2020-09-01 DIAGNOSIS — I4891 Unspecified atrial fibrillation: Secondary | ICD-10-CM | POA: Diagnosis not present

## 2020-09-01 DIAGNOSIS — J9601 Acute respiratory failure with hypoxia: Secondary | ICD-10-CM | POA: Diagnosis not present

## 2020-09-01 DIAGNOSIS — J189 Pneumonia, unspecified organism: Secondary | ICD-10-CM | POA: Diagnosis not present

## 2020-09-01 DIAGNOSIS — K219 Gastro-esophageal reflux disease without esophagitis: Secondary | ICD-10-CM | POA: Diagnosis not present

## 2020-09-01 LAB — BASIC METABOLIC PANEL
Anion gap: 8 (ref 5–15)
BUN: 18 mg/dL (ref 8–23)
CO2: 26 mmol/L (ref 22–32)
Calcium: 9.3 mg/dL (ref 8.9–10.3)
Chloride: 101 mmol/L (ref 98–111)
Creatinine, Ser: 0.74 mg/dL (ref 0.44–1.00)
GFR, Estimated: >60 mL/min (ref >60)
Glucose, Bld: 113 mg/dL — ABNORMAL HIGH (ref 70–99)
Potassium: 4.1 mmol/L (ref 3.5–5.1)
Sodium: 135 mmol/L (ref 135–145)

## 2020-09-01 LAB — CULTURE, BLOOD (SINGLE)
Culture: NO GROWTH
Special Requests: ADEQUATE

## 2020-09-01 NOTE — Progress Notes (Signed)
PROGRESS NOTE    [name]  YBO:[id number] DOB: 1926/10/18 DOA: 08/27/2020 PCP: Rusty Aus, MD   Brief Narrative: Taken from prior notes. 86 year old female with a history of hypertension, reflux who presents to the hospital ER from the PCP office.  Patient's had a 2-week history of cough, congestion.  Patient states that she normally gets an upper respiratory infection in November.  Patient has been on Levaquin and prednisone.  This was changed to Ceftin and prednisone.  She followed up with her PCP in the office today.  She was noted to be hypoxic with room air saturations in the mid 80s.  She was also tachycardic.  She was sent to the ER for evaluation. Also found to have new onset A. fib with RVR.  CTA negative for PE, she had 8/10 right lower lobe infiltrate.  Admitted for sepsis secondary to pneumonia with failed outpatient therapy.  Subjective: Patient was not feeling well when seen today.  Stating that she still feel weak and cannot take care of herself at home.  She lives alone.  Unable to reach her sister who is listed in her chart.  Per patient she wants her niece to be her POA but they have not completed any documentation.  Unable to provide me her number.  Assessment & Plan:   Principal Problem:   RLL pneumonia Active Problems:   GERD (gastroesophageal reflux disease)   HTN (hypertension), benign   Acute respiratory failure with hypoxia (HCC)   Hyponatremia   Sepsis due to pneumonia (HCC)   Atrial fibrillation with RVR (HCC)   DNR (do not resuscitate)/DNI(Do Not Intubate)   Hypoxia  Sepsis secondary to right lower lobe pneumonia.  Failed outpatient treatment.  CTA with no PE but did shows multifocal pneumonia predominantly involving right lower lobe.  Continues to have cough which increased with deep breathing, started showing some improvement.  Initially received cefepime and vancomycin, vancomycin was discontinued as MRSA PCR was negative.  Strep pneumo antigen  negative.  Procalcitonin negative -Completed the course of cefepime -Complete the course of doxycycline today. -Continue with supportive care.  A. fib with RVR.  Cardiology was consulted-appreciate their recommendations, IV Cardizem was converted with p.o. rate well controlled now. -Continue Cardizem. -Continue metoprolol -Continue with Eliquis  AKI.  Resolved  GERD. -Continue with PPI.  Hypertension.  Blood pressure mildly elevated. -Continue holding home Lasix as she still appears dry. -Continue with Cardizem.  Generalized weakness.  PT recommending home health services, which were ordered.  Patient wants to go to SNF before returning home, stating that she cannot take care of herself with current health and she lives alone.  Unable to reach any family member.  Reevaluation with PT was done yesterday and she does not meet the criteria for SNF. -Ask TOC to help with disposition.  Objective: Vitals:   09/01/20 0500 09/01/20 0533 09/01/20 0846 09/01/20 1153  BP: (!) 121/52  138/89 (!) 122/52  Pulse: 64  72 69  Resp: 18  20 20   Temp: 98.1 F (36.7 C)  98 F (36.7 C) 98.6 F (37 C)  TempSrc: Oral  Oral Oral  SpO2: 91%  97% 93%  Weight: 70.1 kg 71 kg    Height:        Intake/Output Summary (Last 24 hours) at 09/01/2020 1451 Last data filed at 09/01/2020 0500 Gross per 24 hour  Intake 3 ml  Output 500 ml  Net -497 ml   Filed Weights   08/30/20 0406 09/01/20 0500  09/01/20 0533  Weight: 69 kg 70.1 kg 71 kg    Examination:  General.  Frail elderly lady, in no acute distress. Pulmonary.  Lungs clear bilaterally, normal respiratory effort. CV.  Regular rate and rhythm, no JVD, rub or murmur. Abdomen.  Soft, nontender, nondistended, BS positive. CNS.  Alert and oriented x3.  No focal neurologic deficit. Extremities.  No edema, no cyanosis, pulses intact and symmetrical. Psychiatry.  Judgment and insight appears normal.  DVT prophylaxis: Lovenox Code Status: DNR Family  Communication: Discussed with patient, called sister again with no response. Disposition Plan:  Status is: Inpatient  Remains inpatient appropriate because:Inpatient level of care appropriate due to severity of illness   Dispo: The patient is from: Home              Anticipated d/c is to: Home              Anticipated d/c date is: 1 day              Patient currently is medically stable but unsafe discharge. Patient is high risk for deterioration and death.  Might get benefit going to SNF before returning home as she lives alone.  Unsafe discharge at this time as unable to reach any family member.  Ask TOC to help.   Consultants:   Cardiology  Procedures:  Antimicrobials:   Data Reviewed: I have personally reviewed following labs and imaging studies  CBC: Recent Labs  Lab 08/27/20 1501 08/27/20 1510 08/28/20 0503  WBC 25.7* 25.2* 18.6*  NEUTROABS  --  22.4* 14.3*  HGB 15.8* 15.5* 12.4  HCT 46.9* 46.0 37.1  MCV 88.0 89.1 89.2  PLT 442* 443* 182   Basic Metabolic Panel: Recent Labs  Lab 08/27/20 1501 08/27/20 1507 08/28/20 0503 09/01/20 0635  NA 127*  --  133* 135  K 4.3  --  3.3* 4.1  CL 90*  --  102 101  CO2 24  --  21* 26  GLUCOSE 131*  --  111* 113*  BUN 28*  --  20 18  CREATININE 1.19*  --  0.95 0.74  CALCIUM 9.9  --  8.7* 9.3  MG  --  1.7  --   --    GFR: Estimated Creatinine Clearance: 45.8 mL/min (by C-G formula based on SCr of 0.74 mg/dL). Liver Function Tests: Recent Labs  Lab 08/27/20 2036 08/28/20 0503  AST 21 19  ALT 16 16  ALKPHOS 50 53  BILITOT 0.6 0.8  PROT 5.7* 5.4*  ALBUMIN 2.7* 2.6*   No results for input(s): LIPASE, AMYLASE in the last 168 hours. No results for input(s): AMMONIA in the last 168 hours. Coagulation Profile: Recent Labs  Lab 08/27/20 2036  INR 1.2   Cardiac Enzymes: No results for input(s): CKTOTAL, CKMB, CKMBINDEX, TROPONINI in the last 168 hours. BNP (last 3 results) No results for input(s): PROBNP in the  last 8760 hours. HbA1C: No results for input(s): HGBA1C in the last 72 hours. CBG: No results for input(s): GLUCAP in the last 168 hours. Lipid Profile: No results for input(s): CHOL, HDL, LDLCALC, TRIG, CHOLHDL, LDLDIRECT in the last 72 hours. Thyroid Function Tests: No results for input(s): TSH, T4TOTAL, FREET4, T3FREE, THYROIDAB in the last 72 hours. Anemia Panel: No results for input(s): VITAMINB12, FOLATE, FERRITIN, TIBC, IRON, RETICCTPCT in the last 72 hours. Sepsis Labs: Recent Labs  Lab 08/27/20 1708 08/27/20 2036 08/27/20 2238  PROCALCITON <0.10  --   --   LATICACIDVEN  --  1.5 1.8    Recent Results (from the past 240 hour(s))  Resp Panel by RT-PCR (Flu A&B, Covid) Nasopharyngeal Swab     Status: None   Collection Time: 08/27/20  3:02 PM   Specimen: Nasopharyngeal Swab; Nasopharyngeal(NP) swabs in vial transport medium  Result Value Ref Range Status   SARS Coronavirus 2 by RT PCR NEGATIVE NEGATIVE Final    Comment: (NOTE) SARS-CoV-2 target nucleic acids are NOT DETECTED.  The SARS-CoV-2 RNA is generally detectable in upper respiratory specimens during the acute phase of infection. The lowest concentration of SARS-CoV-2 viral copies this assay can detect is 138 copies/mL. A negative result does not preclude SARS-Cov-2 infection and should not be used as the sole basis for treatment or other patient management decisions. A negative result may occur with  improper specimen collection/handling, submission of specimen other than nasopharyngeal swab, presence of viral mutation(s) within the areas targeted by this assay, and inadequate number of viral copies(<138 copies/mL). A negative result must be combined with clinical observations, patient history, and epidemiological information. The expected result is Negative.  Fact Sheet for Patients:  EntrepreneurPulse.com.au  Fact Sheet for Healthcare Providers:   IncredibleEmployment.be  This test is no t yet approved or cleared by the [place] FDA and  has been authorized for detection and/or diagnosis of SARS-CoV-2 by FDA under an Emergency Use Authorization (EUA). This EUA will remain  in effect (meaning this test can be used) for the duration of the COVID-19 declaration under Section 564(b)(1) of the Act, 21 U.S.C.section 360bbb-3(b)(1), unless the authorization is terminated  or revoked sooner.       Influenza A by PCR NEGATIVE NEGATIVE Final   Influenza B by PCR NEGATIVE NEGATIVE Final    Comment: (NOTE) The Xpert Xpress SARS-CoV-2/FLU/RSV plus assay is intended as an aid in the diagnosis of influenza from Nasopharyngeal swab specimens and should not be used as a sole basis for treatment. Nasal washings and aspirates are unacceptable for Xpert Xpress SARS-CoV-2/FLU/RSV testing.  Fact Sheet for Patients: EntrepreneurPulse.com.au  Fact Sheet for Healthcare Providers: IncredibleEmployment.be  This test is not yet approved or cleared by the [place] FDA and has been authorized for detection and/or diagnosis of SARS-CoV-2 by FDA under an Emergency Use Authorization (EUA). This EUA will remain in effect (meaning this test can be used) for the duration of the COVID-19 declaration under Section 564(b)(1) of the Act, 21 U.S.C. section 360bbb-3(b)(1), unless the authorization is terminated or revoked.  Performed at [facility], [address] [place], [place] [address]   Blood culture (routine single)     Status: None   Collection Time: 08/27/20  5:50 PM   Specimen: BLOOD  Result Value Ref Range Status   Specimen Description BLOOD BLOOD RIGHT ARM  Final   Special Requests   Final    BOTTLES DRAWN AEROBIC AND ANAEROBIC Blood Culture adequate volume   Culture   Final    NO GROWTH 5 DAYS Performed at [facility], [address], [place], [place]  [address]    Report Status 09/01/2020 FINAL  Final  MRSA PCR Screening     Status: None   Collection Time: 08/28/20 12:06 PM   Specimen: Nasopharyngeal  Result Value Ref Range Status   MRSA by PCR NEGATIVE NEGATIVE Final    Comment:        The GeneXpert MRSA Assay (FDA approved for NASAL specimens only), is one component of a comprehensive MRSA colonization surveillance program. It is not intended  to diagnose MRSA infection nor to guide or monitor treatment for MRSA infections. Performed at [facility], [address] [place], [place] [address]   Urine Culture     Status: None   Collection Time: 08/29/20  5:00 AM   Specimen: In/Out Cath Urine  Result Value Ref Range Status   Specimen Description   Final    IN/OUT CATH URINE Performed at [facility], [address], [place], [place] [address]    Special Requests   Final    NONE Performed at [facility], [address], [place], [place] [address]    Culture   Final    NO GROWTH Performed at [facility], [place] [address] [place],  [address]    Report Status 08/30/2020 FINAL  Final     Radiology Studies: No results found.  Scheduled Meds:  apixaban  5 mg Oral BID   calcium-vitamin D  2 tablet Oral Daily   diltiazem  240 mg Oral Daily   doxycycline  100 mg Oral Q12H   escitalopram  5 mg Oral Daily   fluticasone  1 spray Each Nare Daily   metoprolol tartrate  25 mg Oral BID   nystatin  5 mL Mouth/Throat QID   pantoprazole  40 mg Oral Daily   polyethylene glycol  17 g Oral Daily   sodium chloride flush  3 mL Intravenous Q12H   Continuous Infusions:  sodium chloride 10 mL/hr at 08/30/20 0400     LOS: 5 days   Time spent: 30 minutes.  Lorella Nimrod, MD [facility]  If 7PM-7AM, please contact night-coverage [email or web address]  09/01/2020, 2:51 PM   This record has been created using Systems analyst. Errors have been sought and  corrected,but may not always be located. Such creation errors do not reflect on the standard of care.

## 2020-09-01 NOTE — Plan of Care (Signed)

## 2020-09-01 NOTE — Progress Notes (Signed)
Mobility Specialist - Progress Note   09/01/20 1500  Mobility  Activity Ambulated in hall  Level of Assistance Minimal assist, patient does 75% or more  Assistive Device Front wheel walker  Distance Ambulated (ft) 40 ft  Mobility Response Tolerated well  Mobility performed by Mobility specialist  $Mobility charge 1 Mobility    Pre-mobility: 71 HR, 95% SpO2 During mobility: 103 HR, 90% SpO2 Post-mobility: 74 HR, 94% SpO2   Pt was lying in bed upon arrival utilizing room air. Pt agreed to session. Pt c/o pain in abdomen "5/10 when I cough" but this did not limit mobility. Pt got to EOB with SBA and stood to RW with minA. Pt began ambulating 40' in hallway with no LOB. Noted PWB on LLE. Pt with decreased speed/steps, bearing weight mostly on RLE this session to offset pain. O2 fluctuates between 90-93% during activity. Pt denies SOB. No heavy breathing noted. Upon return to room, pt c/o feeling tired and had a nose bleed. Mobility notified nurse. Pt's sister entered at the end of session. Overall, pt tolerated session well. Pt was left in recliner with all needs in reach and alarm set.    Kathee Delton Mobility Specialist 09/01/20, 3:19 PM

## 2020-09-01 NOTE — TOC Progression Note (Signed)
Transition of Care [facility]) - Progression Note    Patient Details  Name: Ji Fairburn MRN: [id number] Date of Birth: October 04, 1926  Transition of Care [facility]) CM/SW Contact  Izola Price, RN Phone Number: 09/01/2020, 3:12 PM  Clinical Narrative:   Provider unable to reach sister. [place] for any additional contact numbers but they are the same as [facility] demographic contact information. Pt states she cannot take care of herself at home. [place] PT/OT/RN set up through [facility] but start services date is Monday 12/6 per Tillie Rung at [facility] at 1520. Provider notifed. Simmie Davies RN CM    Expected Discharge Plan: Home/Self Care Barriers to Discharge: Continued Medical Work up  Expected Discharge Plan and Services Expected Discharge Plan: Home/Self Care In-house Referral: NA Discharge Planning Services: NA Post Acute Care Choice: NA Living arrangements for the past 2 months: Single Family Home                 DME Arranged: N/A DME Agency: NA       HH Arranged: NA HH Agency: NA         Social Determinants of Health (SDOH) Interventions    Readmission Risk Interventions No flowsheet data found.

## 2020-09-02 DIAGNOSIS — K219 Gastro-esophageal reflux disease without esophagitis: Secondary | ICD-10-CM | POA: Diagnosis not present

## 2020-09-02 DIAGNOSIS — J189 Pneumonia, unspecified organism: Secondary | ICD-10-CM | POA: Diagnosis not present

## 2020-09-02 DIAGNOSIS — I4891 Unspecified atrial fibrillation: Secondary | ICD-10-CM | POA: Diagnosis not present

## 2020-09-02 DIAGNOSIS — J9601 Acute respiratory failure with hypoxia: Secondary | ICD-10-CM | POA: Diagnosis not present

## 2020-09-02 NOTE — Progress Notes (Signed)
Mobility Specialist - Progress Note   09/02/20 1700  Mobility  Activity Transferred to/from [facility];Transferred:  Bed to chair  Level of Assistance Minimal assist, patient does 75% or more  Assistive Device Front wheel walker  Distance Ambulated (ft) 10 ft  Mobility Response Tolerated well  Mobility performed by Mobility specialist  $Mobility charge 1 Mobility     Pre-mobility: 78 HR, 92% SpO2 Post-mobility: 71 HR, 96% SpO2   Pt was lying in bed upon arrival. Pt agreed to session. Pt voiced that she needed to have a BM, states it would be her 3rd one this date. Pt able to get EOB modI and stand with minA from elevated height. Pt pivoted to [facility] where she had urinal and a loose bowel output. O2 desat to 86% while on BSC and RA. PLB technique utilized. Mobility assisted with peri-care. Pt was able to transfer from [facility] to recliner with CGA and close supervision. Noted pt still presenting with a limp on LLE tolerating only PWB, but pt did state that pain is not as bad today as it was yesterday. Pt was left in recliner with all needs in reach and alarm set. Nurse notified.    Kathee Delton Mobility Specialist 09/02/20, 5:53 PM

## 2020-09-02 NOTE — Progress Notes (Addendum)
   09/02/20 1600  Clinical Encounter Type  Visited With Patient;Health care provider  Visit Type Initial  Referral From Care management  Stress Factors  Patient Stress Factors Major life changes   This chaplain received a referral from Dominica Severin, RN with Case Management to assist the patient with completing her HPOA paperwork in advance of her upcoming discharge. Upon arrival, the patient was observed to be awake, alert, and oriented. She confirmed her interest in completing the paperwork. She also shares that she "will not be able to live alone" once she leaves here, although she very much looks forward to going home. She also shared that she hopes to make arrangements for someone to come and stay with her. She is in the process of doing so at this time. As we talked, the patient took a phone call. This writer waited outside of the patient's room for 15 minutes to allow the patient time for her call. Will return to assist when the patient becomes available and as time allows.  Gennaro Africa, Chaplain

## 2020-09-02 NOTE — TOC Progression Note (Addendum)
Transition of Care [facility]) - Progression Note    Patient Details  Name: Korine Winton MRN: [id number] Date of Birth: 03/18/1927  Transition of Care [facility]) CM/SW Contact  Izola Price, RN Phone Number: 09/02/2020, 4:15 PM  Clinical Narrative:  Per provider request contacting chaplain to assist patient in making niece POA. Jola Babinski [phone]. Chaplain Seth Bake returned page and will initiate process of contacting patient and then niece. Stated it would likely be tomorrow before process could be completed due to notary access on Sunday. Will notify provider. [facility] notified of change.  Simmie Davies RN CM     Expected Discharge Plan: Home/Self Care Barriers to Discharge: Continued Medical Work up  Expected Discharge Plan and Services Expected Discharge Plan: Home/Self Care In-house Referral: NA Discharge Planning Services: NA Post Acute Care Choice: NA Living arrangements for the past 2 months: Single Family Home                 DME Arranged: N/A DME Agency: NA       HH Arranged: NA HH Agency: NA         Social Determinants of Health (SDOH) Interventions    Readmission Risk Interventions No flowsheet data found.

## 2020-09-02 NOTE — Progress Notes (Signed)
PROGRESS NOTE    [name]  ERX:[id number] DOB: 01/17/1927 DOA: 08/27/2020 PCP: Rusty Aus, MD   Brief Narrative: Taken from prior notes. 86 year old female with a history of hypertension, reflux who presents to the hospital ER from the PCP office.  Patient's had a 2-week history of cough, congestion.  Patient states that she normally gets an upper respiratory infection in November.  Patient has been on Levaquin and prednisone.  This was changed to Ceftin and prednisone.  She followed up with her PCP in the office today.  She was noted to be hypoxic with room air saturations in the mid 80s.  She was also tachycardic.  She was sent to the ER for evaluation. Also found to have new onset A. fib with RVR.  CTA negative for PE, she had 8/10 right lower lobe infiltrate.  Admitted for sepsis secondary to pneumonia with failed outpatient therapy.  Completed a course of IV antibiotics.  Subjective: Patient continued to have some cough although improving.  Denies any shortness of breath.  Per patient she is trying hard needs to be her POA.  Assessment & Plan:   Principal Problem:   RLL pneumonia Active Problems:   GERD (gastroesophageal reflux disease)   HTN (hypertension), benign   Acute respiratory failure with hypoxia (HCC)   Hyponatremia   Sepsis due to pneumonia (HCC)   Atrial fibrillation with RVR (HCC)   DNR (do not resuscitate)/DNI(Do Not Intubate)   Hypoxia  Sepsis secondary to right lower lobe pneumonia.  Failed outpatient treatment.  CTA with no PE but did shows multifocal pneumonia predominantly involving right lower lobe.  Continues to have cough which increased with deep breathing, started showing some improvement.  Initially received cefepime and vancomycin, vancomycin was discontinued as MRSA PCR was negative.  Strep pneumo antigen negative.  Procalcitonin negative -Completed the course of cefepime -Complete the course of doxycycline today. -Continue with supportive  care.  A. fib with RVR.  Cardiology was consulted-appreciate their recommendations, IV Cardizem was converted with p.o. rate well controlled now. -Continue Cardizem. -Continue metoprolol  -Continue with Eliquis  AKI.  Resolved  GERD. -Continue with PPI.  Hypertension.  Blood pressure mildly elevated. -Continue holding home Lasix as she still appears dry. -Continue with Cardizem.  Generalized weakness.  PT recommending home health services, which were ordered.  Patient wants to go to SNF before returning home, stating that she cannot take care of herself with current health and she lives alone.  Unable to reach any family member.  Reevaluation with PT was done yesterday and she does not meet the criteria for SNF. -Ask TOC to help with disposition.  Objective: Vitals:   09/02/20 0416 09/02/20 0900 09/02/20 1228 09/02/20 1336  BP: 131/66 (!) 156/79  136/63  Pulse: 92 (!) 106  81  Resp: (!) 23 18  20   Temp: 98.1 F (36.7 C) 98.8 F (37.1 C) 98.8 F (37.1 C) 98.6 F (37 C)  TempSrc: Oral Oral    SpO2: 98% 98%  95%  Weight: 68.7 kg     Height:        Intake/Output Summary (Last 24 hours) at 09/02/2020 1458 Last data filed at 09/02/2020 1100 Gross per 24 hour  Intake 240 ml  Output 1151 ml  Net -911 ml   Filed Weights   09/01/20 0500 09/01/20 0533 09/02/20 0416  Weight: 70.1 kg 71 kg 68.7 kg    Examination:  General.  Frail elderly lady, in no acute distress. Pulmonary.  Lungs  clear bilaterally, normal respiratory effort. CV.  Regular rate and rhythm, no JVD, rub or murmur. Abdomen.  Soft, nontender, nondistended, BS positive. CNS.  Alert and oriented x3.  No focal neurologic deficit. Extremities.  No edema, no cyanosis, pulses intact and symmetrical. Psychiatry.  Judgment and insight appears normal.  DVT prophylaxis: Lovenox Code Status: DNR Family Communication: Discussed with patient, she was able to give me her niece Tye [place] readers number [phone], able to  contact her and she is willing to support her after discharge.  Requesting to be discharged tomorrow.  She is even willing to take her to her own home for few days if needed. She also want to complete paperwork for POA.  I will ask TOC to contact her. Disposition Plan:  Status is: Inpatient  Remains inpatient appropriate because:Inpatient level of care appropriate due to severity of illness   Dispo: The patient is from: Home              Anticipated d/c is to: Home              Anticipated d/c date is: 1 day              Patient currently is medically stable but unsafe discharge. Patient is high risk for deterioration and death.  Might get benefit going to SNF before returning home as she lives alone.  Ask TOC to help.   Consultants:   Cardiology  Procedures:  Antimicrobials:   Data Reviewed: I have personally reviewed following labs and imaging studies  CBC: Recent Labs  Lab 08/27/20 1501 08/27/20 1510 08/28/20 0503  WBC 25.7* 25.2* 18.6*  NEUTROABS  --  22.4* 14.3*  HGB 15.8* 15.5* 12.4  HCT 46.9* 46.0 37.1  MCV 88.0 89.1 89.2  PLT 442* 443* 165   Basic Metabolic Panel: Recent Labs  Lab 08/27/20 1501 08/27/20 1507 08/28/20 0503 09/01/20 0635  NA 127*  --  133* 135  K 4.3  --  3.3* 4.1  CL 90*  --  102 101  CO2 24  --  21* 26  GLUCOSE 131*  --  111* 113*  BUN 28*  --  20 18  CREATININE 1.19*  --  0.95 0.74  CALCIUM 9.9  --  8.7* 9.3  MG  --  1.7  --   --    GFR: Estimated Creatinine Clearance: 42.1 mL/min (by C-G formula based on SCr of 0.74 mg/dL). Liver Function Tests: Recent Labs  Lab 08/27/20 2036 08/28/20 0503  AST 21 19  ALT 16 16  ALKPHOS 50 53  BILITOT 0.6 0.8  PROT 5.7* 5.4*  ALBUMIN 2.7* 2.6*   No results for input(s): LIPASE, AMYLASE in the last 168 hours. No results for input(s): AMMONIA in the last 168 hours. Coagulation Profile: Recent Labs  Lab 08/27/20 2036  INR 1.2   Cardiac Enzymes: No results for input(s): CKTOTAL, CKMB,  CKMBINDEX, TROPONINI in the last 168 hours. BNP (last 3 results) No results for input(s): PROBNP in the last 8760 hours. HbA1C: No results for input(s): HGBA1C in the last 72 hours. CBG: No results for input(s): GLUCAP in the last 168 hours. Lipid Profile: No results for input(s): CHOL, HDL, LDLCALC, TRIG, CHOLHDL, LDLDIRECT in the last 72 hours. Thyroid Function Tests: No results for input(s): TSH, T4TOTAL, FREET4, T3FREE, THYROIDAB in the last 72 hours. Anemia Panel: No results for input(s): VITAMINB12, FOLATE, FERRITIN, TIBC, IRON, RETICCTPCT in the last 72 hours. Sepsis Labs: Recent Labs  Lab 08/27/20  1708 08/27/20 2036 08/27/20 2238  PROCALCITON <0.10  --   --   LATICACIDVEN  --  1.5 1.8    Recent Results (from the past 240 hour(s))  Resp Panel by RT-PCR (Flu A&B, Covid) Nasopharyngeal Swab     Status: None   Collection Time: 08/27/20  3:02 PM   Specimen: Nasopharyngeal Swab; Nasopharyngeal(NP) swabs in vial transport medium  Result Value Ref Range Status   SARS Coronavirus 2 by RT PCR NEGATIVE NEGATIVE Final    Comment: (NOTE) SARS-CoV-2 target nucleic acids are NOT DETECTED.  The SARS-CoV-2 RNA is generally detectable in upper respiratory specimens during the acute phase of infection. The lowest concentration of SARS-CoV-2 viral copies this assay can detect is 138 copies/mL. A negative result does not preclude SARS-Cov-2 infection and should not be used as the sole basis for treatment or other patient management decisions. A negative result may occur with  improper specimen collection/handling, submission of specimen other than nasopharyngeal swab, presence of viral mutation(s) within the areas targeted by this assay, and inadequate number of viral copies(<138 copies/mL). A negative result must be combined with clinical observations, patient history, and epidemiological information. The expected result is Negative.  Fact Sheet for Patients:   EntrepreneurPulse.com.au  Fact Sheet for Healthcare Providers:  IncredibleEmployment.be  This test is no t yet approved or cleared by the [place] FDA and  has been authorized for detection and/or diagnosis of SARS-CoV-2 by FDA under an Emergency Use Authorization (EUA). This EUA will remain  in effect (meaning this test can be used) for the duration of the COVID-19 declaration under Section 564(b)(1) of the Act, 21 U.S.C.section 360bbb-3(b)(1), unless the authorization is terminated  or revoked sooner.       Influenza A by PCR NEGATIVE NEGATIVE Final   Influenza B by PCR NEGATIVE NEGATIVE Final    Comment: (NOTE) The Xpert Xpress SARS-CoV-2/FLU/RSV plus assay is intended as an aid in the diagnosis of influenza from Nasopharyngeal swab specimens and should not be used as a sole basis for treatment. Nasal washings and aspirates are unacceptable for Xpert Xpress SARS-CoV-2/FLU/RSV testing.  Fact Sheet for Patients: EntrepreneurPulse.com.au  Fact Sheet for Healthcare Providers: IncredibleEmployment.be  This test is not yet approved or cleared by the [place] FDA and has been authorized for detection and/or diagnosis of SARS-CoV-2 by FDA under an Emergency Use Authorization (EUA). This EUA will remain in effect (meaning this test can be used) for the duration of the COVID-19 declaration under Section 564(b)(1) of the Act, 21 U.S.C. section 360bbb-3(b)(1), unless the authorization is terminated or revoked.  Performed at [facility], [address] [place], [place] [address]   Blood culture (routine single)     Status: None   Collection Time: 08/27/20  5:50 PM   Specimen: BLOOD  Result Value Ref Range Status   Specimen Description BLOOD BLOOD RIGHT ARM  Final   Special Requests   Final    BOTTLES DRAWN AEROBIC AND ANAEROBIC Blood Culture adequate volume   Culture   Final    NO  GROWTH 5 DAYS Performed at [facility], [address], [place],  [address]    Report Status 09/01/2020 FINAL  Final  MRSA PCR Screening     Status: None   Collection Time: 08/28/20 12:06 PM   Specimen: Nasopharyngeal  Result Value Ref Range Status   MRSA by PCR NEGATIVE NEGATIVE Final    Comment:        The GeneXpert MRSA Assay (FDA  approved for NASAL specimens only), is one component of a comprehensive MRSA colonization surveillance program. It is not intended to diagnose MRSA infection nor to guide or monitor treatment for MRSA infections. Performed at [facility], [address], [place], [place] [address]   Urine Culture     Status: None   Collection Time: 08/29/20  5:00 AM   Specimen: In/Out Cath Urine  Result Value Ref Range Status   Specimen Description   Final    IN/OUT CATH URINE Performed at [facility], [address], [place], [place] [address]    Special Requests   Final    NONE Performed at [facility], [address], [place], [place] [address]    Culture   Final    NO GROWTH Performed at [facility], [place] [address] [place], [place] [address]    Report Status 08/30/2020 FINAL  Final     Radiology Studies: No results found.  Scheduled Meds: . apixaban  5 mg Oral BID  . calcium-vitamin D  2 tablet Oral Daily  . diltiazem  240 mg Oral Daily  . doxycycline  100 mg Oral Q12H  . escitalopram  5 mg Oral Daily  . fluticasone  1 spray Each Nare Daily  . metoprolol tartrate  25 mg Oral BID  . nystatin  5 mL Mouth/Throat QID  . pantoprazole  40 mg Oral Daily  . polyethylene glycol  17 g Oral Daily  . sodium chloride flush  3 mL Intravenous Q12H   Continuous Infusions: . sodium chloride 10 mL/hr at 08/30/20 0400     LOS: 6 days   Time spent: 25 minutes.  Lorella Nimrod, MD [facility]  If 7PM-7AM, please contact night-coverage [email or web address]  09/02/2020, 2:58 PM   This record has  been created using Systems analyst. Errors have been sought and corrected,but may not always be located. Such creation errors do not reflect on the standard of care.

## 2020-09-02 NOTE — Progress Notes (Signed)
   09/02/20 1800  Clinical Encounter Type  Visited With Patient  Visit Type Follow-up  Referral From Chaplain;Care management  Consult/Referral To Chaplain  Spiritual Encounters  Spiritual Needs Brochure  Stress Factors  Patient Stress Factors Major life changes   Returned to provide education and present the patient with the AD information. The patient shared some of her recent losses and things she is dealing with today. She also asked questions about Advanced Directives and what they cover.Thischaplain talked with the patient and encouraged her to be in touch with her attorney for guidance and legal advice that addresses the General Power of [place].   This chaplain will pass on request to follow up with the patient tomorrow morning (09/03/20), to assist with completion before her expected discharge.  Gennaro Africa, Chaplain

## 2020-09-03 DIAGNOSIS — N179 Acute kidney failure, unspecified: Secondary | ICD-10-CM

## 2020-09-03 DIAGNOSIS — J189 Pneumonia, unspecified organism: Secondary | ICD-10-CM | POA: Diagnosis not present

## 2020-09-03 DIAGNOSIS — K219 Gastro-esophageal reflux disease without esophagitis: Secondary | ICD-10-CM | POA: Diagnosis not present

## 2020-09-03 DIAGNOSIS — I4891 Unspecified atrial fibrillation: Secondary | ICD-10-CM | POA: Diagnosis not present

## 2020-09-03 MED ORDER — METOPROLOL TARTRATE 25 MG PO TABS
25.0000 mg | ORAL_TABLET | Freq: Two times a day (BID) | ORAL | 1 refills | Status: DC
Start: 2020-09-03 — End: 2021-08-29

## 2020-09-03 MED ORDER — APIXABAN 5 MG PO TABS
5.0000 mg | ORAL_TABLET | Freq: Two times a day (BID) | ORAL | 1 refills | Status: DC
Start: 2020-09-03 — End: 2021-08-29

## 2020-09-03 MED ORDER — DM-GUAIFENESIN ER 30-600 MG PO TB12
1.0000 | ORAL_TABLET | Freq: Two times a day (BID) | ORAL | 1 refills | Status: DC
Start: 2020-09-03 — End: 2021-08-29

## 2020-09-03 MED ORDER — DM-GUAIFENESIN ER 30-600 MG PO TB12: 1.0000 | ORAL_TABLET | Freq: Two times a day (BID) | ORAL | Status: DC

## 2020-09-03 NOTE — Discharge Summary (Signed)
Physician Discharge Summary  [name] GYF:[id number] DOB: 17-Nov-1926 DOA: 08/27/2020  PCP: Rusty Aus, MD  Admit date: 08/27/2020 Discharge date: 09/03/2020  Admitted From: Home Disposition: Home   Recommendations for Outpatient Follow-up:  1. Follow up with PCP in 1-2 weeks 2. Follow-up with cardiology 3. Please obtain BMP/CBC in one week 4. Please follow up on the following pending results:None  Home Health: Yes Equipment/Devices: Rolling walker Discharge Condition: Fair CODE STATUS: DNR Diet recommendation: Heart Healthy   Brief/Interim Summary: 86 year old female with a history of hypertension, reflux who presents to the hospital ER from the PCP office. Patient's had a 2-week history of cough, congestion. Patient states that she normally gets an upper respiratory infection in November. Patient has been on Levaquin and prednisone. This was changed to Ceftin and prednisone. She followed up with her PCP in the office today. She was noted to be hypoxic with room air saturations in the mid 80s. She was also tachycardic. She was sent to the ER for evaluation. Also found to have new onset A. fib with RVR.  CTA negative for PE, she had right lower lobe infiltrate.  Admitted for sepsis secondary to pneumonia with failed outpatient therapy.  Completed a course of IV antibiotics with cefepime and doxycycline.  Her saturation improved and she was saturating well on room air before discharge.  Patient continued to feel weak.  PT evaluated her and she does not qualify for SNF.  They are recommending home health services which were ordered.  Her niece will be her POA and help her at home. Patient will also need a repeat chest x-ray in 4 to 6 weeks to see the resolution of her pneumonia. Patient is a frail elderly lady, very slow recovery from pneumonia.  Remains high risk for deterioration and death.  On presentation she also had A. fib with RVR.  Cardiology was consulted.  Initially  requiring IV Cardizem which was transitioned to p.o.  Metoprolol was added by cardiology.  She had well heart rate control before discharge.  She was also started on Eliquis by cardiology.  She will continue with rest of her home meds and follow-up with cardiology and her primary care provider.  Discharge Diagnoses:  Principal Problem:   RLL pneumonia Active Problems:   GERD (gastroesophageal reflux disease)   HTN (hypertension), benign   Acute respiratory failure with hypoxia (HCC)   Hyponatremia   Community acquired pneumonia   Atrial fibrillation with RVR ([place])   DNR (do not resuscitate)/DNI(Do Not Intubate)   Hypoxia   AKI (acute kidney injury) [facility])   Discharge Instructions  Discharge Instructions    Diet - low sodium heart healthy   Complete by: As directed    Discharge instructions   Complete by: As directed    It was pleasure taking care of you. Your cardiologist added metoprolol for better control of your heart rate, as your blood pressure is within normal limit and metoprolol also lower your blood pressure we are discontinuing your other medication, please follow-up with your primary care provider and cardiologist closely and they can restart if needed. You will need a repeat chest x-ray in 4 to 6 weeks to see the resolution of your pneumonia.   Increase activity slowly   Complete by: As directed      Allergies as of 09/03/2020      Reactions   Amoxicillin-pot Clavulanate Nausea Only   Cefprozil Nausea Only   Clarithromycin Nausea Only   Prednisone    Other  reaction(s): Other (See Comments) Hyperglycemia   Pseudoephedrine    Other reaction(s): Unknown   Penicillins Rash   Sulfa Antibiotics Rash      Medication List    STOP taking these medications   cefUROXime 500 MG tablet Commonly known as: CEFTIN   triamterene-hydrochlorothiazide 37.5-25 MG capsule Commonly known as: DYAZIDE     TAKE these medications   apixaban 5 MG Tabs tablet Commonly known  as: ELIQUIS Take 1 tablet (5 mg total) by mouth 2 (two) times daily.   Calcium High Potency/Vitamin D 600-200 MG-UNIT Tabs Generic drug: Calcium Carbonate-Vitamin D Take 2 tablets by mouth daily.   Cartia XT 240 MG 24 hr capsule Generic drug: diltiazem Take 240 mg by mouth daily.   cetirizine 10 MG tablet Commonly known as: ZYRTEC Take 10 mg by mouth at bedtime.   dextromethorphan-guaiFENesin 30-600 MG 12hr tablet Commonly known as: MUCINEX DM Take 1 tablet by mouth 2 (two) times daily.   escitalopram 5 MG tablet Commonly known as: LEXAPRO Take 5 mg by mouth in the morning and at bedtime.   fluticasone 50 MCG/ACT nasal spray Commonly known as: FLONASE Place 1 spray into the nose daily as needed.   furosemide 20 MG tablet Commonly known as: LASIX Take 20 mg by mouth daily.   ipratropium-albuterol 0.5-2.5 (3) MG/3ML Soln Commonly known as: DUONEB Inhale 3 mLs into the lungs 3 (three) times daily.   metoprolol tartrate 25 MG tablet Commonly known as: LOPRESSOR Take 1 tablet (25 mg total) by mouth 2 (two) times daily.   omeprazole 20 MG capsule Commonly known as: PRILOSEC Take 20 mg by mouth daily.   OPTIVE 0.5-0.9 % ophthalmic solution Generic drug: carboxymethylcellul-glycerin Place 1 drop into both eyes 4 (four) times daily as needed for dry eyes.   potassium chloride SA 20 MEQ tablet Commonly known as: KLOR-CON Take 20 mEq by mouth 3 (three) times daily.       Follow-up Information    Corey Skains, MD Follow up in 1 week(s).   Specialty: Cardiology Contact information: [facility] [place] [address] [phone]        Rusty Aus, MD. Schedule an appointment as soon as possible for a visit.   Specialty: Internal Medicine Contact information: [place] [address] [phone]              Allergies  Allergen Reactions  .  Amoxicillin-Pot Clavulanate Nausea Only  . Cefprozil Nausea Only  . Clarithromycin Nausea Only  . Prednisone     Other reaction(s): Other (See Comments) Hyperglycemia  . Pseudoephedrine     Other reaction(s): Unknown  . Penicillins Rash  . Sulfa Antibiotics Rash    Consultations:  Cardiology  Procedures/Studies: CT Angio Chest PE W and/or Wo Contrast  Result Date: 08/27/2020 CLINICAL DATA:  86 year old female with concern for pulmonary embolism. EXAM: CT ANGIOGRAPHY CHEST WITH CONTRAST TECHNIQUE: Multidetector CT imaging of the chest was performed using the standard protocol during bolus administration of intravenous contrast. Multiplanar CT image reconstructions and MIPs were obtained to evaluate the vascular anatomy. CONTRAST:  21mL OMNIPAQUE IOHEXOL 350 MG/ML SOLN COMPARISON:  Chest CT dated 03/05/2009 and radiograph dated 08/27/2020. FINDINGS: Cardiovascular: There is no cardiomegaly or pericardial effusion. There is coronary vascular calcification. Mild atherosclerotic calcification of the thoracic aorta. No aneurysmal dilatation. No pulmonary artery embolus identified. Mediastinum/Nodes: Right hilar adenopathy measures 15 mm. Subcarinal lymph node measures 15 mm short axis.  There is a small hiatal hernia. The esophagus is grossly unremarkable. No mediastinal fluid collection. Lungs/Pleura: Mild bilateral upper lobe bronchiectasis. There are clusters of nodular and streaky densities in the right upper lobe with areas of scarring. Findings consistent with acute pneumonia superimposed on background of chronic changes. Focal area of scarring noted in the lingula. There is an area of consolidative change at the right lung base. There is diffuse nodular densities throughout the right lung consistent with infection, possibly atypical in etiology or aspiration. Clinical correlation is recommended. There is peribronchial thickening primarily in the right lower lobe. No pleural effusion pneumothorax.  The central airways are patent. Upper Abdomen: None Musculoskeletal: Degenerative changes of the spine. No acute osseous pathology. Review of the MIP images confirms the above findings. IMPRESSION: 1. No CT evidence of pulmonary embolism. 2. Multifocal pneumonia predominantly involving the right lower lobe, possibly atypical in etiology or aspiration. Clinical correlation and follow-up to resolution recommended. 3. Aortic Atherosclerosis (ICD10-I70.0). Electronically Signed   By: Anner Crete M.D.   On: 08/27/2020 16:30   DG Chest Portable 1 View  Result Date: 08/27/2020 CLINICAL DATA:  Shortness of breath, tachycardia EXAM: PORTABLE CHEST 1 VIEW COMPARISON:  Radiograph 06/30/2018, CT 03/05/2009 FINDINGS: Chronic hyperinflation and coarsened interstitial changes are again seen albeit with new areas of more patchy and consolidative opacity in the right suprahilar lung and right lung base with associated airways thickening. Left lung remains relatively clear. No pneumothorax. Blunting of the right costophrenic sulcus could reflect scarring or trace effusion. Degenerative changes are present in the imaged spine and shoulders. Telemetry leads overlie the chest. IMPRESSION: 1. Areas of patchy and consolidative opacity in the right suprahilar lung and right lung base with associated airways thickening, concerning for pneumonia or sequela aspiration given history of dysphagia and esophageal spasm. 2. Chronic hyperinflation and coarsened interstitial changes. 3. Possible trace right pleural effusion or scarring. Electronically Signed   By: Lovena Le M.D.   On: 08/27/2020 15:28   ECHOCARDIOGRAM COMPLETE  Result Date: 08/29/2020    ECHOCARDIOGRAM REPORT   Patient Name:   [facility] Date of Exam: 08/28/2020 Medical Rec #:  [id number]  Height:       65.5 in Accession #:    [id number] Weight:       156.8 lb Date of Birth:  11-28-1926  BSA:          1.794 m Patient Age:    5 years   BP:           155/59 mmHg  Patient Gender: F          HR:           90 bpm. Exam Location:  [facility] Procedure: 2D Echo, Cardiac Doppler and Color Doppler Indications:     I48.91 Atrial Fibrillation  History:         Patient has no prior history of Echocardiogram examinations.                  Risk Factors:Hypertension. Edema.  Sonographer:     Wilford Sports Rodgers-Jones Referring Phys:  [id number] ERIC CHEN Diagnosing Phys: Kate Sable MD IMPRESSIONS  1. Left ventricular ejection fraction, by estimation, is 60 to 65%. The left ventricle has normal function. The left ventricle has no regional wall motion abnormalities. There is mild left ventricular hypertrophy. Left ventricular diastolic parameters are indeterminate.  2. Right ventricular systolic function is normal. The right ventricular size is normal. There is normal pulmonary artery systolic pressure.  3. The mitral valve is normal in structure. No evidence of mitral valve regurgitation.  4. The aortic valve is tricuspid. Aortic valve regurgitation is not visualized. Mild aortic valve sclerosis is present, with no evidence of aortic valve stenosis.  5. The inferior vena cava is normal in size with greater than 50% respiratory variability, suggesting right atrial pressure of 3 mmHg. FINDINGS  Left Ventricle: Left ventricular ejection fraction, by estimation, is 60 to 65%. The left ventricle has normal function. The left ventricle has no regional wall motion abnormalities. The left ventricular internal cavity size was normal in size. There is  mild left ventricular hypertrophy. Left ventricular diastolic parameters are indeterminate. Right Ventricle: The right ventricular size is normal. No increase in right ventricular wall thickness. Right ventricular systolic function is normal. There is normal pulmonary artery systolic pressure. The tricuspid regurgitant velocity is 2.43 m/s, and  with an assumed right atrial pressure of 3 mmHg, the estimated right ventricular systolic pressure is 78.4 mmHg.  Left Atrium: Left atrial size was normal in size. Right Atrium: Right atrial size was normal in size. Pericardium: There is no evidence of pericardial effusion. Mitral Valve: The mitral valve is normal in structure. No evidence of mitral valve regurgitation. Tricuspid Valve: The tricuspid valve is normal in structure. Tricuspid valve regurgitation is trivial. Aortic Valve: The aortic valve is tricuspid. Aortic valve regurgitation is not visualized. Mild aortic valve sclerosis is present, with no evidence of aortic valve stenosis. Pulmonic Valve: The pulmonic valve was not well visualized. Pulmonic valve regurgitation is not visualized. Aorta: The aortic root and ascending aorta are structurally normal, with no evidence of dilitation. Venous: The inferior vena cava is normal in size with greater than 50% respiratory variability, suggesting right atrial pressure of 3 mmHg. IAS/Shunts: No atrial level shunt detected by color flow Doppler.  LEFT VENTRICLE PLAX 2D LVIDd:         3.46 cm LVIDs:         2.44 cm LV PW:         1.21 cm LV IVS:        1.45 cm LVOT diam:     1.90 cm LV SV:         58 LV SV Index:   32 LVOT Area:     2.84 cm  RIGHT VENTRICLE             IVC RV Basal diam:  3.77 cm     IVC diam: 1.37 cm RV S prime:     14.77 cm/s TAPSE (M-mode): 1.5 cm LEFT ATRIUM             Index       RIGHT ATRIUM           Index LA diam:        3.80 cm 2.12 cm/m  RA Area:     13.30 cm LA Vol (A2C):   45.5 ml 25.36 ml/m RA Volume:   33.50 ml  18.67 ml/m LA Vol (A4C):   27.6 ml 15.39 ml/m LA Biplane Vol: 36.2 ml 20.18 ml/m  AORTIC VALVE LVOT Vmax:   130.33 cm/s LVOT Vmean:  91.467 cm/s LVOT VTI:    0.203 m  AORTA Ao Root diam: 3.20 cm Ao Asc diam:  3.40 cm MV E velocity: 113.75 cm/s  TRICUSPID VALVE  TR Peak grad:   23.6 mmHg                             TR Vmax:        243.00 cm/s                              SHUNTS                             Systemic VTI:  0.20 m                              Systemic Diam: 1.90 cm Kate Sable MD Electronically signed by Kate Sable MD Signature Date/Time: 08/29/2020/12:22:07 PM    Final     Subjective: Patient has no new complaints when seen today.  Stating that she is being approached by her fellows from church who will help her at home.  Discussed that her niece also offered to stay with her for some time before returning home.  Patient will think over it.  Again put emphasis that she should have someone with her initially.  She feels more comfortable at home.  Discharge Exam: Vitals:   09/03/20 1138 09/03/20 1139  BP: 114/77   Pulse: 78   Resp: (!) 22 (!) 22  Temp:  (!) 97.4 F (36.3 C)  SpO2: 97%    Vitals:   09/03/20 0627 09/03/20 0836 09/03/20 1138 09/03/20 1139  BP: (!) 119/53 132/68 114/77   Pulse: 69 74 78   Resp:   (!) 22 (!) 22  Temp:  97.8 F (36.6 C)  (!) 97.4 F (36.3 C)  TempSrc: Oral Oral  Oral  SpO2: 100% 100% 97%   Weight: 71 kg     Height:        General: Pt is alert, awake, not in acute distress Cardiovascular: RRR, S1/S2 +, no rubs, no gallops Respiratory: CTA bilaterally, no wheezing, no rhonchi Abdominal: Soft, NT, ND, bowel sounds + Extremities: no edema, no cyanosis   The results of significant diagnostics from this hospitalization (including imaging, microbiology, ancillary and laboratory) are listed below for reference.    Microbiology: Recent Results (from the past 240 hour(s))  Resp Panel by RT-PCR (Flu A&B, Covid) Nasopharyngeal Swab     Status: None   Collection Time: 08/27/20  3:02 PM   Specimen: Nasopharyngeal Swab; Nasopharyngeal(NP) swabs in vial transport medium  Result Value Ref Range Status   SARS Coronavirus 2 by RT PCR NEGATIVE NEGATIVE Final    Comment: (NOTE) SARS-CoV-2 target nucleic acids are NOT DETECTED.  The SARS-CoV-2 RNA is generally detectable in upper respiratory specimens during the acute phase of infection. The lowest concentration of SARS-CoV-2 viral  copies this assay can detect is 138 copies/mL. A negative result does not preclude SARS-Cov-2 infection and should not be used as the sole basis for treatment or other patient management decisions. A negative result may occur with  improper specimen collection/handling, submission of specimen other than nasopharyngeal swab, presence of viral mutation(s) within the areas targeted by this assay, and inadequate number of viral copies(<138 copies/mL). A negative result must be combined with clinical observations, patient history, and epidemiological information. The expected result is Negative.  Fact Sheet for Patients:  EntrepreneurPulse.com.au  Fact Sheet for Healthcare Providers:  IncredibleEmployment.be  This test is no t yet approved or cleared by the [place] and  has been authorized for detection and/or diagnosis of SARS-CoV-2 by FDA under an Emergency Use Authorization (EUA). This EUA will remain  in effect (meaning this test can be used) for the duration of the COVID-19 declaration under Section 564(b)(1) of the Act, 21 U.S.C.section 360bbb-3(b)(1), unless the authorization is terminated  or revoked sooner.       Influenza A by PCR NEGATIVE NEGATIVE Final   Influenza B by PCR NEGATIVE NEGATIVE Final    Comment: (NOTE) The Xpert Xpress SARS-CoV-2/FLU/RSV plus assay is intended as an aid in the diagnosis of influenza from Nasopharyngeal swab specimens and should not be used as a sole basis for treatment. Nasal washings and aspirates are unacceptable for Xpert Xpress SARS-CoV-2/FLU/RSV testing.  Fact Sheet for Patients: EntrepreneurPulse.com.au  Fact Sheet for Healthcare Providers: IncredibleEmployment.be  This test is not yet approved or cleared by the [place] FDA and has been authorized for detection and/or diagnosis of SARS-CoV-2 by FDA under an Emergency Use Authorization (EUA). This  EUA will remain in effect (meaning this test can be used) for the duration of the COVID-19 declaration under Section 564(b)(1) of the Act, 21 U.S.C. section 360bbb-3(b)(1), unless the authorization is terminated or revoked.  Performed at [facility], [address] [place], [place] [address]   Blood culture (routine single)     Status: None   Collection Time: 08/27/20  5:50 PM   Specimen: BLOOD  Result Value Ref Range Status   Specimen Description BLOOD BLOOD RIGHT ARM  Final   Special Requests   Final    BOTTLES DRAWN AEROBIC AND ANAEROBIC Blood Culture adequate volume   Culture   Final    NO GROWTH 5 DAYS Performed at [facility], [address] [place], [place] [address]    Report Status 09/01/2020 FINAL  Final  MRSA PCR Screening     Status: None   Collection Time: 08/28/20 12:06 PM   Specimen: Nasopharyngeal  Result Value Ref Range Status   MRSA by PCR NEGATIVE NEGATIVE Final    Comment:        The GeneXpert MRSA Assay (FDA approved for NASAL specimens only), is one component of a comprehensive MRSA colonization surveillance program. It is not intended to diagnose MRSA infection nor to guide or monitor treatment for MRSA infections. Performed at [facility], [address] [place], [place] [address]   Urine Culture     Status: None   Collection Time: 08/29/20  5:00 AM   Specimen: In/Out Cath Urine  Result Value Ref Range Status   Specimen Description   Final    IN/OUT CATH URINE Performed at [facility], [address] [place], [place] [address]    Special Requests   Final    NONE Performed at [facility], [address] [place], [place] [address]    Culture   Final    NO GROWTH Performed at [facility], [place] [address], [place], [place] [address]    Report Status 08/30/2020 FINAL  Final     Labs: BNP (last 3 results) Recent Labs    08/27/20 2036  BNP 938.1*   Basic Metabolic  Panel: Recent Labs  Lab 08/27/20 1501 08/27/20 1507 08/28/20 0503 09/01/20 0635  NA 127*  --  133* 135  K 4.3  --  3.3* 4.1  CL 90*  --  102 101  CO2 24  --  21* 26  GLUCOSE 131*  --  111* 113*  BUN 28*  --  20 18  CREATININE 1.19*  --  0.95 0.74  CALCIUM 9.9  --  8.7* 9.3  MG  --  1.7  --   --    Liver Function Tests: Recent Labs  Lab 08/27/20 2036 08/28/20 0503  AST 21 19  ALT 16 16  ALKPHOS 50 53  BILITOT 0.6 0.8  PROT 5.7* 5.4*  ALBUMIN 2.7* 2.6*   No results for input(s): LIPASE, AMYLASE in the last 168 hours. No results for input(s): AMMONIA in the last 168 hours. CBC: Recent Labs  Lab 08/27/20 1501 08/27/20 1510 08/28/20 0503  WBC 25.7* 25.2* 18.6*  NEUTROABS  --  22.4* 14.3*  HGB 15.8* 15.5* 12.4  HCT 46.9* 46.0 37.1  MCV 88.0 89.1 89.2  PLT 442* 443* 315   Cardiac Enzymes: No results for input(s): CKTOTAL, CKMB, CKMBINDEX, TROPONINI in the last 168 hours. BNP: Invalid input(s): POCBNP CBG: No results for input(s): GLUCAP in the last 168 hours. D-Dimer No results for input(s): DDIMER in the last 72 hours. Hgb A1c No results for input(s): HGBA1C in the last 72 hours. Lipid Profile No results for input(s): CHOL, HDL, LDLCALC, TRIG, CHOLHDL, LDLDIRECT in the last 72 hours. Thyroid function studies No results for input(s): TSH, T4TOTAL, T3FREE, THYROIDAB in the last 72 hours.  Invalid input(s): FREET3 Anemia work up No results for input(s): VITAMINB12, FOLATE, FERRITIN, TIBC, IRON, RETICCTPCT in the last 72 hours. Urinalysis    Component Value Date/Time   COLORURINE YELLOW (A) 08/29/2020 0500   APPEARANCEUR CLEAR (A) 08/29/2020 0500   APPEARANCEUR Clear 02/20/2013 1404   LABSPEC 1.014 08/29/2020 0500   LABSPEC 1.006 02/20/2013 1404   PHURINE 6.0 08/29/2020 0500   GLUCOSEU NEGATIVE 08/29/2020 0500   GLUCOSEU >=500 02/20/2013 1404   HGBUR SMALL (A) 08/29/2020 0500   BILIRUBINUR NEGATIVE 08/29/2020 0500   BILIRUBINUR Negative 02/20/2013 1404    KETONESUR NEGATIVE 08/29/2020 0500   PROTEINUR NEGATIVE 08/29/2020 0500   NITRITE NEGATIVE 08/29/2020 0500   LEUKOCYTESUR NEGATIVE 08/29/2020 0500   LEUKOCYTESUR Negative 02/20/2013 1404   Sepsis Labs Invalid input(s): PROCALCITONIN,  WBC,  LACTICIDVEN Microbiology Recent Results (from the past 240 hour(s))  Resp Panel by RT-PCR (Flu A&B, Covid) Nasopharyngeal Swab     Status: None   Collection Time: 08/27/20  3:02 PM   Specimen: Nasopharyngeal Swab; Nasopharyngeal(NP) swabs in vial transport medium  Result Value Ref Range Status   SARS Coronavirus 2 by RT PCR NEGATIVE NEGATIVE Final    Comment: (NOTE) SARS-CoV-2 target nucleic acids are NOT DETECTED.  The SARS-CoV-2 RNA is generally detectable in upper respiratory specimens during the acute phase of infection. The lowest concentration of SARS-CoV-2 viral copies this assay can detect is 138 copies/mL. A negative result does not preclude SARS-Cov-2 infection and should not be used as the sole basis for treatment or other patient management decisions. A negative result may occur with  improper specimen collection/handling, submission of specimen other than nasopharyngeal swab, presence of viral mutation(s) within the areas targeted by this assay, and inadequate number of viral copies(<138 copies/mL). A negative result must be combined with clinical observations, patient history, and epidemiological information. The expected result is Negative.  Fact Sheet for Patients:  EntrepreneurPulse.com.au  Fact Sheet for Healthcare Providers:  IncredibleEmployment.be  This test is no t yet approved or cleared by the [place] FDA and  has been authorized for detection and/or diagnosis of SARS-CoV-2 by  FDA under an Emergency Use Authorization (EUA). This EUA will remain  in effect (meaning this test can be used) for the duration of the COVID-19 declaration under Section 564(b)(1) of the Act,  21 U.S.C.section 360bbb-3(b)(1), unless the authorization is terminated  or revoked sooner.       Influenza A by PCR NEGATIVE NEGATIVE Final   Influenza B by PCR NEGATIVE NEGATIVE Final    Comment: (NOTE) The Xpert Xpress SARS-CoV-2/FLU/RSV plus assay is intended as an aid in the diagnosis of influenza from Nasopharyngeal swab specimens and should not be used as a sole basis for treatment. Nasal washings and aspirates are unacceptable for Xpert Xpress SARS-CoV-2/FLU/RSV testing.  Fact Sheet for Patients: EntrepreneurPulse.com.au  Fact Sheet for Healthcare Providers: IncredibleEmployment.be  This test is not yet approved or cleared by the [place] FDA and has been authorized for detection and/or diagnosis of SARS-CoV-2 by FDA under an Emergency Use Authorization (EUA). This EUA will remain in effect (meaning this test can be used) for the duration of the COVID-19 declaration under Section 564(b)(1) of the Act, 21 U.S.C. section 360bbb-3(b)(1), unless the authorization is terminated or revoked.  Performed at [facility], [address] [place], [place] [address]   Blood culture (routine single)     Status: None   Collection Time: 08/27/20  5:50 PM   Specimen: BLOOD  Result Value Ref Range Status   Specimen Description BLOOD BLOOD RIGHT ARM  Final   Special Requests   Final    BOTTLES DRAWN AEROBIC AND ANAEROBIC Blood Culture adequate volume   Culture   Final    NO GROWTH 5 DAYS Performed at [facility], [address] [place], [place] [address]    Report Status 09/01/2020 FINAL  Final  MRSA PCR Screening     Status: None   Collection Time: 08/28/20 12:06 PM   Specimen: Nasopharyngeal  Result Value Ref Range Status   MRSA by PCR NEGATIVE NEGATIVE Final    Comment:        The GeneXpert MRSA Assay (FDA approved for NASAL specimens only), is one component of a comprehensive MRSA colonization surveillance  program. It is not intended to diagnose MRSA infection nor to guide or monitor treatment for MRSA infections. Performed at [facility], [address], [place], [place] [address]   Urine Culture     Status: None   Collection Time: 08/29/20  5:00 AM   Specimen: In/Out Cath Urine  Result Value Ref Range Status   Specimen Description   Final    IN/OUT CATH URINE Performed at [facility], [address] [place], [place] [address]    Special Requests   Final    NONE Performed at [facility], [address], [place], [place] [address]    Culture   Final    NO GROWTH Performed at [facility], [place] [address] [place], [place] [address]    Report Status 08/30/2020 FINAL  Final    Time coordinating discharge: Over 30 minutes  SIGNED:  Lorella Nimrod, MD  [facility] 09/03/2020, 2:11 PM  If 7PM-7AM, please contact night-coverage [email or web address]  This record has been created using Systems analyst. Errors have been sought and corrected,but may not always be located. Such creation errors do not reflect on the standard of care.

## 2020-09-03 NOTE — Progress Notes (Signed)
Patient discharged per orders, PIV and tele removed from patient. POA unable to be completed as chaplain states he came by but patient was asleep. Dr. Reesa Chew updated. Patient taken down via wheelchair by volunteer services.

## 2020-09-03 NOTE — Plan of Care (Signed)
  Problem: Clinical Measurements: Goal: Respiratory complications will improve Outcome: Progressing   Problem: Coping: Goal: Level of anxiety will decrease Outcome: Progressing   Problem: Elimination: Goal: Will not experience complications related to urinary retention Outcome: Progressing   Problem: Pain Managment: Goal: General experience of comfort will improve Outcome: Progressing   

## 2020-09-03 NOTE — TOC Transition Note (Signed)
Transition of Care [facility]) - CM/SW Discharge Note   Patient Details  Name: [name] MRN: [id number] Date of Birth: 05-26-1927  Transition of Care [facility]) CM/SW Contact:  Kerin Salen, RN Phone Number: 09/03/2020, 2:38 PM   Clinical Narrative:   Patient scheduled for discharge to home. TOC barriers resolved.    Final next level of care: [place] Barriers to Discharge: Barriers Resolved   Patient Goals and CMS Choice Patient states their goals for this hospitalization and ongoing recovery are:: Return home.   Choice offered to / list presented to : NA  Discharge Placement                       Discharge Plan and Services In-house Referral: NA Discharge Planning Services: NA Post Acute Care Choice: NA          DME Arranged: N/A DME Agency: NA       HH Arranged: RN, PT, OT HH Agency: NA Date HH Agency Contacted: 09/03/20 Time [place]: 1000 Representative spoke with at [place]: [place] [phone]  Social Determinants of Health ([place]) Interventions     Readmission Risk Interventions No flowsheet data found.

## 2020-09-03 NOTE — Care Management Important Message (Signed)
Important Message  Patient Details  Name: [name] MRN: [id number] Date of Birth: [birth year]   Medicare Important Message Given:  Yes     Dannette Barbara 09/03/2020, 1:32 PM

## 2020-09-03 NOTE — Progress Notes (Signed)
Mobility Specialist - Progress Note   09/03/20 1339  Mobility  Activity Ambulated in room;Ambulated in hall  Level of Assistance Standby assist, set-up cues, supervision of patient - no hands on  Assistive Device Front wheel walker  Distance Ambulated (ft) 90 ft  Mobility Response Tolerated well  Mobility performed by Mobility specialist  $Mobility charge 1 Mobility    Pre-mobility: 64 HR, 98% SpO2 During mobility: 67 HR, 89% SpO2 Post-mobility: 73 HR, 97% SpO2   Pt long-sitting in bed upon arrival. Pt agreed to session. Pt SBA t/o session. Pt ambulated ~90' total in room and hallway. Further ambulation limited by fatigue. No LOB noted. Pt has slow and steady gait. No c/o pain or SOB. O2 monitored t/o session. O2 sat 80- mid 90s during ambulation. Unable to get exact reading d/t poor pleth. Overall, pt tolerated session well. Ext catheter removed. Encouraged pt to use BSC. Nurse notified. Pt left laying in bed w/ alarm set. All needs placed in reach.    Kaesen Rodriguez Mobility Specialist  09/03/20, 1:44 PM

## 2020-09-03 NOTE — Plan of Care (Signed)

## 2020-09-04 DIAGNOSIS — I4891 Unspecified atrial fibrillation: Secondary | ICD-10-CM | POA: Diagnosis not present

## 2020-09-04 DIAGNOSIS — K219 Gastro-esophageal reflux disease without esophagitis: Secondary | ICD-10-CM | POA: Diagnosis not present

## 2020-09-04 DIAGNOSIS — Z792 Long term (current) use of antibiotics: Secondary | ICD-10-CM | POA: Diagnosis not present

## 2020-09-04 DIAGNOSIS — Z7901 Long term (current) use of anticoagulants: Secondary | ICD-10-CM | POA: Diagnosis not present

## 2020-09-04 DIAGNOSIS — M1990 Unspecified osteoarthritis, unspecified site: Secondary | ICD-10-CM | POA: Diagnosis not present

## 2020-09-04 DIAGNOSIS — J309 Allergic rhinitis, unspecified: Secondary | ICD-10-CM | POA: Diagnosis not present

## 2020-09-04 DIAGNOSIS — I10 Essential (primary) hypertension: Secondary | ICD-10-CM | POA: Diagnosis not present

## 2020-09-04 DIAGNOSIS — Z86010 Personal history of colonic polyps: Secondary | ICD-10-CM | POA: Diagnosis not present

## 2020-09-04 DIAGNOSIS — J189 Pneumonia, unspecified organism: Secondary | ICD-10-CM | POA: Diagnosis not present

## 2020-09-05 DIAGNOSIS — J189 Pneumonia, unspecified organism: Secondary | ICD-10-CM | POA: Diagnosis not present

## 2020-09-05 DIAGNOSIS — J309 Allergic rhinitis, unspecified: Secondary | ICD-10-CM | POA: Diagnosis not present

## 2020-09-05 DIAGNOSIS — I10 Essential (primary) hypertension: Secondary | ICD-10-CM | POA: Diagnosis not present

## 2020-09-05 DIAGNOSIS — Z7901 Long term (current) use of anticoagulants: Secondary | ICD-10-CM | POA: Diagnosis not present

## 2020-09-05 DIAGNOSIS — K219 Gastro-esophageal reflux disease without esophagitis: Secondary | ICD-10-CM | POA: Diagnosis not present

## 2020-09-05 DIAGNOSIS — M1990 Unspecified osteoarthritis, unspecified site: Secondary | ICD-10-CM | POA: Diagnosis not present

## 2020-09-05 DIAGNOSIS — Z792 Long term (current) use of antibiotics: Secondary | ICD-10-CM | POA: Diagnosis not present

## 2020-09-05 DIAGNOSIS — I4891 Unspecified atrial fibrillation: Secondary | ICD-10-CM | POA: Diagnosis not present

## 2020-09-05 DIAGNOSIS — Z86010 Personal history of colonic polyps: Secondary | ICD-10-CM | POA: Diagnosis not present

## 2020-09-06 DIAGNOSIS — I4891 Unspecified atrial fibrillation: Secondary | ICD-10-CM | POA: Diagnosis not present

## 2020-09-06 DIAGNOSIS — I10 Essential (primary) hypertension: Secondary | ICD-10-CM | POA: Diagnosis not present

## 2020-09-06 DIAGNOSIS — Z86010 Personal history of colonic polyps: Secondary | ICD-10-CM | POA: Diagnosis not present

## 2020-09-06 DIAGNOSIS — J189 Pneumonia, unspecified organism: Secondary | ICD-10-CM | POA: Diagnosis not present

## 2020-09-06 DIAGNOSIS — M1990 Unspecified osteoarthritis, unspecified site: Secondary | ICD-10-CM | POA: Diagnosis not present

## 2020-09-06 DIAGNOSIS — Z792 Long term (current) use of antibiotics: Secondary | ICD-10-CM | POA: Diagnosis not present

## 2020-09-06 DIAGNOSIS — J309 Allergic rhinitis, unspecified: Secondary | ICD-10-CM | POA: Diagnosis not present

## 2020-09-06 DIAGNOSIS — K219 Gastro-esophageal reflux disease without esophagitis: Secondary | ICD-10-CM | POA: Diagnosis not present

## 2020-09-06 DIAGNOSIS — Z7901 Long term (current) use of anticoagulants: Secondary | ICD-10-CM | POA: Diagnosis not present

## 2020-09-10 DIAGNOSIS — Z86010 Personal history of colonic polyps: Secondary | ICD-10-CM | POA: Diagnosis not present

## 2020-09-10 DIAGNOSIS — Z792 Long term (current) use of antibiotics: Secondary | ICD-10-CM | POA: Diagnosis not present

## 2020-09-10 DIAGNOSIS — J189 Pneumonia, unspecified organism: Secondary | ICD-10-CM | POA: Diagnosis not present

## 2020-09-10 DIAGNOSIS — M1990 Unspecified osteoarthritis, unspecified site: Secondary | ICD-10-CM | POA: Diagnosis not present

## 2020-09-10 DIAGNOSIS — I10 Essential (primary) hypertension: Secondary | ICD-10-CM | POA: Diagnosis not present

## 2020-09-10 DIAGNOSIS — I4891 Unspecified atrial fibrillation: Secondary | ICD-10-CM | POA: Diagnosis not present

## 2020-09-10 DIAGNOSIS — I517 Cardiomegaly: Secondary | ICD-10-CM | POA: Diagnosis not present

## 2020-09-10 DIAGNOSIS — E871 Hypo-osmolality and hyponatremia: Secondary | ICD-10-CM | POA: Diagnosis not present

## 2020-09-10 DIAGNOSIS — J309 Allergic rhinitis, unspecified: Secondary | ICD-10-CM | POA: Diagnosis not present

## 2020-09-10 DIAGNOSIS — J90 Pleural effusion, not elsewhere classified: Secondary | ICD-10-CM | POA: Diagnosis not present

## 2020-09-10 DIAGNOSIS — Z7901 Long term (current) use of anticoagulants: Secondary | ICD-10-CM | POA: Diagnosis not present

## 2020-09-10 DIAGNOSIS — K219 Gastro-esophageal reflux disease without esophagitis: Secondary | ICD-10-CM | POA: Diagnosis not present

## 2020-09-11 DIAGNOSIS — Z7901 Long term (current) use of anticoagulants: Secondary | ICD-10-CM | POA: Diagnosis not present

## 2020-09-11 DIAGNOSIS — J309 Allergic rhinitis, unspecified: Secondary | ICD-10-CM | POA: Diagnosis not present

## 2020-09-11 DIAGNOSIS — K219 Gastro-esophageal reflux disease without esophagitis: Secondary | ICD-10-CM | POA: Diagnosis not present

## 2020-09-11 DIAGNOSIS — Z86010 Personal history of colonic polyps: Secondary | ICD-10-CM | POA: Diagnosis not present

## 2020-09-11 DIAGNOSIS — I4891 Unspecified atrial fibrillation: Secondary | ICD-10-CM | POA: Diagnosis not present

## 2020-09-11 DIAGNOSIS — J189 Pneumonia, unspecified organism: Secondary | ICD-10-CM | POA: Diagnosis not present

## 2020-09-11 DIAGNOSIS — M1990 Unspecified osteoarthritis, unspecified site: Secondary | ICD-10-CM | POA: Diagnosis not present

## 2020-09-11 DIAGNOSIS — I10 Essential (primary) hypertension: Secondary | ICD-10-CM | POA: Diagnosis not present

## 2020-09-11 DIAGNOSIS — Z792 Long term (current) use of antibiotics: Secondary | ICD-10-CM | POA: Diagnosis not present

## 2020-09-12 DIAGNOSIS — I10 Essential (primary) hypertension: Secondary | ICD-10-CM | POA: Diagnosis not present

## 2020-09-12 DIAGNOSIS — K219 Gastro-esophageal reflux disease without esophagitis: Secondary | ICD-10-CM | POA: Diagnosis not present

## 2020-09-12 DIAGNOSIS — J189 Pneumonia, unspecified organism: Secondary | ICD-10-CM | POA: Diagnosis not present

## 2020-09-12 DIAGNOSIS — Z86010 Personal history of colonic polyps: Secondary | ICD-10-CM | POA: Diagnosis not present

## 2020-09-12 DIAGNOSIS — Z792 Long term (current) use of antibiotics: Secondary | ICD-10-CM | POA: Diagnosis not present

## 2020-09-12 DIAGNOSIS — I4891 Unspecified atrial fibrillation: Secondary | ICD-10-CM | POA: Diagnosis not present

## 2020-09-12 DIAGNOSIS — Z7901 Long term (current) use of anticoagulants: Secondary | ICD-10-CM | POA: Diagnosis not present

## 2020-09-12 DIAGNOSIS — M1990 Unspecified osteoarthritis, unspecified site: Secondary | ICD-10-CM | POA: Diagnosis not present

## 2020-09-12 DIAGNOSIS — J309 Allergic rhinitis, unspecified: Secondary | ICD-10-CM | POA: Diagnosis not present

## 2020-09-13 DIAGNOSIS — I10 Essential (primary) hypertension: Secondary | ICD-10-CM | POA: Diagnosis not present

## 2020-09-13 DIAGNOSIS — Z86010 Personal history of colonic polyps: Secondary | ICD-10-CM | POA: Diagnosis not present

## 2020-09-13 DIAGNOSIS — J189 Pneumonia, unspecified organism: Secondary | ICD-10-CM | POA: Diagnosis not present

## 2020-09-13 DIAGNOSIS — K219 Gastro-esophageal reflux disease without esophagitis: Secondary | ICD-10-CM | POA: Diagnosis not present

## 2020-09-13 DIAGNOSIS — Z792 Long term (current) use of antibiotics: Secondary | ICD-10-CM | POA: Diagnosis not present

## 2020-09-13 DIAGNOSIS — I4891 Unspecified atrial fibrillation: Secondary | ICD-10-CM | POA: Diagnosis not present

## 2020-09-13 DIAGNOSIS — J309 Allergic rhinitis, unspecified: Secondary | ICD-10-CM | POA: Diagnosis not present

## 2020-09-13 DIAGNOSIS — M1990 Unspecified osteoarthritis, unspecified site: Secondary | ICD-10-CM | POA: Diagnosis not present

## 2020-09-13 DIAGNOSIS — Z7901 Long term (current) use of anticoagulants: Secondary | ICD-10-CM | POA: Diagnosis not present

## 2020-09-14 DIAGNOSIS — K219 Gastro-esophageal reflux disease without esophagitis: Secondary | ICD-10-CM | POA: Diagnosis not present

## 2020-09-14 DIAGNOSIS — I4891 Unspecified atrial fibrillation: Secondary | ICD-10-CM | POA: Diagnosis not present

## 2020-09-14 DIAGNOSIS — Z86010 Personal history of colonic polyps: Secondary | ICD-10-CM | POA: Diagnosis not present

## 2020-09-14 DIAGNOSIS — M1990 Unspecified osteoarthritis, unspecified site: Secondary | ICD-10-CM | POA: Diagnosis not present

## 2020-09-14 DIAGNOSIS — J309 Allergic rhinitis, unspecified: Secondary | ICD-10-CM | POA: Diagnosis not present

## 2020-09-14 DIAGNOSIS — J189 Pneumonia, unspecified organism: Secondary | ICD-10-CM | POA: Diagnosis not present

## 2020-09-14 DIAGNOSIS — Z792 Long term (current) use of antibiotics: Secondary | ICD-10-CM | POA: Diagnosis not present

## 2020-09-14 DIAGNOSIS — Z7901 Long term (current) use of anticoagulants: Secondary | ICD-10-CM | POA: Diagnosis not present

## 2020-09-14 DIAGNOSIS — I10 Essential (primary) hypertension: Secondary | ICD-10-CM | POA: Diagnosis not present

## 2020-09-17 DIAGNOSIS — J189 Pneumonia, unspecified organism: Secondary | ICD-10-CM | POA: Diagnosis not present

## 2020-09-17 DIAGNOSIS — K219 Gastro-esophageal reflux disease without esophagitis: Secondary | ICD-10-CM | POA: Diagnosis not present

## 2020-09-17 DIAGNOSIS — Z792 Long term (current) use of antibiotics: Secondary | ICD-10-CM | POA: Diagnosis not present

## 2020-09-17 DIAGNOSIS — Z7901 Long term (current) use of anticoagulants: Secondary | ICD-10-CM | POA: Diagnosis not present

## 2020-09-17 DIAGNOSIS — I4891 Unspecified atrial fibrillation: Secondary | ICD-10-CM | POA: Diagnosis not present

## 2020-09-17 DIAGNOSIS — I10 Essential (primary) hypertension: Secondary | ICD-10-CM | POA: Diagnosis not present

## 2020-09-17 DIAGNOSIS — M1990 Unspecified osteoarthritis, unspecified site: Secondary | ICD-10-CM | POA: Diagnosis not present

## 2020-09-17 DIAGNOSIS — Z86010 Personal history of colonic polyps: Secondary | ICD-10-CM | POA: Diagnosis not present

## 2020-09-17 DIAGNOSIS — J309 Allergic rhinitis, unspecified: Secondary | ICD-10-CM | POA: Diagnosis not present

## 2020-09-19 DIAGNOSIS — I10 Essential (primary) hypertension: Secondary | ICD-10-CM | POA: Diagnosis not present

## 2020-09-19 DIAGNOSIS — R609 Edema, unspecified: Secondary | ICD-10-CM | POA: Diagnosis not present

## 2020-09-19 DIAGNOSIS — R040 Epistaxis: Secondary | ICD-10-CM | POA: Diagnosis not present

## 2020-09-19 DIAGNOSIS — I4819 Other persistent atrial fibrillation: Secondary | ICD-10-CM | POA: Diagnosis not present

## 2020-09-20 DIAGNOSIS — Z86010 Personal history of colonic polyps: Secondary | ICD-10-CM | POA: Diagnosis not present

## 2020-09-20 DIAGNOSIS — Z792 Long term (current) use of antibiotics: Secondary | ICD-10-CM | POA: Diagnosis not present

## 2020-09-20 DIAGNOSIS — I4891 Unspecified atrial fibrillation: Secondary | ICD-10-CM | POA: Diagnosis not present

## 2020-09-20 DIAGNOSIS — J189 Pneumonia, unspecified organism: Secondary | ICD-10-CM | POA: Diagnosis not present

## 2020-09-20 DIAGNOSIS — I10 Essential (primary) hypertension: Secondary | ICD-10-CM | POA: Diagnosis not present

## 2020-09-20 DIAGNOSIS — Z7901 Long term (current) use of anticoagulants: Secondary | ICD-10-CM | POA: Diagnosis not present

## 2020-09-20 DIAGNOSIS — K219 Gastro-esophageal reflux disease without esophagitis: Secondary | ICD-10-CM | POA: Diagnosis not present

## 2020-09-20 DIAGNOSIS — J309 Allergic rhinitis, unspecified: Secondary | ICD-10-CM | POA: Diagnosis not present

## 2020-09-20 DIAGNOSIS — M1990 Unspecified osteoarthritis, unspecified site: Secondary | ICD-10-CM | POA: Diagnosis not present

## 2020-09-24 DIAGNOSIS — J309 Allergic rhinitis, unspecified: Secondary | ICD-10-CM | POA: Diagnosis not present

## 2020-09-24 DIAGNOSIS — Z86010 Personal history of colonic polyps: Secondary | ICD-10-CM | POA: Diagnosis not present

## 2020-09-24 DIAGNOSIS — Z792 Long term (current) use of antibiotics: Secondary | ICD-10-CM | POA: Diagnosis not present

## 2020-09-24 DIAGNOSIS — K219 Gastro-esophageal reflux disease without esophagitis: Secondary | ICD-10-CM | POA: Diagnosis not present

## 2020-09-24 DIAGNOSIS — Z7901 Long term (current) use of anticoagulants: Secondary | ICD-10-CM | POA: Diagnosis not present

## 2020-09-24 DIAGNOSIS — J189 Pneumonia, unspecified organism: Secondary | ICD-10-CM | POA: Diagnosis not present

## 2020-09-24 DIAGNOSIS — I4891 Unspecified atrial fibrillation: Secondary | ICD-10-CM | POA: Diagnosis not present

## 2020-09-24 DIAGNOSIS — M1990 Unspecified osteoarthritis, unspecified site: Secondary | ICD-10-CM | POA: Diagnosis not present

## 2020-09-24 DIAGNOSIS — I10 Essential (primary) hypertension: Secondary | ICD-10-CM | POA: Diagnosis not present

## 2020-09-25 DIAGNOSIS — Z7901 Long term (current) use of anticoagulants: Secondary | ICD-10-CM | POA: Diagnosis not present

## 2020-09-25 DIAGNOSIS — Z792 Long term (current) use of antibiotics: Secondary | ICD-10-CM | POA: Diagnosis not present

## 2020-09-25 DIAGNOSIS — I2781 Cor pulmonale (chronic): Secondary | ICD-10-CM | POA: Diagnosis not present

## 2020-09-25 DIAGNOSIS — M1990 Unspecified osteoarthritis, unspecified site: Secondary | ICD-10-CM | POA: Diagnosis not present

## 2020-09-25 DIAGNOSIS — J189 Pneumonia, unspecified organism: Secondary | ICD-10-CM | POA: Diagnosis not present

## 2020-09-25 DIAGNOSIS — J811 Chronic pulmonary edema: Secondary | ICD-10-CM | POA: Diagnosis not present

## 2020-09-25 DIAGNOSIS — Z86010 Personal history of colonic polyps: Secondary | ICD-10-CM | POA: Diagnosis not present

## 2020-09-25 DIAGNOSIS — I4891 Unspecified atrial fibrillation: Secondary | ICD-10-CM | POA: Diagnosis not present

## 2020-09-25 DIAGNOSIS — Z79899 Other long term (current) drug therapy: Secondary | ICD-10-CM | POA: Diagnosis not present

## 2020-09-25 DIAGNOSIS — I517 Cardiomegaly: Secondary | ICD-10-CM | POA: Diagnosis not present

## 2020-09-25 DIAGNOSIS — K219 Gastro-esophageal reflux disease without esophagitis: Secondary | ICD-10-CM | POA: Diagnosis not present

## 2020-09-25 DIAGNOSIS — J309 Allergic rhinitis, unspecified: Secondary | ICD-10-CM | POA: Diagnosis not present

## 2020-09-25 DIAGNOSIS — J90 Pleural effusion, not elsewhere classified: Secondary | ICD-10-CM | POA: Diagnosis not present

## 2020-09-25 DIAGNOSIS — I10 Essential (primary) hypertension: Secondary | ICD-10-CM | POA: Diagnosis not present

## 2020-09-25 DIAGNOSIS — J156 Pneumonia due to other aerobic Gram-negative bacteria: Secondary | ICD-10-CM | POA: Diagnosis not present

## 2020-09-26 DIAGNOSIS — M1990 Unspecified osteoarthritis, unspecified site: Secondary | ICD-10-CM | POA: Diagnosis not present

## 2020-09-26 DIAGNOSIS — Z86010 Personal history of colonic polyps: Secondary | ICD-10-CM | POA: Diagnosis not present

## 2020-09-26 DIAGNOSIS — Z7901 Long term (current) use of anticoagulants: Secondary | ICD-10-CM | POA: Diagnosis not present

## 2020-09-26 DIAGNOSIS — Z792 Long term (current) use of antibiotics: Secondary | ICD-10-CM | POA: Diagnosis not present

## 2020-09-26 DIAGNOSIS — I4891 Unspecified atrial fibrillation: Secondary | ICD-10-CM | POA: Diagnosis not present

## 2020-09-26 DIAGNOSIS — J309 Allergic rhinitis, unspecified: Secondary | ICD-10-CM | POA: Diagnosis not present

## 2020-09-26 DIAGNOSIS — I10 Essential (primary) hypertension: Secondary | ICD-10-CM | POA: Diagnosis not present

## 2020-09-26 DIAGNOSIS — K219 Gastro-esophageal reflux disease without esophagitis: Secondary | ICD-10-CM | POA: Diagnosis not present

## 2020-09-26 DIAGNOSIS — J189 Pneumonia, unspecified organism: Secondary | ICD-10-CM | POA: Diagnosis not present

## 2020-09-28 DIAGNOSIS — Z7901 Long term (current) use of anticoagulants: Secondary | ICD-10-CM | POA: Diagnosis not present

## 2020-09-28 DIAGNOSIS — M1990 Unspecified osteoarthritis, unspecified site: Secondary | ICD-10-CM | POA: Diagnosis not present

## 2020-09-28 DIAGNOSIS — K219 Gastro-esophageal reflux disease without esophagitis: Secondary | ICD-10-CM | POA: Diagnosis not present

## 2020-09-28 DIAGNOSIS — J189 Pneumonia, unspecified organism: Secondary | ICD-10-CM | POA: Diagnosis not present

## 2020-09-28 DIAGNOSIS — Z86010 Personal history of colonic polyps: Secondary | ICD-10-CM | POA: Diagnosis not present

## 2020-09-28 DIAGNOSIS — J309 Allergic rhinitis, unspecified: Secondary | ICD-10-CM | POA: Diagnosis not present

## 2020-09-28 DIAGNOSIS — I10 Essential (primary) hypertension: Secondary | ICD-10-CM | POA: Diagnosis not present

## 2020-09-28 DIAGNOSIS — I4891 Unspecified atrial fibrillation: Secondary | ICD-10-CM | POA: Diagnosis not present

## 2020-09-28 DIAGNOSIS — Z792 Long term (current) use of antibiotics: Secondary | ICD-10-CM | POA: Diagnosis not present

## 2020-10-02 DIAGNOSIS — J309 Allergic rhinitis, unspecified: Secondary | ICD-10-CM | POA: Diagnosis not present

## 2020-10-02 DIAGNOSIS — I10 Essential (primary) hypertension: Secondary | ICD-10-CM | POA: Diagnosis not present

## 2020-10-02 DIAGNOSIS — I4891 Unspecified atrial fibrillation: Secondary | ICD-10-CM | POA: Diagnosis not present

## 2020-10-02 DIAGNOSIS — K219 Gastro-esophageal reflux disease without esophagitis: Secondary | ICD-10-CM | POA: Diagnosis not present

## 2020-10-02 DIAGNOSIS — Z7901 Long term (current) use of anticoagulants: Secondary | ICD-10-CM | POA: Diagnosis not present

## 2020-10-02 DIAGNOSIS — Z792 Long term (current) use of antibiotics: Secondary | ICD-10-CM | POA: Diagnosis not present

## 2020-10-02 DIAGNOSIS — M1990 Unspecified osteoarthritis, unspecified site: Secondary | ICD-10-CM | POA: Diagnosis not present

## 2020-10-02 DIAGNOSIS — J189 Pneumonia, unspecified organism: Secondary | ICD-10-CM | POA: Diagnosis not present

## 2020-10-02 DIAGNOSIS — Z86010 Personal history of colonic polyps: Secondary | ICD-10-CM | POA: Diagnosis not present

## 2020-10-03 DIAGNOSIS — J189 Pneumonia, unspecified organism: Secondary | ICD-10-CM | POA: Diagnosis not present

## 2020-10-03 DIAGNOSIS — I4891 Unspecified atrial fibrillation: Secondary | ICD-10-CM | POA: Diagnosis not present

## 2020-10-03 DIAGNOSIS — Z792 Long term (current) use of antibiotics: Secondary | ICD-10-CM | POA: Diagnosis not present

## 2020-10-03 DIAGNOSIS — K219 Gastro-esophageal reflux disease without esophagitis: Secondary | ICD-10-CM | POA: Diagnosis not present

## 2020-10-03 DIAGNOSIS — M1990 Unspecified osteoarthritis, unspecified site: Secondary | ICD-10-CM | POA: Diagnosis not present

## 2020-10-03 DIAGNOSIS — J309 Allergic rhinitis, unspecified: Secondary | ICD-10-CM | POA: Diagnosis not present

## 2020-10-03 DIAGNOSIS — I10 Essential (primary) hypertension: Secondary | ICD-10-CM | POA: Diagnosis not present

## 2020-10-03 DIAGNOSIS — Z86010 Personal history of colonic polyps: Secondary | ICD-10-CM | POA: Diagnosis not present

## 2020-10-03 DIAGNOSIS — Z7901 Long term (current) use of anticoagulants: Secondary | ICD-10-CM | POA: Diagnosis not present

## 2020-10-04 DIAGNOSIS — I4891 Unspecified atrial fibrillation: Secondary | ICD-10-CM | POA: Diagnosis not present

## 2020-10-04 DIAGNOSIS — Z792 Long term (current) use of antibiotics: Secondary | ICD-10-CM | POA: Diagnosis not present

## 2020-10-04 DIAGNOSIS — M1990 Unspecified osteoarthritis, unspecified site: Secondary | ICD-10-CM | POA: Diagnosis not present

## 2020-10-04 DIAGNOSIS — K219 Gastro-esophageal reflux disease without esophagitis: Secondary | ICD-10-CM | POA: Diagnosis not present

## 2020-10-04 DIAGNOSIS — Z7901 Long term (current) use of anticoagulants: Secondary | ICD-10-CM | POA: Diagnosis not present

## 2020-10-04 DIAGNOSIS — J309 Allergic rhinitis, unspecified: Secondary | ICD-10-CM | POA: Diagnosis not present

## 2020-10-04 DIAGNOSIS — Z86010 Personal history of colonic polyps: Secondary | ICD-10-CM | POA: Diagnosis not present

## 2020-10-04 DIAGNOSIS — I10 Essential (primary) hypertension: Secondary | ICD-10-CM | POA: Diagnosis not present

## 2020-10-04 DIAGNOSIS — J189 Pneumonia, unspecified organism: Secondary | ICD-10-CM | POA: Diagnosis not present

## 2020-10-08 DIAGNOSIS — I10 Essential (primary) hypertension: Secondary | ICD-10-CM | POA: Diagnosis not present

## 2020-10-08 DIAGNOSIS — I4891 Unspecified atrial fibrillation: Secondary | ICD-10-CM | POA: Diagnosis not present

## 2020-10-08 DIAGNOSIS — Z86010 Personal history of colonic polyps: Secondary | ICD-10-CM | POA: Diagnosis not present

## 2020-10-08 DIAGNOSIS — M1990 Unspecified osteoarthritis, unspecified site: Secondary | ICD-10-CM | POA: Diagnosis not present

## 2020-10-08 DIAGNOSIS — J309 Allergic rhinitis, unspecified: Secondary | ICD-10-CM | POA: Diagnosis not present

## 2020-10-08 DIAGNOSIS — Z7901 Long term (current) use of anticoagulants: Secondary | ICD-10-CM | POA: Diagnosis not present

## 2020-10-08 DIAGNOSIS — K219 Gastro-esophageal reflux disease without esophagitis: Secondary | ICD-10-CM | POA: Diagnosis not present

## 2020-10-08 DIAGNOSIS — J189 Pneumonia, unspecified organism: Secondary | ICD-10-CM | POA: Diagnosis not present

## 2020-10-08 DIAGNOSIS — Z792 Long term (current) use of antibiotics: Secondary | ICD-10-CM | POA: Diagnosis not present

## 2020-10-11 DIAGNOSIS — J189 Pneumonia, unspecified organism: Secondary | ICD-10-CM | POA: Diagnosis not present

## 2020-10-11 DIAGNOSIS — Z7901 Long term (current) use of anticoagulants: Secondary | ICD-10-CM | POA: Diagnosis not present

## 2020-10-11 DIAGNOSIS — J309 Allergic rhinitis, unspecified: Secondary | ICD-10-CM | POA: Diagnosis not present

## 2020-10-11 DIAGNOSIS — Z792 Long term (current) use of antibiotics: Secondary | ICD-10-CM | POA: Diagnosis not present

## 2020-10-11 DIAGNOSIS — I4891 Unspecified atrial fibrillation: Secondary | ICD-10-CM | POA: Diagnosis not present

## 2020-10-11 DIAGNOSIS — Z86010 Personal history of colonic polyps: Secondary | ICD-10-CM | POA: Diagnosis not present

## 2020-10-11 DIAGNOSIS — K219 Gastro-esophageal reflux disease without esophagitis: Secondary | ICD-10-CM | POA: Diagnosis not present

## 2020-10-11 DIAGNOSIS — M1990 Unspecified osteoarthritis, unspecified site: Secondary | ICD-10-CM | POA: Diagnosis not present

## 2020-10-11 DIAGNOSIS — I10 Essential (primary) hypertension: Secondary | ICD-10-CM | POA: Diagnosis not present

## 2020-10-19 DIAGNOSIS — I10 Essential (primary) hypertension: Secondary | ICD-10-CM | POA: Diagnosis not present

## 2020-10-19 DIAGNOSIS — Z792 Long term (current) use of antibiotics: Secondary | ICD-10-CM | POA: Diagnosis not present

## 2020-10-19 DIAGNOSIS — J189 Pneumonia, unspecified organism: Secondary | ICD-10-CM | POA: Diagnosis not present

## 2020-10-19 DIAGNOSIS — K219 Gastro-esophageal reflux disease without esophagitis: Secondary | ICD-10-CM | POA: Diagnosis not present

## 2020-10-19 DIAGNOSIS — Z7901 Long term (current) use of anticoagulants: Secondary | ICD-10-CM | POA: Diagnosis not present

## 2020-10-19 DIAGNOSIS — M1990 Unspecified osteoarthritis, unspecified site: Secondary | ICD-10-CM | POA: Diagnosis not present

## 2020-10-19 DIAGNOSIS — Z86010 Personal history of colonic polyps: Secondary | ICD-10-CM | POA: Diagnosis not present

## 2020-10-19 DIAGNOSIS — I4891 Unspecified atrial fibrillation: Secondary | ICD-10-CM | POA: Diagnosis not present

## 2020-10-19 DIAGNOSIS — J309 Allergic rhinitis, unspecified: Secondary | ICD-10-CM | POA: Diagnosis not present

## 2020-10-25 DIAGNOSIS — I10 Essential (primary) hypertension: Secondary | ICD-10-CM | POA: Diagnosis not present

## 2020-10-25 DIAGNOSIS — I739 Peripheral vascular disease, unspecified: Secondary | ICD-10-CM | POA: Diagnosis not present

## 2020-10-25 DIAGNOSIS — I5032 Chronic diastolic (congestive) heart failure: Secondary | ICD-10-CM | POA: Diagnosis not present

## 2020-10-25 DIAGNOSIS — I4892 Unspecified atrial flutter: Secondary | ICD-10-CM | POA: Diagnosis not present

## 2020-10-25 DIAGNOSIS — I4891 Unspecified atrial fibrillation: Secondary | ICD-10-CM | POA: Diagnosis not present

## 2020-10-25 DIAGNOSIS — I4819 Other persistent atrial fibrillation: Secondary | ICD-10-CM | POA: Diagnosis not present

## 2020-10-25 DIAGNOSIS — R609 Edema, unspecified: Secondary | ICD-10-CM | POA: Diagnosis not present

## 2020-10-25 DIAGNOSIS — R600 Localized edema: Secondary | ICD-10-CM | POA: Diagnosis not present

## 2020-11-02 DIAGNOSIS — J309 Allergic rhinitis, unspecified: Secondary | ICD-10-CM | POA: Diagnosis not present

## 2020-11-02 DIAGNOSIS — Z7901 Long term (current) use of anticoagulants: Secondary | ICD-10-CM | POA: Diagnosis not present

## 2020-11-02 DIAGNOSIS — I10 Essential (primary) hypertension: Secondary | ICD-10-CM | POA: Diagnosis not present

## 2020-11-02 DIAGNOSIS — I4891 Unspecified atrial fibrillation: Secondary | ICD-10-CM | POA: Diagnosis not present

## 2020-11-02 DIAGNOSIS — J189 Pneumonia, unspecified organism: Secondary | ICD-10-CM | POA: Diagnosis not present

## 2020-11-02 DIAGNOSIS — M1990 Unspecified osteoarthritis, unspecified site: Secondary | ICD-10-CM | POA: Diagnosis not present

## 2020-11-02 DIAGNOSIS — Z792 Long term (current) use of antibiotics: Secondary | ICD-10-CM | POA: Diagnosis not present

## 2020-11-02 DIAGNOSIS — K219 Gastro-esophageal reflux disease without esophagitis: Secondary | ICD-10-CM | POA: Diagnosis not present

## 2020-11-02 DIAGNOSIS — Z86010 Personal history of colonic polyps: Secondary | ICD-10-CM | POA: Diagnosis not present

## 2020-11-13 DIAGNOSIS — F32A Depression, unspecified: Secondary | ICD-10-CM | POA: Diagnosis not present

## 2020-11-13 DIAGNOSIS — Z0000 Encounter for general adult medical examination without abnormal findings: Secondary | ICD-10-CM | POA: Diagnosis not present

## 2020-11-13 DIAGNOSIS — J449 Chronic obstructive pulmonary disease, unspecified: Secondary | ICD-10-CM | POA: Diagnosis not present

## 2020-11-13 DIAGNOSIS — I509 Heart failure, unspecified: Secondary | ICD-10-CM | POA: Diagnosis not present

## 2020-11-13 DIAGNOSIS — I482 Chronic atrial fibrillation, unspecified: Secondary | ICD-10-CM | POA: Diagnosis not present

## 2020-12-20 DIAGNOSIS — J4540 Moderate persistent asthma, uncomplicated: Secondary | ICD-10-CM | POA: Diagnosis not present

## 2020-12-20 DIAGNOSIS — I5032 Chronic diastolic (congestive) heart failure: Secondary | ICD-10-CM | POA: Diagnosis not present

## 2020-12-20 DIAGNOSIS — R059 Cough, unspecified: Secondary | ICD-10-CM | POA: Diagnosis not present

## 2020-12-20 DIAGNOSIS — J069 Acute upper respiratory infection, unspecified: Secondary | ICD-10-CM | POA: Diagnosis not present

## 2020-12-20 DIAGNOSIS — Z03818 Encounter for observation for suspected exposure to other biological agents ruled out: Secondary | ICD-10-CM | POA: Diagnosis not present

## 2020-12-20 DIAGNOSIS — R058 Other specified cough: Secondary | ICD-10-CM | POA: Diagnosis not present

## 2020-12-25 DIAGNOSIS — J189 Pneumonia, unspecified organism: Secondary | ICD-10-CM | POA: Diagnosis not present

## 2020-12-31 DIAGNOSIS — J189 Pneumonia, unspecified organism: Secondary | ICD-10-CM | POA: Diagnosis not present

## 2020-12-31 DIAGNOSIS — I517 Cardiomegaly: Secondary | ICD-10-CM | POA: Diagnosis not present

## 2021-01-14 DIAGNOSIS — Z961 Presence of intraocular lens: Secondary | ICD-10-CM | POA: Diagnosis not present

## 2021-03-11 DIAGNOSIS — I5032 Chronic diastolic (congestive) heart failure: Secondary | ICD-10-CM | POA: Diagnosis not present

## 2021-03-11 DIAGNOSIS — R300 Dysuria: Secondary | ICD-10-CM | POA: Diagnosis not present

## 2021-03-15 DIAGNOSIS — R600 Localized edema: Secondary | ICD-10-CM | POA: Diagnosis not present

## 2021-03-15 DIAGNOSIS — I5032 Chronic diastolic (congestive) heart failure: Secondary | ICD-10-CM | POA: Diagnosis not present

## 2021-03-15 DIAGNOSIS — I4819 Other persistent atrial fibrillation: Secondary | ICD-10-CM | POA: Diagnosis not present

## 2021-03-20 DIAGNOSIS — Z79899 Other long term (current) drug therapy: Secondary | ICD-10-CM | POA: Diagnosis not present

## 2021-03-20 DIAGNOSIS — Z0000 Encounter for general adult medical examination without abnormal findings: Secondary | ICD-10-CM | POA: Diagnosis not present

## 2021-03-20 DIAGNOSIS — I5030 Unspecified diastolic (congestive) heart failure: Secondary | ICD-10-CM | POA: Diagnosis not present

## 2021-03-20 DIAGNOSIS — I4891 Unspecified atrial fibrillation: Secondary | ICD-10-CM | POA: Diagnosis not present

## 2021-03-27 DIAGNOSIS — J40 Bronchitis, not specified as acute or chronic: Secondary | ICD-10-CM | POA: Diagnosis not present

## 2021-03-27 DIAGNOSIS — I509 Heart failure, unspecified: Secondary | ICD-10-CM | POA: Diagnosis not present

## 2021-06-13 DIAGNOSIS — I4819 Other persistent atrial fibrillation: Secondary | ICD-10-CM | POA: Diagnosis not present

## 2021-06-13 DIAGNOSIS — I10 Essential (primary) hypertension: Secondary | ICD-10-CM | POA: Diagnosis not present

## 2021-06-13 DIAGNOSIS — I5032 Chronic diastolic (congestive) heart failure: Secondary | ICD-10-CM | POA: Diagnosis not present

## 2021-08-28 ENCOUNTER — Emergency Department: Payer: Medicare HMO

## 2021-08-28 ENCOUNTER — Other Ambulatory Visit: Payer: Self-pay

## 2021-08-28 ENCOUNTER — Inpatient Hospital Stay
Admission: EM | Admit: 2021-08-28 | Discharge: 2021-09-06 | DRG: 246 | Disposition: A | Discharge status: Skilled Nursing Facility | Payer: Medicare HMO | Attending: Internal Medicine | Admitting: Internal Medicine

## 2021-08-28 DIAGNOSIS — Z882 Allergy status to sulfonamides status: Secondary | ICD-10-CM

## 2021-08-28 DIAGNOSIS — R0689 Other abnormalities of breathing: Secondary | ICD-10-CM | POA: Diagnosis not present

## 2021-08-28 DIAGNOSIS — J9601 Acute respiratory failure with hypoxia: Secondary | ICD-10-CM | POA: Diagnosis present

## 2021-08-28 DIAGNOSIS — Z20822 Contact with and (suspected) exposure to covid-19: Secondary | ICD-10-CM | POA: Diagnosis present

## 2021-08-28 DIAGNOSIS — I5023 Acute on chronic systolic (congestive) heart failure: Secondary | ICD-10-CM | POA: Diagnosis present

## 2021-08-28 DIAGNOSIS — I214 Non-ST elevation (NSTEMI) myocardial infarction: Principal | ICD-10-CM

## 2021-08-28 DIAGNOSIS — Z8249 Family history of ischemic heart disease and other diseases of the circulatory system: Secondary | ICD-10-CM

## 2021-08-28 DIAGNOSIS — I517 Cardiomegaly: Secondary | ICD-10-CM | POA: Diagnosis not present

## 2021-08-28 DIAGNOSIS — E876 Hypokalemia: Secondary | ICD-10-CM | POA: Diagnosis not present

## 2021-08-28 DIAGNOSIS — R197 Diarrhea, unspecified: Secondary | ICD-10-CM | POA: Diagnosis present

## 2021-08-28 DIAGNOSIS — M1990 Unspecified osteoarthritis, unspecified site: Secondary | ICD-10-CM | POA: Diagnosis present

## 2021-08-28 DIAGNOSIS — Z880 Allergy status to penicillin: Secondary | ICD-10-CM | POA: Diagnosis not present

## 2021-08-28 DIAGNOSIS — T502X5A Adverse effect of carbonic-anhydrase inhibitors, benzothiadiazides and other diuretics, initial encounter: Secondary | ICD-10-CM | POA: Diagnosis present

## 2021-08-28 DIAGNOSIS — F32A Depression, unspecified: Secondary | ICD-10-CM | POA: Diagnosis not present

## 2021-08-28 DIAGNOSIS — I110 Hypertensive heart disease with heart failure: Secondary | ICD-10-CM | POA: Diagnosis present

## 2021-08-28 DIAGNOSIS — K219 Gastro-esophageal reflux disease without esophagitis: Secondary | ICD-10-CM

## 2021-08-28 DIAGNOSIS — I2510 Atherosclerotic heart disease of native coronary artery without angina pectoris: Secondary | ICD-10-CM | POA: Diagnosis present

## 2021-08-28 DIAGNOSIS — I5022 Chronic systolic (congestive) heart failure: Secondary | ICD-10-CM | POA: Diagnosis not present

## 2021-08-28 DIAGNOSIS — Z888 Allergy status to other drugs, medicaments and biological substances status: Secondary | ICD-10-CM

## 2021-08-28 DIAGNOSIS — Z79899 Other long term (current) drug therapy: Secondary | ICD-10-CM | POA: Diagnosis not present

## 2021-08-28 DIAGNOSIS — J440 Chronic obstructive pulmonary disease with acute lower respiratory infection: Secondary | ICD-10-CM | POA: Diagnosis present

## 2021-08-28 DIAGNOSIS — E440 Moderate protein-calorie malnutrition: Secondary | ICD-10-CM | POA: Diagnosis not present

## 2021-08-28 DIAGNOSIS — R0602 Shortness of breath: Secondary | ICD-10-CM

## 2021-08-28 DIAGNOSIS — I5033 Acute on chronic diastolic (congestive) heart failure: Secondary | ICD-10-CM

## 2021-08-28 DIAGNOSIS — E871 Hypo-osmolality and hyponatremia: Secondary | ICD-10-CM | POA: Diagnosis not present

## 2021-08-28 DIAGNOSIS — Z9861 Coronary angioplasty status: Secondary | ICD-10-CM | POA: Diagnosis not present

## 2021-08-28 DIAGNOSIS — Z7901 Long term (current) use of anticoagulants: Secondary | ICD-10-CM | POA: Diagnosis not present

## 2021-08-28 DIAGNOSIS — I509 Heart failure, unspecified: Secondary | ICD-10-CM | POA: Diagnosis not present

## 2021-08-28 DIAGNOSIS — E785 Hyperlipidemia, unspecified: Secondary | ICD-10-CM | POA: Diagnosis present

## 2021-08-28 DIAGNOSIS — J189 Pneumonia, unspecified organism: Secondary | ICD-10-CM | POA: Diagnosis present

## 2021-08-28 DIAGNOSIS — I4891 Unspecified atrial fibrillation: Secondary | ICD-10-CM

## 2021-08-28 DIAGNOSIS — Z7401 Bed confinement status: Secondary | ICD-10-CM | POA: Diagnosis not present

## 2021-08-28 DIAGNOSIS — Z66 Do not resuscitate: Secondary | ICD-10-CM | POA: Diagnosis not present

## 2021-08-28 DIAGNOSIS — R0902 Hypoxemia: Secondary | ICD-10-CM | POA: Diagnosis not present

## 2021-08-28 DIAGNOSIS — R531 Weakness: Secondary | ICD-10-CM | POA: Diagnosis not present

## 2021-08-28 DIAGNOSIS — I5021 Acute systolic (congestive) heart failure: Secondary | ICD-10-CM | POA: Diagnosis present

## 2021-08-28 DIAGNOSIS — I959 Hypotension, unspecified: Secondary | ICD-10-CM | POA: Diagnosis not present

## 2021-08-28 DIAGNOSIS — J9811 Atelectasis: Secondary | ICD-10-CM | POA: Diagnosis not present

## 2021-08-28 LAB — CBC WITH DIFFERENTIAL/PLATELET
Abs Immature Granulocytes: 0.03 10*3/uL (ref 0.00–0.07)
Basophils Absolute: 0 10*3/uL (ref 0.0–0.1)
Basophils Relative: 0 %
Eosinophils Absolute: 0 10*3/uL (ref 0.0–0.5)
Eosinophils Relative: 0 %
HCT: 41.5 % (ref 36.0–46.0)
Hemoglobin: 13.4 g/dL (ref 12.0–15.0)
Immature Granulocytes: 0 %
Lymphocytes Relative: 18 %
Lymphs Abs: 1.4 10*3/uL (ref 0.7–4.0)
MCH: 30.7 pg (ref 26.0–34.0)
MCHC: 32.3 g/dL (ref 30.0–36.0)
MCV: 95 fL (ref 80.0–100.0)
Monocytes Absolute: 0.9 10*3/uL (ref 0.1–1.0)
Monocytes Relative: 12 %
Neutro Abs: 5.5 10*3/uL (ref 1.7–7.7)
Neutrophils Relative %: 70 %
Platelets: 210 10*3/uL (ref 150–400)
RBC: 4.37 MIL/uL (ref 3.87–5.11)
RDW: 14 % (ref 11.5–15.5)
WBC: 8 10*3/uL (ref 4.0–10.5)
nRBC: 0 % (ref 0.0–0.2)

## 2021-08-28 LAB — COMPREHENSIVE METABOLIC PANEL
ALT: 27 U/L (ref 0–44)
AST: 40 U/L (ref 15–41)
Albumin: 3.6 g/dL (ref 3.5–5.0)
Alkaline Phosphatase: 60 U/L (ref 38–126)
Anion gap: 7 (ref 5–15)
BUN: 31 mg/dL — ABNORMAL HIGH (ref 8–23)
CO2: 24 mmol/L (ref 22–32)
Calcium: 9.1 mg/dL (ref 8.9–10.3)
Chloride: 101 mmol/L (ref 98–111)
Creatinine, Ser: 0.88 mg/dL (ref 0.44–1.00)
GFR, Estimated: >60 mL/min (ref >60)
Glucose, Bld: 115 mg/dL — ABNORMAL HIGH (ref 70–99)
Potassium: 3.8 mmol/L (ref 3.5–5.1)
Sodium: 132 mmol/L — ABNORMAL LOW (ref 135–145)
Total Bilirubin: 0.7 mg/dL (ref 0.3–1.2)
Total Protein: 6.7 g/dL (ref 6.5–8.1)

## 2021-08-28 LAB — PROCALCITONIN: Procalcitonin: 0.14 ng/mL

## 2021-08-28 LAB — RESP PANEL BY RT-PCR (FLU A&B, COVID) ARPGX2
Influenza A by PCR: NEGATIVE
Influenza B by PCR: NEGATIVE
SARS Coronavirus 2 by RT PCR: NEGATIVE

## 2021-08-28 LAB — LACTIC ACID, PLASMA: Lactic Acid, Venous: 1.5 mmol/L (ref 0.5–1.9)

## 2021-08-28 LAB — TROPONIN I (HIGH SENSITIVITY)
Troponin I (High Sensitivity): 1478 ng/L (ref <18)
Troponin I (High Sensitivity): 1554 ng/L (ref <18)

## 2021-08-28 LAB — BRAIN NATRIURETIC PEPTIDE: B Natriuretic Peptide: 1095 pg/mL — ABNORMAL HIGH (ref 0.0–100.0)

## 2021-08-28 MED ORDER — IOHEXOL 350 MG/ML SOLN
75.0000 mL | Freq: Once | INTRAVENOUS | Status: AC | PRN
  Administered 2021-08-28: 75 mL via INTRAVENOUS

## 2021-08-28 MED ORDER — ASPIRIN 81 MG PO CHEW
324.0000 mg | CHEWABLE_TABLET | Freq: Once | ORAL | Status: AC
  Administered 2021-08-28: 324 mg via ORAL
  Filled 2021-08-28: qty 4

## 2021-08-28 MED ORDER — HEPARIN (PORCINE) 25000 UT/250ML-% IV SOLN
INTRAVENOUS | Status: AC
  Administered 2021-08-29: 900 [IU]/h via INTRAVENOUS
  Filled 2021-08-28: qty 250

## 2021-08-28 MED ORDER — DILTIAZEM HCL 25 MG/5ML IV SOLN
10.0000 mg | Freq: Once | INTRAVENOUS | Status: AC
  Administered 2021-08-29: 10 mg via INTRAVENOUS
  Filled 2021-08-28: qty 5

## 2021-08-28 MED ORDER — FUROSEMIDE 10 MG/ML IJ SOLN
20.0000 mg | Freq: Once | INTRAMUSCULAR | Status: AC
  Administered 2021-08-29: 20 mg via INTRAVENOUS
  Filled 2021-08-28: qty 4

## 2021-08-28 MED ORDER — HEPARIN SODIUM (PORCINE) 5000 UNIT/ML IJ SOLN
4000.0000 [IU] | Freq: Once | INTRAMUSCULAR | Status: DC
  Filled 2021-08-28: qty 1

## 2021-08-28 MED ORDER — SODIUM CHLORIDE 0.9 % IV BOLUS: 1000.0000 mL | Freq: Once | INTRAVENOUS | Status: DC

## 2021-08-28 MED ORDER — HEPARIN (PORCINE) 25000 UT/250ML-% IV SOLN: 14.0000 [IU]/kg/h | INTRAVENOUS | Status: DC

## 2021-08-28 NOTE — ED Notes (Signed)
Patient transported to CT 

## 2021-08-28 NOTE — ED Notes (Signed)
Reviewed pt's triage--orders added

## 2021-08-28 NOTE — ED Provider Notes (Signed)
[facility]  ____________________________________________   Event Date/Time   First MD Initiated Contact with Patient 08/28/21 2153     (approximate)  I have reviewed the triage vital signs and the nursing notes.   HISTORY  Chief Complaint Shortness of Breath    HPI [name] is a 87 y.o. female with a past medical history of GERD, hypertension who presents with shortness of breath and cough.  Symptoms started on Sunday, approximately 4 days ago.  She has had cough productive of brown sputum as well as significant dyspnea.  She denies chest pain and denies fevers or chills.  Has also had some generalized abdominal discomfort and diarrhea after eating anything.  Has nausea but no vomiting.  She denies urinary symptoms.  No sick contacts.  Patient has chronic lower extreme edema which she says is unchanged from prior.         Past Medical History:  Diagnosis Date   Allergic rhinitis    Arthritis    Colon polyp    Edema    Esophageal spasm    GERD (gastroesophageal reflux disease)    Hypertension     Patient Active Problem List   Diagnosis Date Noted   AKI (acute kidney injury) ([place])    Hypoxia    RLL pneumonia 08/27/2020   Acute respiratory failure with hypoxia ([place]) 08/27/2020   Hyponatremia 08/27/2020   Community acquired pneumonia 08/27/2020   Atrial fibrillation with RVR ([place]) 08/27/2020   DNR (do not resuscitate)/DNI(Do Not Intubate) 08/27/2020   SOB (shortness of breath) 06/30/2018   GERD (gastroesophageal reflux disease) 04/29/2018   Hypertension 03/02/2018   Arthritis 03/02/2018   Pain in limb 03/02/2018   HTN (hypertension), benign 01/08/2014    Past Surgical History:  Procedure Laterality Date   APPENDECTOMY     CATARACT EXTRACTION     dilatation and curatage     EYE SURGERY     HYSTEROTOMY     OOPHORECTOMY     TONSILLECTOMY      Prior to Admission medications   Medication Sig Start Date End Date Taking? Authorizing  Provider  apixaban (ELIQUIS) 5 MG TABS tablet Take 1 tablet (5 mg total) by mouth 2 (two) times daily. 09/03/20   Lorella Nimrod, MD  Calcium Carbonate-Vitamin D (CALCIUM HIGH POTENCY/VITAMIN D) 600-200 MG-UNIT TABS Take 2 tablets by mouth daily.     [provider]  carboxymethylcellul-glycerin (OPTIVE) 0.5-0.9 % ophthalmic solution Place 1 drop into both eyes 4 (four) times daily as needed for dry eyes.    [provider]  cetirizine (ZYRTEC) 10 MG tablet Take 10 mg by mouth at bedtime.    [provider]  dextromethorphan-guaiFENesin (MUCINEX DM) 30-600 MG 12hr tablet Take 1 tablet by mouth 2 (two) times daily. 09/03/20   Lorella Nimrod, MD  diltiazem (CARTIA XT) 240 MG 24 hr capsule Take 240 mg by mouth daily.  02/02/18   [provider]  escitalopram (LEXAPRO) 5 MG tablet Take 5 mg by mouth in the morning and at bedtime.  02/02/18   [provider]  fluticasone (FLONASE) 50 MCG/ACT nasal spray Place 1 spray into the nose daily as needed.  09/09/16   [provider]  furosemide (LASIX) 20 MG tablet Take 20 mg by mouth daily.  08/20/20   [provider]  ipratropium-albuterol (DUONEB) 0.5-2.5 (3) MG/3ML SOLN Inhale 3 mLs into the lungs 3 (three) times daily. 07/06/20   [provider]  metoprolol tartrate (LOPRESSOR) 25 MG  tablet Take 1 tablet (25 mg total) by mouth 2 (two) times daily. 09/03/20   Lorella Nimrod, MD  omeprazole (PRILOSEC) 20 MG capsule Take 20 mg by mouth daily.    [provider]  potassium chloride SA (KLOR-CON) 20 MEQ tablet Take 20 mEq by mouth 3 (three) times daily. 06/25/20   [provider]    Allergies Amoxicillin-pot clavulanate, Cefprozil, Clarithromycin, Prednisone, Pseudoephedrine, Penicillins, and Sulfa antibiotics  Family History  Problem Relation Age of Onset   Dementia Mother    Heart attack Father     Social History Social History   Tobacco Use   Smoking status: Never    Smokeless tobacco: Never  Substance Use Topics   Alcohol use: Never   Drug use: Never    Review of Systems   Review of Systems  Constitutional:  Negative for chills and fever.  Respiratory:  Positive for cough and shortness of breath. Negative for chest tightness.   Cardiovascular:  Negative for chest pain.  Gastrointestinal:  Positive for abdominal pain, diarrhea and nausea. Negative for vomiting.  Genitourinary:  Negative for dysuria.  All other systems reviewed and are negative.  Physical Exam Updated Vital Signs BP (!) 144/99   Pulse (!) 129   Temp 98.4 F (36.9 C) (Oral)   Resp (!) 35   Ht 5' 5.5" (1.664 m)   Wt 70.3 kg   SpO2 98%   BMI 25.40 kg/m   Physical Exam Vitals and nursing note reviewed.  Constitutional:      General: She is not in acute distress.    Appearance: Normal appearance.  HENT:     Head: Normocephalic and atraumatic.  Eyes:     General: No scleral icterus.    Conjunctiva/sclera: Conjunctivae normal.  Cardiovascular:     Rate and Rhythm: Tachycardia present. Rhythm irregular.  Pulmonary:     Breath sounds: Normal breath sounds.     Comments: Patient appears dyspneic, but able to speak in normal sentences Oddly tachypneic She has a loud wet cough Lungs are rhonchorous throughout Musculoskeletal:        General: No deformity or signs of injury.     Cervical back: Normal range of motion.     Right lower leg: Edema present.     Left lower leg: Edema present.     Comments: Bilateral extremity extremity edema  Skin:    General: Skin is dry.     Coloration: Skin is not jaundiced or pale.  Neurological:     General: No focal deficit present.     Mental Status: She is alert and oriented to person, place, and time. Mental status is at baseline.  Psychiatric:        Mood and Affect: Mood normal.        Behavior: Behavior normal.     LABS (all labs ordered are listed, but only abnormal results are displayed)  Labs Reviewed  COMPREHENSIVE  METABOLIC PANEL - Abnormal; Notable for the following components:      Result Value   Sodium 132 (*)    Glucose, Bld 115 (*)    BUN 31 (*)    All other components within normal limits  BRAIN NATRIURETIC PEPTIDE - Abnormal; Notable for the following components:   B Natriuretic Peptide 1,095.0 (*)    All other components within normal limits  TROPONIN I (HIGH SENSITIVITY) - Abnormal; Notable for the following components:   Troponin I (High Sensitivity) 1,478 (*)    All other components within normal  limits  TROPONIN I (HIGH SENSITIVITY) - Abnormal; Notable for the following components:   Troponin I (High Sensitivity) 1,554 (*)    All other components within normal limits  RESP PANEL BY RT-PCR (FLU A&B, COVID) ARPGX2  CBC WITH DIFFERENTIAL/PLATELET  PROCALCITONIN  URINALYSIS, ROUTINE W REFLEX MICROSCOPIC  LACTIC ACID, PLASMA   ____________________________________________  EKG  Atrial fibrillation, heart rate 101, normal axis, normal intervals, no acute ischemic changes ____________________________________________  RADIOLOGY Almeta Monas, personally viewed and evaluated these images (plain radiographs) as part of my medical decision making, as well as reviewing the written report by the radiologist.  ED MD interpretation: CT angio pending    ____________________________________________   PROCEDURES  Procedure(s) performed (including Critical Care):  .1-3 Lead EKG Interpretation Performed by: Rada Hay, MD Authorized by: Rada Hay, MD     Interpretation: abnormal     ECG rate assessment: tachycardic     Rhythm: atrial fibrillation     Ectopy: none     Conduction: normal     ____________________________________________   INITIAL IMPRESSION / ASSESSMENT AND PLAN / ED COURSE  Patient is a 87 year old female presenting with cough shortness of breath.  She has a history of A. fib and heart rates are mildly elevated in the 1 teens to 130s.  She  is tachypneic but not hypoxic.  Patient appears dyspneic and she has a very loud wet cough and her lungs sound very rhonchorous.  She denies chest pain but does have dyspnea.  Her EKG shows A. fib with a rather controlled rate and no ischemic changes.  Her troponin is notably elevated around 1400.  She denies ischemic chest pain.  We will give an aspirin and repeat the troponin.  Her chest x-ray does not show any obvious focal infiltrate and her flu and COVID are negative.  She has no leukocytosis.  Given the significant elevation in troponin with her dyspnea and concern for pulmonary embolism however would expect to see more hypoxia.  We will obtain a CT angio both to evaluate the parenchyma for any focal infiltrate and to rule out PE.  We will give her a liter of fluids and check a lactate.  We will hold on rate control at this time as I suspect that this is in the setting of her acute illness.  Patient will require admission.   Patient is pending results of CTA imaging at the time of signout.  ____________________________________________   FINAL CLINICAL IMPRESSION(S) / ED DIAGNOSES  Final diagnoses:  NSTEMI (non-ST elevated myocardial infarction) ([place])  Shortness of breath     ED Discharge Orders     None        Note:  This document was prepared using Dragon voice recognition software and may include unintentional dictation errors.    Rada Hay, MD 08/28/21 [phone]

## 2021-08-28 NOTE — ED Triage Notes (Signed)
Pt to ED [facility] from home for shob since Tuesday and cough. Alert and oriented. NAD noted Productive cough noted

## 2021-08-28 NOTE — ED Notes (Signed)
Pt to room 6 via w/c; assisted into hosp gown & on card monitor; Dr Starleen Blue and care nurse Deberah Pelton RN aware of pt's cc and results

## 2021-08-28 NOTE — ED Triage Notes (Signed)
Pt comes into the ED via EMS from home with c/o weakness, on abx of URI for the past 2 days, cough, congestion, diarrhea for 3 weeks,  CBG194 134/78 HR78-120 a-fib w/hx 96%RA

## 2021-08-28 NOTE — ED Provider Notes (Signed)
-----------------------------------------   11:46 PM on 08/28/2021 -----------------------------------------   CTA chest interpreted per Dr. Francoise Ceo:  1. Negative for acute pulmonary embolus.  2. Chronic lung disease and sequelae of prior infection most evident  in the right upper and middle lobes. No definite acute superimposed  airspace disease.  3. Mild mediastinal adenopathy nonspecific, possibly reactive.   Heart rate 120s-140s atrial fibrillation, BNP > 1000, troponin > 1550 will administer cardiac bolus for rate control, start diuresis with Lasix and initiate heparin bolus with drip.  Will discuss with hospitalist services for admission.   Paulette Blanch, MD 08/29/21 [phone]

## 2021-08-28 NOTE — ED Notes (Signed)
Lab reports troponin 1478; acuity level changed, charge nurse notified

## 2021-08-29 ENCOUNTER — Inpatient Hospital Stay
Admit: 2021-08-29 | Discharge: 2021-08-29 | Disposition: A | Discharge status: Home / Self Care | Payer: Medicare HMO | Attending: Family Medicine | Admitting: Family Medicine

## 2021-08-29 ENCOUNTER — Other Ambulatory Visit: Payer: Self-pay

## 2021-08-29 ENCOUNTER — Encounter: Payer: Self-pay | Admitting: Family Medicine

## 2021-08-29 DIAGNOSIS — F32A Depression, unspecified: Secondary | ICD-10-CM | POA: Diagnosis present

## 2021-08-29 DIAGNOSIS — I214 Non-ST elevation (NSTEMI) myocardial infarction: Secondary | ICD-10-CM | POA: Diagnosis not present

## 2021-08-29 DIAGNOSIS — E876 Hypokalemia: Secondary | ICD-10-CM | POA: Diagnosis not present

## 2021-08-29 DIAGNOSIS — R197 Diarrhea, unspecified: Secondary | ICD-10-CM | POA: Diagnosis present

## 2021-08-29 LAB — CBC
HCT: 38.2 % (ref 36.0–46.0)
Hemoglobin: 12.4 g/dL (ref 12.0–15.0)
MCH: 30.5 pg (ref 26.0–34.0)
MCHC: 32.5 g/dL (ref 30.0–36.0)
MCV: 94.1 fL (ref 80.0–100.0)
Platelets: 185 10*3/uL (ref 150–400)
RBC: 4.06 MIL/uL (ref 3.87–5.11)
RDW: 14.1 % (ref 11.5–15.5)
WBC: 6.6 10*3/uL (ref 4.0–10.5)
nRBC: 0 % (ref 0.0–0.2)

## 2021-08-29 LAB — URINALYSIS, ROUTINE W REFLEX MICROSCOPIC
Bilirubin Urine: NEGATIVE
Glucose, UA: NEGATIVE mg/dL
Leukocytes,Ua: NEGATIVE
Nitrite: NEGATIVE
Protein, ur: 30 mg/dL — AB
Specific Gravity, Urine: 1.01 (ref 1.005–1.030)
pH: 6 (ref 5.0–8.0)

## 2021-08-29 LAB — BASIC METABOLIC PANEL
Anion gap: 7 (ref 5–15)
BUN: 23 mg/dL (ref 8–23)
CO2: 22 mmol/L (ref 22–32)
Calcium: 8.3 mg/dL — ABNORMAL LOW (ref 8.9–10.3)
Chloride: 106 mmol/L (ref 98–111)
Creatinine, Ser: 0.79 mg/dL (ref 0.44–1.00)
GFR, Estimated: >60 mL/min (ref >60)
Glucose, Bld: 89 mg/dL (ref 70–99)
Potassium: 3.1 mmol/L — ABNORMAL LOW (ref 3.5–5.1)
Sodium: 135 mmol/L (ref 135–145)

## 2021-08-29 LAB — LIPID PANEL
Cholesterol: 124 mg/dL (ref 0–200)
HDL: 36 mg/dL — ABNORMAL LOW (ref >40)
LDL Cholesterol: 67 mg/dL (ref 0–99)
Total CHOL/HDL Ratio: 3.4 RATIO
Triglycerides: 107 mg/dL (ref <150)
VLDL: 21 mg/dL (ref 0–40)

## 2021-08-29 LAB — APTT
aPTT: 33 seconds (ref 24–36)
aPTT: 93 seconds — ABNORMAL HIGH (ref 24–36)
aPTT: 82 seconds — ABNORMAL HIGH (ref 24–36)

## 2021-08-29 LAB — URINALYSIS, MICROSCOPIC (REFLEX): Bacteria, UA: NONE SEEN

## 2021-08-29 LAB — PROTIME-INR
INR: 1.4 — ABNORMAL HIGH (ref 0.8–1.2)
Prothrombin Time: 16.7 seconds — ABNORMAL HIGH (ref 11.4–15.2)

## 2021-08-29 LAB — TROPONIN I (HIGH SENSITIVITY): Troponin I (High Sensitivity): 1110 ng/L (ref <18)

## 2021-08-29 LAB — HEPARIN LEVEL (UNFRACTIONATED)
Heparin Unfractionated: >1.1 IU/mL — ABNORMAL HIGH (ref 0.30–0.70)
Heparin Unfractionated: >1.1 IU/mL — ABNORMAL HIGH (ref 0.30–0.70)

## 2021-08-29 MED ORDER — POTASSIUM CHLORIDE CRYS ER 20 MEQ PO TBCR: 20.0000 meq | EXTENDED_RELEASE_TABLET | Freq: Two times a day (BID) | ORAL | Status: DC

## 2021-08-29 MED ORDER — NITROGLYCERIN 0.4 MG SL SUBL: 0.4000 mg | SUBLINGUAL_TABLET | SUBLINGUAL | Status: DC | PRN

## 2021-08-29 MED ORDER — ATORVASTATIN CALCIUM 80 MG PO TABS
80.0000 mg | ORAL_TABLET | Freq: Every day | ORAL | Status: DC
  Administered 2021-08-31 – 2021-09-06 (×7): 80 mg via ORAL
  Filled 2021-08-29 (×7): qty 1
  Filled 2021-08-29: qty 4

## 2021-08-29 MED ORDER — SODIUM CHLORIDE 0.9 % IV SOLN
500.0000 mg | INTRAVENOUS | Status: DC
  Administered 2021-08-29 – 2021-09-01 (×4): 500 mg via INTRAVENOUS
  Filled 2021-08-29 (×5): qty 500

## 2021-08-29 MED ORDER — ACETAMINOPHEN 325 MG PO TABS
650.0000 mg | ORAL_TABLET | ORAL | Status: DC | PRN
  Administered 2021-08-29 – 2021-08-30 (×2): 650 mg via ORAL
  Filled 2021-08-29: qty 2

## 2021-08-29 MED ORDER — ONDANSETRON HCL 4 MG/2ML IJ SOLN: 4.0000 mg | Freq: Four times a day (QID) | INTRAMUSCULAR | Status: DC | PRN

## 2021-08-29 MED ORDER — ASPIRIN EC 81 MG PO TBEC: 81.0000 mg | DELAYED_RELEASE_TABLET | Freq: Every day | ORAL | Status: DC

## 2021-08-29 MED ORDER — FUROSEMIDE 10 MG/ML IJ SOLN
20.0000 mg | Freq: Two times a day (BID) | INTRAMUSCULAR | Status: DC
  Administered 2021-08-29 – 2021-09-03 (×10): 20 mg via INTRAVENOUS
  Filled 2021-08-29 (×8): qty 2
  Filled 2021-08-29: qty 4
  Filled 2021-08-29: qty 2

## 2021-08-29 MED ORDER — ASPIRIN 300 MG RE SUPP: 300.0000 mg | RECTAL | Status: AC

## 2021-08-29 MED ORDER — ALPRAZOLAM 0.25 MG PO TABS
0.2500 mg | ORAL_TABLET | Freq: Two times a day (BID) | ORAL | Status: DC | PRN
  Administered 2021-09-02: 0.25 mg via ORAL
  Filled 2021-08-29: qty 1

## 2021-08-29 MED ORDER — MAGNESIUM HYDROXIDE 400 MG/5ML PO SUSP: 30.0000 mL | Freq: Every day | ORAL | Status: DC | PRN

## 2021-08-29 MED ORDER — ASPIRIN 81 MG PO CHEW: 324.0000 mg | CHEWABLE_TABLET | ORAL | Status: AC

## 2021-08-29 MED ORDER — LORATADINE 10 MG PO TABS
10.0000 mg | ORAL_TABLET | Freq: Every day | ORAL | Status: DC
  Administered 2021-08-29 – 2021-09-06 (×8): 10 mg via ORAL
  Filled 2021-08-29 (×8): qty 1

## 2021-08-29 MED ORDER — DM-GUAIFENESIN ER 30-600 MG PO TB12
1.0000 | ORAL_TABLET | Freq: Two times a day (BID) | ORAL | Status: DC
  Administered 2021-08-29 – 2021-09-06 (×16): 1 via ORAL
  Filled 2021-08-29 (×21): qty 1

## 2021-08-29 MED ORDER — OYSTER SHELL CALCIUM/D3 500-5 MG-MCG PO TABS
2.0000 | ORAL_TABLET | Freq: Every day | ORAL | Status: DC
  Administered 2021-08-29 – 2021-09-06 (×8): 2 via ORAL
  Filled 2021-08-29 (×9): qty 2

## 2021-08-29 MED ORDER — HEPARIN (PORCINE) 25000 UT/250ML-% IV SOLN
800.0000 [IU]/h | INTRAVENOUS | Status: DC
  Administered 2021-08-29: 22:00:00 900 [IU]/h via INTRAVENOUS
  Filled 2021-08-29: qty 250

## 2021-08-29 MED ORDER — SODIUM CHLORIDE 0.9% FLUSH
3.0000 mL | Freq: Two times a day (BID) | INTRAVENOUS | Status: DC
  Administered 2021-08-29 – 2021-09-06 (×15): 3 mL via INTRAVENOUS

## 2021-08-29 MED ORDER — HEPARIN BOLUS VIA INFUSION
4000.0000 [IU] | INTRAVENOUS | Status: AC
  Administered 2021-08-29: 4000 [IU] via INTRAVENOUS
  Filled 2021-08-29: qty 4000

## 2021-08-29 MED ORDER — PANTOPRAZOLE SODIUM 40 MG PO TBEC
40.0000 mg | DELAYED_RELEASE_TABLET | Freq: Every day | ORAL | Status: DC
  Administered 2021-08-29 – 2021-09-06 (×8): 40 mg via ORAL
  Filled 2021-08-29 (×8): qty 1

## 2021-08-29 MED ORDER — GUAIFENESIN 100 MG/5ML PO LIQD
5.0000 mL | ORAL | Status: DC | PRN
  Administered 2021-08-30 – 2021-09-06 (×8): 5 mL via ORAL
  Filled 2021-08-29 (×12): qty 5

## 2021-08-29 MED ORDER — CARBOXYMETHYLCELLUL-GLYCERIN 0.5-0.9 % OP SOLN
1.0000 [drp] | Freq: Four times a day (QID) | OPHTHALMIC | Status: DC | PRN
  Filled 2021-08-29: qty 15

## 2021-08-29 MED ORDER — IPRATROPIUM-ALBUTEROL 0.5-2.5 (3) MG/3ML IN SOLN
3.0000 mL | Freq: Three times a day (TID) | RESPIRATORY_TRACT | Status: DC
  Administered 2021-08-29 – 2021-09-01 (×9): 3 mL via RESPIRATORY_TRACT
  Filled 2021-08-29 (×9): qty 3

## 2021-08-29 MED ORDER — FLUTICASONE PROPIONATE 50 MCG/ACT NA SUSP
1.0000 | Freq: Every day | NASAL | Status: DC | PRN
  Filled 2021-08-29: qty 16

## 2021-08-29 MED ORDER — POTASSIUM CHLORIDE CRYS ER 20 MEQ PO TBCR
20.0000 meq | EXTENDED_RELEASE_TABLET | Freq: Three times a day (TID) | ORAL | Status: DC
  Administered 2021-08-29 – 2021-08-31 (×8): 20 meq via ORAL
  Filled 2021-08-29 (×6): qty 1
  Filled 2021-08-29: qty 2
  Filled 2021-08-29: qty 1
  Filled 2021-08-29: qty 2

## 2021-08-29 MED ORDER — SODIUM CHLORIDE 0.9 % IV SOLN
1.0000 g | INTRAVENOUS | Status: AC
  Administered 2021-08-29 – 2021-09-01 (×4): 1 g via INTRAVENOUS
  Filled 2021-08-29: qty 10
  Filled 2021-08-29: qty 1
  Filled 2021-08-29 (×3): qty 10

## 2021-08-29 MED ORDER — ESCITALOPRAM OXALATE 10 MG PO TABS
5.0000 mg | ORAL_TABLET | Freq: Every day | ORAL | Status: DC
  Administered 2021-08-29 – 2021-09-06 (×8): 5 mg via ORAL
  Filled 2021-08-29: qty 1
  Filled 2021-08-29 (×8): qty 0.5

## 2021-08-29 MED ORDER — DILTIAZEM HCL ER COATED BEADS 120 MG PO CP24
120.0000 mg | ORAL_CAPSULE | Freq: Every day | ORAL | Status: DC
  Administered 2021-08-29 – 2021-08-30 (×2): 120 mg via ORAL
  Filled 2021-08-29 (×2): qty 1

## 2021-08-29 MED ORDER — METOPROLOL TARTRATE 50 MG PO TABS
50.0000 mg | ORAL_TABLET | Freq: Two times a day (BID) | ORAL | Status: DC
  Administered 2021-08-29 (×2): 50 mg via ORAL
  Filled 2021-08-29: qty 1
  Filled 2021-08-29: qty 2
  Filled 2021-08-29: qty 1

## 2021-08-29 NOTE — Progress Notes (Signed)
Brief cardiology consult note  Impression Non-STEMI Elevated troponins Hypokalemia Congestive heart failure Lower extremity edema Hyperlipidemia GERD Atrial fibrillation  . Plan Agree with admit follow-up troponins EKGs Hold Eliquis and switch to IV heparin ECHOCardiogram for assessment left ventricular function Continue IV diuretic therapy for heart failure Recommend beta-blocker instead of calcium blocker because of heart failure Correct electrolytes Support stockings elevation Continue statin therapy for hyperlipidemia Recommend cardiac cath in the morning

## 2021-08-29 NOTE — Assessment & Plan Note (Signed)
POA with troponin peaking at 1554, then trended down.  Pt had acute onset dyspnea without chest pain.  Unclear if ACS or demand ischemia with A-fib RVR.   Evaluation still underway. --Mgmt per cardiology --Continue heparin drip, hold Eliquis --Plan for cardiac cath tomorrow --Continue statin --IV Diuresis for likely associate CHF decompensation --Follow up pending Echo report --Monitor and replace electrolytes --EKG and repeat troponin if recurrent chest pain --Telemetry

## 2021-08-29 NOTE — Progress Notes (Signed)
[place] for heparin infusion  Indication: ACS/STEMI  Allergies  Allergen Reactions   Amoxicillin-Pot Clavulanate Nausea Only   Cefprozil Nausea Only   Clarithromycin Nausea Only   Prednisone     Other reaction(s): Other (See Comments) Hyperglycemia   Pseudoephedrine     Other reaction(s): Unknown   Penicillins Rash   Sulfa Antibiotics Rash    Patient Measurements: Height: 5' 5.5" (166.4 cm) Weight: 70.3 kg (155 lb) IBW/kg (Calculated) : 58.15 Heparin Dosing Weight: 70.3 kg  Vital Signs: Temp: 98.4 F (36.9 C) (11/30 2026) Temp Source: Oral (11/30 2026) BP: 135/84 (11/30 2345) Pulse Rate: 120 (11/30 2345)  Labs: Recent Labs    08/28/21 2022 08/28/21 2201  HGB 13.4  --   HCT 41.5  --   PLT 210  --   CREATININE 0.88  --   TROPONINIHS 1,478* 1,554*    Estimated Creatinine Clearance: 40.6 mL/min (by C-G formula based on SCr of 0.88 mg/dL).   Medical History: Past Medical History:  Diagnosis Date   Allergic rhinitis    Arthritis    Colon polyp    Edema    Esophageal spasm    GERD (gastroesophageal reflux disease)    Hypertension     Medications:  PTA:  Eliquis 2.5 mg BID  Assessment: Pt is 87 yo female presenting to ED w/ SOB, found with elevated Troponin I, trending up.  Goal of Therapy:  Heparin level 0.3-0.7 units/ml aPTT 66-102 seconds Monitor platelets by anticoagulation protocol: Yes   Plan:  Ordered baseline labs Bolus 4000 units x 1 Start heparin infusion at 900 units/hr Will follow aPTT until correlation w/ HL is confirmed Will recheck aPTT in 8 hr after start of infusion CBC and HL daily while on heparin.  Renda Rolls, PharmD, [facility] 08/29/2021 12:04 AM

## 2021-08-29 NOTE — Progress Notes (Signed)
Progress Note    [name]   NFA:[id number]  DOB: 1927-06-21  DOA: 08/28/2021     1 Date of Service: 08/29/2021   Brief Narrative [name] is a 87 y.o. Caucasian female with medical history significant for GERD, hypertension, osteoarthritis, non-STEMI, chronic diastolic CHF, and atrial fibrillation on Eliquis, coming with acute onset cough with associated dyspnea without chest pain or palpitations.  She denies any orthopnea or proximal nocturnal dyspnea.  She has chronic significant lower extremity edema.  No fever or chills.  She reported two weeks of diarrhea mostly after meals, but no nausea, vomiting or abdominal pain.  She had been prescribed Omnicef by PCP for presumed respiratory infection due to cough and feeling generally ill, had taken it for 3 days and initially improved prior to worsening again.  Remains with very coarse productive cough.  Admitted for further evaluation and management of NSTEMI, A-fib RVR, apparent decompensated CHF, possible PNA vs bronchitis.  Cardiology consulted.    Assessment and Plan * NSTEMI (non-ST elevated myocardial infarction) ([place]) POA with troponin peaking at 1554, then trended down.  Pt had acute onset dyspnea without chest pain.  Unclear if ACS or demand ischemia with A-fib RVR.   Evaluation still underway. --Mgmt per cardiology --Continue heparin drip, hold Eliquis --Plan for cardiac cath tomorrow --Continue statin --IV Diuresis for likely associate CHF decompensation --Follow up pending Echo report --Monitor and replace electrolytes --EKG and repeat troponin if recurrent chest pain --Telemetry  Hypokalemia Not POA, likely due to diuresis. K 3.1 on AM labs today 12/1.  Replacing. Monitor and replace to keep K>4. Also monitor and replace Mg.  Atrial fibrillation with RVR (HCC) POA with HR's up to 130's-140's. Likely driven by NSTEMI, CHF, ?infection. Given Cardizem IV bolus in ED. --Cardiology following, see their recs --Continue  Cardizem CD 120 mg PO daily --Continue Lopressor 50 mg PO BID --Telemetry --Maintain K>4, Mg>2   Acute respiratory failure with hypoxia (HCC) Present on admission - pt presented with dyspnea, tachypnea.  Requiring 2 L/min supplemental O2 to maintain spO2 > 90%. Due to A-fib RVR, NSTEMI and likely acute on chronic CHF, possible respiratory infection as well with very productive cough. CTA chest negative for PE, showed chronic changes and sequelae of prior infection in R upper/middle lobes with bronchiectasis. --Supplement O2 to keep sats > 90% --Wean O2 as tolerated --Mgmt of underlying conditions as below  Community acquired pneumonia POA - pt has very productive cough with thick yellow sputum. Had taken 3 days of Omnicef prescribed by PCP with some improvement intially. --empiric Rocephin, Zithromax --sputum cx --mucolytics --supplement O2 as above PRN  Diarrhea POA - pt reported diarrhea following meals for about two weeks, onset before she was started on antibiotics outpatient. --check C diff and GI panel --enteric precautions for now --monitor electrolytes and hydration status  GERD (gastroesophageal reflux disease) Chronic, stable.  Continue PPI.     Subjective:  Pt seen in the ED, holding for a bed.  Continues to have very deep, productive coarse cough with yellow phlegm.  No chest pain or SOB at this time.  Diarrhea x 2 weeks after meals.  PCP started antibiotic she took 3 days, initially felt better before yesterday.    Objective Vitals:   08/29/21 1345 08/29/21 1445 08/29/21 1500 08/29/21 1621  BP: 102/75 100/72 (!) 98/50 117/71  Pulse: 91 89 82 94  Resp: 18  20 16   Temp:   98.3 F (36.8 C) 97.9 F (36.6 C)  TempSrc:  Oral Oral  SpO2: 100% 100% 100% 100%  Weight:      Height:       70.3 kg  Vital signs were reviewed and unremarkable except for: Tachypnea, borderline low blood pressure   Exam General exam: awake, alert, no acute distress HEENT:  atraumatic, clear conjunctiva, anicteric sclera, moist mucus membranes, hearing grossly normal  Respiratory system: Coarse rhonchi versus referred upper airway secretion sounds throughout, no expiratory wheezes, frequent coarse sounding cough, normal respiratory effort at rest, on 2 L/min nasal cannula oxygen. Cardiovascular system: normal S1/S2, irregularly irregular, 3+ pitting edema of bilateral lower extremities.   Gastrointestinal system: soft, NT, ND, no HSM felt, +bowel sounds. Central nervous system: A&O x4. no gross focal neurologic deficits, normal speech Extremities: Moves all, normal tone, significant pitting edema bilateral lower extremities, mild erythema of bilateral lower extremities without differential warmth Psychiatry: normal mood, congruent affect, judgement and insight appear normal   Labs / Other Information My review of labs, imaging, notes and other tests is significant for Mild hyponatremia resolved, new hypokalemia with K3.1, troponin down trended to 1110, HDL mildly low at 36 otherwise normal lipid panel     Disposition Plan: Status is: Inpatient  Remains inpatient appropriate because: Severity of illness on IV medications as outlined above with ongoing cardiac evaluation not appropriate for outpatient setting        Time spent: 35 minutes [facility] 08/29/2021, 6:10 PM

## 2021-08-29 NOTE — Consult Note (Signed)
CARDIOLOGY CONSULT NOTE               Patient ID: [name] MRN: [id number] DOB/AGE: 03/13/1927 87 y.o.  Admit date: 08/28/2021 Referring Physician Dr. Nicole Kindred hospitalist Primary Physician Dr. Emily Filbert primary Primary Cardiologist [facility] cardiology Reason for Consultation non-STEMI  HPI: Patient is a 87 year old female with diastolic heart failure presented with elevated troponins non-STEMI atrial fibrillation on Eliquis acute cough congestion sick diaphoresis.  Patient states she felt bad for the last day or 2 she finally came into the emergency room for evaluation she was evaluated for possible viral infection which was negative EKG had atrial fibrillation which has been chronic but her rate was increasing she was placed on Cardizem troponins subsequently came back about 1500 she never distinctly describe chest pain she just felt she said that she felt sick weak fatigue.  Patient has not been admitted for non-STEMI unstable angina  Review of systems complete and found to be negative unless listed above     Past Medical History:  Diagnosis Date   Allergic rhinitis    Arthritis    Colon polyp    Edema    Esophageal spasm    GERD (gastroesophageal reflux disease)    Hypertension     Past Surgical History:  Procedure Laterality Date   APPENDECTOMY     CATARACT EXTRACTION     dilatation and curatage     EYE SURGERY     HYSTEROTOMY     OOPHORECTOMY     TONSILLECTOMY      (Not in a hospital admission)  Social History   Socioeconomic History   Marital status: Widowed    Spouse name: Not on file   Number of children: Not on file   Years of education: Not on file   Highest education level: Not on file  Occupational History   Not on file  Tobacco Use   Smoking status: Never   Smokeless tobacco: Never  Substance and Sexual Activity   Alcohol use: Never   Drug use: Never   Sexual activity: Not on file  Other Topics Concern   Not on file  Social  History Narrative   Not on file   Social Determinants of Health   Financial Resource Strain: Not on file  Food Insecurity: Not on file  Transportation Needs: Not on file  Physical Activity: Not on file  Stress: Not on file  Social Connections: Not on file  Intimate Partner Violence: Not on file    Family History  Problem Relation Age of Onset   Dementia Mother    Heart attack Father       Review of systems complete and found to be negative unless listed above      PHYSICAL EXAM  General: Well developed, well nourished, in no acute distress HEENT:  Normocephalic and atramatic Neck:  No JVD.  Lungs: Clear bilaterally to auscultation and percussion. Heart: HRRR . Normal S1 and S2 without gallops or murmurs.  Abdomen: Bowel sounds are positive, abdomen soft and non-tender  Msk:  Back normal, normal gait. Normal strength and tone for age. Extremities: No clubbing, cyanosis or edema.   Neuro: Alert and oriented X 3. Psych:  Good affect, responds appropriately  Labs:   Lab Results  Component Value Date   WBC 6.6 08/29/2021   HGB 12.4 08/29/2021   HCT 38.2 08/29/2021   MCV 94.1 08/29/2021   PLT 185 08/29/2021    Recent Labs  Lab 08/28/21 2022 08/29/21  0606  NA 132* 135  K 3.8 3.1*  CL 101 106  CO2 24 22  BUN 31* 23  CREATININE 0.88 0.79  CALCIUM 9.1 8.3*  PROT 6.7  --   BILITOT 0.7  --   ALKPHOS 60  --   ALT 27  --   AST 40  --   GLUCOSE 115* 89   Lab Results  Component Value Date   CKTOTAL 55 02/20/2013   CKMB 1.3 02/20/2013   TROPONINI <0.03 06/30/2018    Lab Results  Component Value Date   CHOL 124 08/29/2021   Lab Results  Component Value Date   HDL 36 (L) 08/29/2021   Lab Results  Component Value Date   LDLCALC 67 08/29/2021   Lab Results  Component Value Date   TRIG 107 08/29/2021   Lab Results  Component Value Date   CHOLHDL 3.4 08/29/2021   No results found for: LDLDIRECT    Radiology: DG Chest 2 View  Result Date:  08/28/2021 CLINICAL DATA:  Shortness of breath. EXAM: CHEST - 2 VIEW COMPARISON:  August 27, 2020. FINDINGS: Stable cardiomegaly. No pneumothorax is noted. Mild bibasilar subsegmental atelectasis is noted. Bony thorax is unremarkable. IMPRESSION: Mild bibasilar subsegmental atelectasis. Electronically Signed   By: Marijo Conception M.D.   On: 08/28/2021 16:06   CT Angio Chest PE W and/or Wo Contrast  Result Date: 08/28/2021 CLINICAL DATA:  Shortness of breath EXAM: CT ANGIOGRAPHY CHEST WITH CONTRAST TECHNIQUE: Multidetector CT imaging of the chest was performed using the standard protocol during bolus administration of intravenous contrast. Multiplanar CT image reconstructions and MIPs were obtained to evaluate the vascular anatomy. CONTRAST:  26mL OMNIPAQUE IOHEXOL 350 MG/ML SOLN COMPARISON:  Chest x-ray 08/28/2021, CT chest 08/27/2020, CT chest 03/05/2009 FINDINGS: Cardiovascular: Satisfactory opacification of the pulmonary arteries to the segmental level. No evidence of pulmonary embolism. Mild aortic atherosclerosis. No aneurysm. Coronary vascular calcification. Borderline cardiomegaly. No pericardial effusion. Mediastinum/Nodes: Midline trachea. Mild mediastinal adenopathy, right paratracheal lymph node measures 15 mm. Subcarinal node measures 17 mm. Small right hilar nodes. Esophagus within normal limits. Lungs/Pleura: Apical scarring with mild bronchiectasis in the right upper and middle lobes. Chronic scarring in the right upper lobe. No definite acute superimposed airspace disease. No pleural effusion or pneumothorax. Upper Abdomen: No acute abnormality. Musculoskeletal: No chest wall abnormality. No acute or significant osseous findings. Review of the MIP images confirms the above findings. IMPRESSION: 1. Negative for acute pulmonary embolus. 2. Chronic lung disease and sequelae of prior infection most evident in the right upper and middle lobes. No definite acute superimposed airspace disease. 3.  Mild mediastinal adenopathy nonspecific, possibly reactive. Aortic Atherosclerosis (ICD10-I70.0). Electronically Signed   By: Donavan Foil M.D.   On: 08/28/2021 23:16    EKG: Atrial fibrillation rate controlled around 95 nonspecific ST-T wave changes Q waves anteriorly  ASSESSMENT AND PLAN:  Non-STEMI Heart failure Atrial fibrillation GERD Depression . Plan Admit to telemetry follow-up EKGs troponins Recommend anticoagulation with IV heparin Continue beta-blockade therapy in the face of ACS and heart failure Low-dose diuretic therapy for heart failure management Echocardiogram to assess for left-ventricular function wall motion Rate control of atrial fibrillation hopefully with just a beta-blocker patient has been on Cardizem as well we will hold Eliquis for now Consider cardiac cath for assessment of coronary anatomy in the face of a non-STEMI  Signed: Yolonda Kida MD 08/29/2021, 8:02 AM

## 2021-08-29 NOTE — Progress Notes (Signed)
[place] for heparin infusion  Indication: ACS/STEMI  Allergies  Allergen Reactions   Amoxicillin-Pot Clavulanate Nausea Only   Cefprozil Nausea Only   Clarithromycin Nausea Only   Etodolac Diarrhea   Prednisone     Other reaction(s): Other (See Comments) Hyperglycemia   Pseudoephedrine     Other reaction(s): Unknown   Penicillins Rash   Sulfa Antibiotics Rash    Patient Measurements: Height: 5' 5.5" (166.4 cm) Weight: 70.3 kg (155 lb) IBW/kg (Calculated) : 58.15 Heparin Dosing Weight: 70.3 kg  Vital Signs: BP: 124/80 (12/01 1145) Pulse Rate: 140 (12/01 1145)  Labs: Recent Labs    08/28/21 2022 08/28/21 2201 08/29/21 0606 08/29/21 0900  HGB 13.4  --  12.4  --   HCT 41.5  --  38.2  --   PLT 210  --  185  --   APTT  --  33  --  93*  LABPROT  --  16.7*  --   --   INR  --  1.4*  --   --   HEPARINUNFRC  --  >1.10*  --   --   CREATININE 0.88  --  0.79  --   TROPONINIHS 1,478* 1,554* 1,110*  --      Estimated Creatinine Clearance: 44.6 mL/min (by C-G formula based on SCr of 0.79 mg/dL).   Medical History: Past Medical History:  Diagnosis Date   Allergic rhinitis    Arthritis    Colon polyp    Edema    Esophageal spasm    GERD (gastroesophageal reflux disease)    Hypertension     Medications:  PTA:  Eliquis 2.5 mg BID  Assessment: Pt is 87 yo female presenting to ED w/ SOB, found with elevates troponin. Pharmacy consulted to manage heparin infusion for ACS (NSTEMI).  Baseline labs: Hgb 13.4 Plt 210 Scr 0.88 aPTT 33 HL >1.10 INR 1.4  Goal of Therapy:  Heparin level 0.3-0.7 units/ml aPTT 66-102 seconds Monitor platelets by anticoagulation protocol: Yes   Date/time Level  Comment 12/1@1245  aPTT 93 Therapeutic x 1  Plan:  aPTT therapeutic x 1, last dose of Eliquis 11/30@9am  Continue infusion at 900 units/hr Will recheck aPTT and HL in 8 hr CBC and HL daily while on heparin.  Kiylah Loyer Rodriguez-Guzman  PharmD, BCPS 08/29/2021 12:05 PM

## 2021-08-29 NOTE — Assessment & Plan Note (Signed)
POA - pt reported diarrhea following meals for about two weeks, onset before she was started on antibiotics outpatient. --check C diff and GI panel --enteric precautions for now --monitor electrolytes and hydration status

## 2021-08-29 NOTE — Assessment & Plan Note (Signed)
Present on admission - pt presented with dyspnea, tachypnea.  Requiring 2 L/min supplemental O2 to maintain spO2 > 90%. Due to A-fib RVR, NSTEMI and likely acute on chronic CHF, possible respiratory infection as well with very productive cough. CTA chest negative for PE, showed chronic changes and sequelae of prior infection in R upper/middle lobes with bronchiectasis. --Supplement O2 to keep sats > 90% --Wean O2 as tolerated --Mgmt of underlying conditions as below

## 2021-08-29 NOTE — Progress Notes (Signed)
*  PRELIMINARY RESULTS* Echocardiogram 2D Echocardiogram has been performed.  Sherrie Sport 08/29/2021, 12:49 PM

## 2021-08-29 NOTE — Progress Notes (Signed)
ANTICOAGULATION CONSULT NOTE   Pharmacy Consult for heparin infusion  Indication: ACS/STEMI  Patient Measurements: Height: 5' 5.5" (166.4 cm) Weight: 70.3 kg (155 lb) IBW/kg (Calculated) : 58.15 Heparin Dosing Weight: 70.3 kg  Vital Signs: Temp: 97.9 F (36.6 C) (12/01 1621) Temp Source: Oral (12/01 1621) BP: 117/71 (12/01 1621) Pulse Rate: 94 (12/01 1621)  Labs: Recent Labs    08/28/21 2022 08/28/21 2201 08/29/21 0606 08/29/21 0900  HGB 13.4  --  12.4  --   HCT 41.5  --  38.2  --   PLT 210  --  185  --   APTT  --  33  --  93*  LABPROT  --  16.7*  --   --   INR  --  1.4*  --   --   HEPARINUNFRC  --  >1.10*  --   --   CREATININE 0.88  --  0.79  --   TROPONINIHS 1,478* 1,554* 1,110*  --      Estimated Creatinine Clearance: 44.6 mL/min (by C-G formula based on SCr of 0.79 mg/dL).   Medical History: Past Medical History:  Diagnosis Date   Allergic rhinitis    Arthritis    Colon polyp    Edema    Esophageal spasm    GERD (gastroesophageal reflux disease)    Hypertension     Medications:  PTA:  Eliquis 2.5 mg BID  Assessment: Pt is 87 yo female presenting to ED w/ SOB, found with elevated. troponin. Pharmacy consulted to manage heparin infusion for ACS (NSTEMI). Cardiology is following and planning a cath in the morning.  Goal of Therapy:  Heparin level 0.3-0.7 units/ml aPTT 66-102 seconds Monitor platelets by anticoagulation protocol: Yes   Plan:  aPTT therapeutic x 2 Anti-Xa level not yet correlating to aPTT level, therefore we will continue to use aPTT to guide therapy Continue infusion at 900 units/hr Will recheck aPTT am CBC and HL daily while on heparin.  Vallery Sa, PharmD, BCPS 08/29/2021 5:53 PM

## 2021-08-29 NOTE — Assessment & Plan Note (Signed)
POA with HR's up to 130's-140's. Likely driven by NSTEMI, CHF, ?infection. Given Cardizem IV bolus in ED. --Cardiology following, see their recs --Continue Cardizem CD 120 mg PO daily --Continue Lopressor 50 mg PO BID --Telemetry --Maintain K>4, Mg>2

## 2021-08-29 NOTE — ED Notes (Signed)
Informed RN bed assigned 

## 2021-08-29 NOTE — ED Notes (Signed)
Blue top sent to lab. 

## 2021-08-29 NOTE — Progress Notes (Incomplete)
°  Progress Note    [name]   LYY:[id number]  DOB: 1926/10/13  DOA: 08/28/2021     1 Date of Service: 08/29/2021   Clinical Course {CHL Clinical Course:26391}  Assessment and Plan No new Assessment & Plan notes have been filed under this hospital service since the last note was generated. Service: Hospitalist    Subjective:  ***  Objective Vitals:   08/29/21 0545 08/29/21 0600 08/29/21 0630 08/29/21 0700  BP: 110/74 115/67 106/68 111/60  Pulse: 92 98 99 89  Resp: (!) 24 (!) 24 (!) 23 (!) 23  Temp:      TempSrc:      SpO2: 99% 98% 98% 99%  Weight:      Height:       70.3 kg  {Vitals:26382}   Exam Physical Exam ***  Labs / Other Information {Results:26384}   Disposition Plan: Status is: Inpatient  {Inpatient:23812}       Time spent: *** minutes [facility] 08/29/2021, 8:05 AM

## 2021-08-29 NOTE — Hospital Course (Signed)
Adena Sima is a 87 y.o. Caucasian female with medical history significant for GERD, hypertension, osteoarthritis, non-STEMI, chronic diastolic CHF, and atrial fibrillation on Eliquis, coming with acute onset cough with associated dyspnea without chest pain or palpitations.  She denies any orthopnea or proximal nocturnal dyspnea.  She has chronic significant lower extremity edema.  No fever or chills.  She reported two weeks of diarrhea mostly after meals, but no nausea, vomiting or abdominal pain.  She had been prescribed Omnicef by PCP for presumed respiratory infection due to cough and feeling generally ill, had taken it for 3 days and initially improved prior to worsening again.  Remains with very coarse productive cough.  Admitted for further evaluation and management of NSTEMI, A-fib RVR, apparent decompensated CHF, possible PNA vs bronchitis.  Cardiology consulted.

## 2021-08-29 NOTE — Assessment & Plan Note (Signed)
POA - pt has very productive cough with thick yellow sputum. Had taken 3 days of Omnicef prescribed by PCP with some improvement intially. --empiric Rocephin, Zithromax --sputum cx --mucolytics --supplement O2 as above PRN

## 2021-08-29 NOTE — H&P (Signed)
[place]   PATIENT NAME: [name]    MR#:  [id number]  DATE OF BIRTH:  11/19/1926  DATE OF ADMISSION:  08/28/2021  PRIMARY CARE PHYSICIAN: Rusty Aus, MD   Patient is coming from: Home  REQUESTING/REFERRING PHYSICIAN: Lurline Hare, MD  CHIEF COMPLAINT:   Chief Complaint  Patient presents with   Shortness of Breath    HISTORY OF PRESENT ILLNESS:  [name] is a 87 y.o. Caucasian female with medical history significant for GERD, hypertension, osteoarthritis, non-STEMI, chronic diastolic CHF, and atrial fibrillation on Eliquis, coming with acute onset cough with associated dyspnea without chest pain or palpitations.  She denies any orthopnea or proximal nocturnal dyspnea.  She has chronic significant lower extremity edema.  No fever or chills.  No nausea or vomiting or abdominal pain.  No dysuria, oliguria or hematuria or flank pain.  ED Course: Upon position to the ER blood pressure was 141/86 and otherwise within normal.  CMP revealed mild hyponatremia and a BUN of 31.  High-sensitivity troponin was 1478 and later 1534.  Lactic acid 1.5 and procalcitonin 0.14.  BNP was 1095 CBC was within normal.  Influenza antigens and COVID-19 PCR came back negative.  UA showed 30 protein and trace ketones. EKG as reviewed by me : EKG showed atrial fibrillation with controlled tach responses of 95 with Q waves anteroseptally Imaging: 2 view chest x-ray showed mild bibasilar subsegmental atelectasis.  Chest CTA revealed no evidence for PE.  It showed chronic lung disease and mild mediastinal adenopathy possibly reactive as well as aortic atherosclerosis with no acute findings.  The patient was given 40 of aspirin, 10 g of IV Cardizem, IV heparin bolus and drip and 20 mg of IV Lasix.  She will be admitted to a progressive unit bed for further evaluation and management. PAST MEDICAL HISTORY:   Past Medical History:  Diagnosis Date   Allergic rhinitis    Arthritis    Colon polyp     Edema    Esophageal spasm    GERD (gastroesophageal reflux disease)    Hypertension   -Atrial fibrillation on Eliquis - Chronic diastolic CHF  PAST SURGICAL HISTORY:   Past Surgical History:  Procedure Laterality Date   APPENDECTOMY     CATARACT EXTRACTION     dilatation and curatage     EYE SURGERY     HYSTEROTOMY     OOPHORECTOMY     TONSILLECTOMY      SOCIAL HISTORY:   Social History   Tobacco Use   Smoking status: Never   Smokeless tobacco: Never  Substance Use Topics   Alcohol use: Never    FAMILY HISTORY:   Family History  Problem Relation Age of Onset   Dementia Mother    Heart attack Father     DRUG ALLERGIES:   Allergies  Allergen Reactions   Amoxicillin-Pot Clavulanate Nausea Only   Cefprozil Nausea Only   Clarithromycin Nausea Only   Prednisone     Other reaction(s): Other (See Comments) Hyperglycemia   Pseudoephedrine     Other reaction(s): Unknown   Penicillins Rash   Sulfa Antibiotics Rash    REVIEW OF SYSTEMS:   ROS As per history of present illness. All pertinent systems were reviewed above. Constitutional, HEENT, cardiovascular, respiratory, GI, GU, musculoskeletal, neuro, psychiatric, endocrine, integumentary and hematologic systems were reviewed and are otherwise negative/unremarkable except for positive findings mentioned above in the HPI.   MEDICATIONS AT HOME:   Prior to Admission  medications   Medication Sig Start Date End Date Taking? Authorizing Provider  apixaban (ELIQUIS) 5 MG TABS tablet Take 1 tablet (5 mg total) by mouth 2 (two) times daily. 09/03/20   Lorella Nimrod, MD  Calcium Carbonate-Vitamin D (CALCIUM HIGH POTENCY/VITAMIN D) 600-200 MG-UNIT TABS Take 2 tablets by mouth daily.     [provider]  carboxymethylcellul-glycerin (OPTIVE) 0.5-0.9 % ophthalmic solution Place 1 drop into both eyes 4 (four) times daily as needed for dry eyes.    [provider]  cetirizine (ZYRTEC) 10 MG tablet Take 10 mg  by mouth at bedtime.    [provider]  dextromethorphan-guaiFENesin (MUCINEX DM) 30-600 MG 12hr tablet Take 1 tablet by mouth 2 (two) times daily. 09/03/20   Lorella Nimrod, MD  diltiazem (CARTIA XT) 240 MG 24 hr capsule Take 240 mg by mouth daily.  02/02/18   [provider]  escitalopram (LEXAPRO) 5 MG tablet Take 5 mg by mouth in the morning and at bedtime.  02/02/18   [provider]  fluticasone (FLONASE) 50 MCG/ACT nasal spray Place 1 spray into the nose daily as needed.  09/09/16   [provider]  furosemide (LASIX) 20 MG tablet Take 20 mg by mouth daily.  08/20/20   [provider]  ipratropium-albuterol (DUONEB) 0.5-2.5 (3) MG/3ML SOLN Inhale 3 mLs into the lungs 3 (three) times daily. 07/06/20   [provider]  metoprolol tartrate (LOPRESSOR) 25 MG tablet Take 1 tablet (25 mg total) by mouth 2 (two) times daily. 09/03/20   Lorella Nimrod, MD  omeprazole (PRILOSEC) 20 MG capsule Take 20 mg by mouth daily.    [provider]  potassium chloride SA (KLOR-CON) 20 MEQ tablet Take 20 mEq by mouth 3 (three) times daily. 06/25/20   [provider]      VITAL SIGNS:  Blood pressure (!) 131/95, pulse (!) 101, temperature 98.4 F (36.9 C), temperature source Oral, resp. rate (!) 25, height 5' 5.5" (1.664 m), weight 70.3 kg, SpO2 100 %.  PHYSICAL EXAMINATION:  Physical Exam  GENERAL:  87 y.o.-year-old Caucasian female patient lying in the bed with no acute distress.  EYES: Pupils equal, round, reactive to light and accommodation. No scleral icterus. Extraocular muscles intact.  HEENT: Head atraumatic, normocephalic. Oropharynx and nasopharynx clear.  NECK:  Supple, no jugular venous distention. No thyroid enlargement, no tenderness.  LUNGS: Slightly diminished bibasilar breath sounds. No use of accessory muscles of respiration.  CARDIOVASCULAR: Regular rate and rhythm, S1, S2 normal. No murmurs, rubs, or gallops.  ABDOMEN:  Soft, nondistended, nontender. Bowel sounds present. No organomegaly or mass.  EXTREMITIES: 3+ bilateral lower extremity soft pitting edema with no cyanosis, or clubbing.  NEUROLOGIC: Cranial nerves II through XII are intact. Muscle strength 5/5 in all extremities. Sensation intact. Gait not checked.  PSYCHIATRIC: The patient is alert and oriented x 3.  Normal affect and good eye contact. SKIN: No obvious rash, lesion, or ulcer.   LABORATORY PANEL:   CBC Recent Labs  Lab 08/28/21 2022  WBC 8.0  HGB 13.4  HCT 41.5  PLT 210   ------------------------------------------------------------------------------------------------------------------  Chemistries  Recent Labs  Lab 08/28/21 2022  NA 132*  K 3.8  CL 101  CO2 24  GLUCOSE 115*  BUN 31*  CREATININE 0.88  CALCIUM 9.1  AST 40  ALT 27  ALKPHOS 60  BILITOT 0.7   ------------------------------------------------------------------------------------------------------------------  Cardiac Enzymes No results for input(s): TROPONINI in the last 168 hours. ------------------------------------------------------------------------------------------------------------------  RADIOLOGY:  DG  Chest 2 View  Result Date: 08/28/2021 CLINICAL DATA:  Shortness of breath. EXAM: CHEST - 2 VIEW COMPARISON:  August 27, 2020. FINDINGS: Stable cardiomegaly. No pneumothorax is noted. Mild bibasilar subsegmental atelectasis is noted. Bony thorax is unremarkable. IMPRESSION: Mild bibasilar subsegmental atelectasis. Electronically Signed   By: Marijo Conception M.D.   On: 08/28/2021 16:06   CT Angio Chest PE W and/or Wo Contrast  Result Date: 08/28/2021 CLINICAL DATA:  Shortness of breath EXAM: CT ANGIOGRAPHY CHEST WITH CONTRAST TECHNIQUE: Multidetector CT imaging of the chest was performed using the standard protocol during bolus administration of intravenous contrast. Multiplanar CT image reconstructions and MIPs were obtained to evaluate the  vascular anatomy. CONTRAST:  64mL OMNIPAQUE IOHEXOL 350 MG/ML SOLN COMPARISON:  Chest x-ray 08/28/2021, CT chest 08/27/2020, CT chest 03/05/2009 FINDINGS: Cardiovascular: Satisfactory opacification of the pulmonary arteries to the segmental level. No evidence of pulmonary embolism. Mild aortic atherosclerosis. No aneurysm. Coronary vascular calcification. Borderline cardiomegaly. No pericardial effusion. Mediastinum/Nodes: Midline trachea. Mild mediastinal adenopathy, right paratracheal lymph node measures 15 mm. Subcarinal node measures 17 mm. Small right hilar nodes. Esophagus within normal limits. Lungs/Pleura: Apical scarring with mild bronchiectasis in the right upper and middle lobes. Chronic scarring in the right upper lobe. No definite acute superimposed airspace disease. No pleural effusion or pneumothorax. Upper Abdomen: No acute abnormality. Musculoskeletal: No chest wall abnormality. No acute or significant osseous findings. Review of the MIP images confirms the above findings. IMPRESSION: 1. Negative for acute pulmonary embolus. 2. Chronic lung disease and sequelae of prior infection most evident in the right upper and middle lobes. No definite acute superimposed airspace disease. 3. Mild mediastinal adenopathy nonspecific, possibly reactive. Aortic Atherosclerosis (ICD10-I70.0). Electronically Signed   By: Donavan Foil M.D.   On: 08/28/2021 23:16      IMPRESSION AND PLAN:  Principal Problem:   NSTEMI (non-ST elevated myocardial infarction) ([place])  1.  Non-ST elevation myocardial infarction.  She may be having associated acute on chronic diastolic CHF. - The patient will be admitted to a PCU bed. - We will continue her on IV heparin. - She will be on high-dose statin therapy and beta-blocker therapy with Lopressor. - We will check fasting lipids in AM. - Will be on aspirin and as needed sublingual nitroglycerin and morphine sulfate for pain. - Will be diuresed with IV Lasix. - Cardiology  consult and 2D echo be obtained in a.m. - I notified Dr. Corky Sox about the patient.  2.  Atrial fibrillation with rapid ventricular response. - The patient's rate has been fairly controlled after IV Cardizem bolus. - We will continue Eliquis as well as p.o. Cardizem CD and Lopressor.  3.  Depression. - We will continue Lexapro.  4.  GERD. - We will continue PPI therapy.  DVT prophylaxis: IV heparin Code Status: The is DNR/DNI. Family Communication:  The plan of care was discussed in details with the patient (and family). I answered all questions. The patient agreed to proceed with the above mentioned plan. Further management will depend upon hospital course. Disposition Plan: Back to previous home environment Consults called: Neurology. All the records are reviewed and case discussed with ED provider.  Status is: Inpatient   Remains inpatient appropriate because:Ongoing diagnostic testing needed not appropriate for outpatient work up, Unsafe d/c plan, IV treatments appropriate due to intensity of illness or inability to take PO, and Inpatient level of care appropriate due to severity of illness   Dispo: The patient is from: Home  Anticipated d/c is to: Home              Patient currently is not medically stable to d/c.              Difficult to place patient: No  TOTAL TIME TAKING CARE OF THIS PATIENT: 55 minutes.     Christel Mormon M.D on 08/29/2021 at 4:19 AM  [facility]   From 7 PM-7 AM, contact night-coverage [email or web address]  CC: Primary care physician; Rusty Aus, MD

## 2021-08-29 NOTE — Assessment & Plan Note (Signed)
Chronic, stable.  Continue PPI.

## 2021-08-29 NOTE — Assessment & Plan Note (Signed)
Not POA, likely due to diuresis. K 3.1 on AM labs today 12/1.  Replacing. Monitor and replace to keep K>4. Also monitor and replace Mg.

## 2021-08-30 ENCOUNTER — Encounter: Payer: Self-pay | Admitting: Internal Medicine

## 2021-08-30 ENCOUNTER — Other Ambulatory Visit: Payer: Self-pay

## 2021-08-30 ENCOUNTER — Encounter
Admission: EM | Disposition: A | Discharge status: Skilled Nursing Facility | Payer: Self-pay | Source: Home / Self Care | Attending: Internal Medicine

## 2021-08-30 DIAGNOSIS — I214 Non-ST elevation (NSTEMI) myocardial infarction: Secondary | ICD-10-CM | POA: Diagnosis not present

## 2021-08-30 DIAGNOSIS — I5021 Acute systolic (congestive) heart failure: Secondary | ICD-10-CM | POA: Diagnosis present

## 2021-08-30 HISTORY — PX: LEFT HEART CATH AND CORONARY ANGIOGRAPHY: CATH118249

## 2021-08-30 HISTORY — PX: CORONARY STENT INTERVENTION: CATH118234

## 2021-08-30 LAB — GASTROINTESTINAL PANEL BY PCR, STOOL (REPLACES STOOL CULTURE)

## 2021-08-30 LAB — COMPREHENSIVE METABOLIC PANEL
ALT: 20 U/L (ref 0–44)
AST: 26 U/L (ref 15–41)
Albumin: 2.7 g/dL — ABNORMAL LOW (ref 3.5–5.0)
Alkaline Phosphatase: 58 U/L (ref 38–126)
Anion gap: 7 (ref 5–15)
BUN: 22 mg/dL (ref 8–23)
CO2: 26 mmol/L (ref 22–32)
Calcium: 8.7 mg/dL — ABNORMAL LOW (ref 8.9–10.3)
Chloride: 105 mmol/L (ref 98–111)
Creatinine, Ser: 1 mg/dL (ref 0.44–1.00)
GFR, Estimated: 53 mL/min — ABNORMAL LOW (ref >60)
Glucose, Bld: 90 mg/dL (ref 70–99)
Potassium: 3.9 mmol/L (ref 3.5–5.1)
Sodium: 138 mmol/L (ref 135–145)
Total Bilirubin: 0.5 mg/dL (ref 0.3–1.2)
Total Protein: 5.7 g/dL — ABNORMAL LOW (ref 6.5–8.1)

## 2021-08-30 LAB — CBC
HCT: 39.5 % (ref 36.0–46.0)
Hemoglobin: 12.6 g/dL (ref 12.0–15.0)
MCH: 30.4 pg (ref 26.0–34.0)
MCHC: 31.9 g/dL (ref 30.0–36.0)
MCV: 95.2 fL (ref 80.0–100.0)
Platelets: 199 10*3/uL (ref 150–400)
RBC: 4.15 MIL/uL (ref 3.87–5.11)
RDW: 14.2 % (ref 11.5–15.5)
WBC: 6.2 10*3/uL (ref 4.0–10.5)
nRBC: 0 % (ref 0.0–0.2)

## 2021-08-30 LAB — MAGNESIUM: Magnesium: 1.7 mg/dL (ref 1.7–2.4)

## 2021-08-30 LAB — ECHOCARDIOGRAM COMPLETE
AR max vel: 2.75 cm2
AV Area VTI: 2.92 cm2
AV Area mean vel: 2.64 cm2
AV Mean grad: 3 mmHg
AV Peak grad: 4.2 mmHg
Ao pk vel: 1.03 m/s
Area-P 1/2: 4.74 cm2
Height: 65.5 in
MV VTI: 2.48 cm2
S' Lateral: 3.1 cm
Weight: 2480 oz

## 2021-08-30 LAB — POCT ACTIVATED CLOTTING TIME: Activated Clotting Time: 558 seconds

## 2021-08-30 LAB — C DIFFICILE QUICK SCREEN W PCR REFLEX
C Diff antigen: NEGATIVE
C Diff interpretation: NOT DETECTED
C Diff toxin: NEGATIVE

## 2021-08-30 LAB — APTT: aPTT: 114 seconds — ABNORMAL HIGH (ref 24–36)

## 2021-08-30 LAB — TROPONIN I (HIGH SENSITIVITY): Troponin I (High Sensitivity): 832 ng/L (ref <18)

## 2021-08-30 SURGERY — LEFT HEART CATH AND CORONARY ANGIOGRAPHY
Anesthesia: Moderate Sedation

## 2021-08-30 MED ORDER — METOPROLOL TARTRATE 50 MG PO TABS
50.0000 mg | ORAL_TABLET | Freq: Once | ORAL | Status: AC
  Administered 2021-08-30: 50 mg via ORAL
  Filled 2021-08-30: qty 1

## 2021-08-30 MED ORDER — SODIUM CHLORIDE 0.9 % IV SOLN
INTRAVENOUS | Status: DC | PRN
  Administered 2021-08-30: 1.75 mg/kg/h via INTRAVENOUS

## 2021-08-30 MED ORDER — SODIUM CHLORIDE 0.9 % IV SOLN: INTRAVENOUS | Status: DC

## 2021-08-30 MED ORDER — LABETALOL HCL 5 MG/ML IV SOLN: 10.0000 mg | INTRAVENOUS | Status: AC | PRN

## 2021-08-30 MED ORDER — SODIUM CHLORIDE 0.9 % IV SOLN
0.2500 mg/kg/h | INTRAVENOUS | Status: AC
  Filled 2021-08-30: qty 250

## 2021-08-30 MED ORDER — IOHEXOL 350 MG/ML SOLN
INTRAVENOUS | Status: DC | PRN
  Administered 2021-08-30: 154 mL

## 2021-08-30 MED ORDER — ASPIRIN 81 MG PO CHEW
81.0000 mg | CHEWABLE_TABLET | Freq: Every day | ORAL | Status: DC
  Administered 2021-08-31 – 2021-09-06 (×7): 81 mg via ORAL
  Filled 2021-08-30 (×7): qty 1

## 2021-08-30 MED ORDER — SODIUM CHLORIDE 0.9% FLUSH: 3.0000 mL | INTRAVENOUS | Status: DC | PRN

## 2021-08-30 MED ORDER — ASPIRIN 81 MG PO CHEW
CHEWABLE_TABLET | ORAL | Status: AC
  Filled 2021-08-30: qty 3

## 2021-08-30 MED ORDER — BIVALIRUDIN TRIFLUOROACETATE 250 MG IV SOLR
INTRAVENOUS | Status: AC
  Filled 2021-08-30: qty 250

## 2021-08-30 MED ORDER — BIVALIRUDIN BOLUS VIA INFUSION - CUPID
INTRAVENOUS | Status: DC | PRN
  Administered 2021-08-30: 56.4 mg via INTRAVENOUS

## 2021-08-30 MED ORDER — METOPROLOL TARTRATE 50 MG PO TABS: 100.0000 mg | ORAL_TABLET | Freq: Two times a day (BID) | ORAL | Status: DC

## 2021-08-30 MED ORDER — HEPARIN (PORCINE) IN NACL 1000-0.9 UT/500ML-% IV SOLN
INTRAVENOUS | Status: DC | PRN
  Administered 2021-08-30: 1000 mL

## 2021-08-30 MED ORDER — MIDAZOLAM HCL 2 MG/2ML IJ SOLN
INTRAMUSCULAR | Status: AC
  Filled 2021-08-30: qty 2

## 2021-08-30 MED ORDER — CLOPIDOGREL BISULFATE 75 MG PO TABS
ORAL_TABLET | ORAL | Status: AC
  Filled 2021-08-30: qty 8

## 2021-08-30 MED ORDER — FENTANYL CITRATE (PF) 100 MCG/2ML IJ SOLN
INTRAMUSCULAR | Status: DC | PRN
  Administered 2021-08-30: 12.5 ug via INTRAVENOUS

## 2021-08-30 MED ORDER — ASPIRIN 81 MG PO CHEW
CHEWABLE_TABLET | ORAL | Status: DC | PRN
  Administered 2021-08-30: 243 mg via ORAL

## 2021-08-30 MED ORDER — ACETAMINOPHEN 325 MG PO TABS
650.0000 mg | ORAL_TABLET | ORAL | Status: DC | PRN
  Administered 2021-08-30 – 2021-09-05 (×5): 650 mg via ORAL
  Filled 2021-08-30 (×6): qty 2

## 2021-08-30 MED ORDER — SODIUM CHLORIDE 0.9 % WEIGHT BASED INFUSION: 1.0000 mL/kg/h | INTRAVENOUS | Status: DC

## 2021-08-30 MED ORDER — CLOPIDOGREL BISULFATE 75 MG PO TABS
75.0000 mg | ORAL_TABLET | Freq: Every day | ORAL | Status: DC
  Administered 2021-08-31 – 2021-09-06 (×7): 75 mg via ORAL
  Filled 2021-08-30 (×7): qty 1

## 2021-08-30 MED ORDER — SODIUM CHLORIDE 0.9 % IV SOLN
250.0000 mL | INTRAVENOUS | Status: DC | PRN
  Administered 2021-09-03: 250 mL via INTRAVENOUS

## 2021-08-30 MED ORDER — SODIUM CHLORIDE 0.9 % WEIGHT BASED INFUSION
3.0000 mL/kg/h | INTRAVENOUS | Status: DC
  Administered 2021-08-30: 3 mL/kg/h via INTRAVENOUS

## 2021-08-30 MED ORDER — ASPIRIN 81 MG PO CHEW: 81.0000 mg | CHEWABLE_TABLET | ORAL | Status: AC

## 2021-08-30 MED ORDER — LIDOCAINE HCL (PF) 1 % IJ SOLN
INTRAMUSCULAR | Status: DC | PRN
  Administered 2021-08-30: 20 mL

## 2021-08-30 MED ORDER — ACETAMINOPHEN 325 MG PO TABS
ORAL_TABLET | ORAL | Status: AC
  Filled 2021-08-30: qty 2

## 2021-08-30 MED ORDER — MIDAZOLAM HCL 2 MG/2ML IJ SOLN
INTRAMUSCULAR | Status: DC | PRN
  Administered 2021-08-30: .5 mg via INTRAVENOUS

## 2021-08-30 MED ORDER — HEPARIN (PORCINE) IN NACL 1000-0.9 UT/500ML-% IV SOLN
INTRAVENOUS | Status: AC
  Filled 2021-08-30: qty 1000

## 2021-08-30 MED ORDER — HYDRALAZINE HCL 20 MG/ML IJ SOLN: 10.0000 mg | INTRAMUSCULAR | Status: AC | PRN

## 2021-08-30 MED ORDER — METOPROLOL TARTRATE 50 MG PO TABS
100.0000 mg | ORAL_TABLET | Freq: Two times a day (BID) | ORAL | Status: DC
  Administered 2021-08-31 – 2021-09-06 (×13): 100 mg via ORAL
  Filled 2021-08-30 (×13): qty 2

## 2021-08-30 MED ORDER — LIDOCAINE HCL 1 % IJ SOLN
INTRAMUSCULAR | Status: AC
  Filled 2021-08-30: qty 20

## 2021-08-30 MED ORDER — ASPIRIN EC 81 MG PO TBEC: 81.0000 mg | DELAYED_RELEASE_TABLET | Freq: Every day | ORAL | Status: DC

## 2021-08-30 MED ORDER — ASPIRIN 81 MG PO CHEW: 81.0000 mg | CHEWABLE_TABLET | ORAL | Status: DC

## 2021-08-30 MED ORDER — SODIUM CHLORIDE 0.9 % WEIGHT BASED INFUSION
1.0000 mL/kg/h | INTRAVENOUS | Status: AC
  Administered 2021-08-30: 1 mL/kg/h via INTRAVENOUS

## 2021-08-30 MED ORDER — ONDANSETRON HCL 4 MG/2ML IJ SOLN
4.0000 mg | Freq: Four times a day (QID) | INTRAMUSCULAR | Status: DC | PRN
  Administered 2021-09-04: 4 mg via INTRAVENOUS
  Filled 2021-08-30: qty 2

## 2021-08-30 MED ORDER — SODIUM CHLORIDE 0.9% FLUSH
3.0000 mL | Freq: Two times a day (BID) | INTRAVENOUS | Status: DC
  Administered 2021-08-30 – 2021-09-06 (×14): 3 mL via INTRAVENOUS

## 2021-08-30 MED ORDER — CLOPIDOGREL BISULFATE 75 MG PO TABS
ORAL_TABLET | ORAL | Status: DC | PRN
  Administered 2021-08-30: 600 mg via ORAL

## 2021-08-30 MED ORDER — ASPIRIN 81 MG PO CHEW
CHEWABLE_TABLET | ORAL | Status: AC
  Administered 2021-08-30: 81 mg via ORAL
  Filled 2021-08-30: qty 1

## 2021-08-30 MED ORDER — FENTANYL CITRATE (PF) 100 MCG/2ML IJ SOLN
INTRAMUSCULAR | Status: AC
  Filled 2021-08-30: qty 2

## 2021-08-30 MED ORDER — SODIUM CHLORIDE 0.9 % IV SOLN: 250.0000 mL | INTRAVENOUS | Status: DC | PRN

## 2021-08-30 SURGICAL SUPPLY — 18 items
BALLN EUPHORA RX 2.0X15 (BALLOONS) ×2
BALLOON EUPHORA RX 2.0X15 (BALLOONS) ×1 IMPLANT
CATH INFINITI 5FR MULTPACK ANG (CATHETERS) ×2 IMPLANT
CATHETER LAUNCHER 6FR JR4 SH (CATHETERS) ×2 IMPLANT
DEVICE CLOSURE MYNXGRIP 6/7F (Vascular Products) ×2 IMPLANT
DEVICE SAFEGUARD 24CM (GAUZE/BANDAGES/DRESSINGS) ×2 IMPLANT
KIT ENCORE 26 ADVANTAGE (KITS) ×2 IMPLANT
NEEDLE PERC 18GX7CM (NEEDLE) ×2 IMPLANT
PACK CARDIAC CATH (CUSTOM PROCEDURE TRAY) ×2 IMPLANT
PROTECTION STATION PRESSURIZED (MISCELLANEOUS) ×2
SET ATX SIMPLICITY (MISCELLANEOUS) ×2 IMPLANT
SHEATH AVANTI 5FR X 11CM (SHEATH) ×2 IMPLANT
SHEATH AVANTI 6FR X 11CM (SHEATH) ×2 IMPLANT
STATION PROTECTION PRESSURIZED (MISCELLANEOUS) ×1 IMPLANT
STENT ONYX FRONTIER 2.25X15 (Permanent Stent) ×4 IMPLANT
TUBING CIL FLEX 10 FLL-RA (TUBING) ×2 IMPLANT
WIRE G HI TQ BMW 190 (WIRE) ×2 IMPLANT
WIRE GUIDERIGHT .035X150 (WIRE) ×2 IMPLANT

## 2021-08-30 NOTE — Progress Notes (Signed)
Progress Note    [name]   SJG:[id number]  DOB: 1926/11/20  DOA: 08/28/2021     2 Date of Service: 08/30/2021    Brief Narrative From H&P: "[name] a 87 y.o. femalewith medical history significant forGERD, hypertension, osteoarthritis, non-STEMI, chronic diastolic CHF, and atrial fibrillation on Eliquis, coming with acute onset cough with associated dyspnea without chest pain or palpitations. She denies any orthopnea or proximal nocturnal dyspnea. She has chronic significant lower extremity edema. No fever or chills."  She reported two weeks of diarrhea mostly after meals, but no nausea, vomiting or abdominal pain.  She had been prescribed Omnicef by PCP for presumed respiratory infection due to cough and feeling generally ill, had taken it for 3 days and initially improved prior to worsening again.  Remains with very coarse productive cough.  Admitted for further evaluation and management of NSTEMI, A-fib RVR, apparent decompensated CHF, possible PNA vs bronchitis.  Cardiology consulted.  Assessment and Plan * NSTEMI (non-ST elevated myocardial infarction) ([place]) POA with troponin peaking at 1554, then trended down.  Pt had acute onset dyspnea without chest pain.  Unclear if ACS or demand ischemia with A-fib RVR.   Left heart cath 12/2 - 2 stents placed, see cath report.  --Mgmt per cardiology --Off heparin drip post-cath --Resumption of Eliquis per cardiology --ASA and Plavix --Continue statin --IV Diuresis  --Follow up pending Echo report --Monitor and replace electrolytes --EKG and repeat troponin if recurrent chest pain --Telemetry  Hypokalemia Not POA, likely due to diuresis. K 3.1 on AM labs 12/1 - replaced. Monitor and replace to keep K>4. Also monitor and replace Mg.  Acute systolic CHF (congestive heart failure) ([place]) Cardiac cath on 12/2 revealed reduced LV EF of 35-40%.  2D echo report is pending. --Cardiology managing --On diuresis --Continue  metoprolol --Strict I/O's and daily weights --Monitor renal function and electrolytes  Atrial fibrillation with RVR ([facility]) POA with HR's up to 130's-140's. Likely driven by NSTEMI, CHF, ?infection. Given Cardizem IV bolus in ED. --Cardiology following, see their recs --Stop Cardizem given reduced EF --Increase Lopressor 50>>100 mg PO BID --Telemetry --Maintain K>4, Mg>2   Acute respiratory failure with hypoxia ([place]) Present on admission - pt presented with dyspnea, tachypnea.  Requiring 2 L/min supplemental O2 to maintain spO2 > 90%. Due to A-fib RVR, NSTEMI and likely acute on chronic CHF, possible respiratory infection as well with very productive cough. CTA chest negative for PE, showed chronic changes and sequelae of prior infection in R upper/middle lobes with bronchiectasis. 12/2 - weaned to room air aside from during heart cath --Supplement O2 to keep sats > 90% --Wean O2 as tolerated --Mgmt of underlying conditions as below  Community acquired pneumonia POA - pt has very productive cough with thick yellow sputum. Had taken 3 days of Omnicef prescribed by PCP with some improvement intially. --continue empiric Rocephin, Zithromax --sputum cx --mucolytics --supplement O2 as above PRN  Diarrhea POA - pt reported diarrhea following meals for about two weeks, onset before she was started on antibiotics outpatient. C diff and GI panel were negative --d/c enteric precautions  --monitor   GERD (gastroesophageal reflux disease) Chronic, stable.  Continue PPI.  Depression Continue home Lexapro    Subjective:  Patient seen awake resting in bed after cardiac cath this afternoon.  She reports overall breathing is better.  She continues to have a quite productive cough.  No fevers or chills.  Denies any chest pain or other acute complaints at this time.  Objective  Vitals:   08/30/21 1230 08/30/21 1316 08/30/21 1402 08/30/21 1434  BP: 123/80 125/85 103/77 101/69  Pulse: (!)  117 (!) 119 (!) 116 (!) 107  Resp: 20 19 (!) 22 (!) 22  Temp:  98 F (36.7 C)    TempSrc:  Oral    SpO2: 91% 97% 94% 96%  Weight:      Height:       75.2 kg  Vital signs were reviewed and unremarkable except for: Heart rate in 110's, borderline SPO2, intermittently tachypneic   Exam General exam: awake, alert, no acute distress Respiratory system: Diffuse rhonchi, no wheezes, normal respiratory effort, receiving neb treatment. Cardiovascular system: normal S1/S2, irregularly irregular, 3+ lower extremity pitting edema bilaterally.   Gastrointestinal system: soft, nontender abdomen Central nervous system: A&O x3. no gross focal neurologic deficits, normal speech Extremities: Pitting edema bilateral lower extremities, mild bilateral lower extremity erythema without differential warmth Psychiatry: normal mood, congruent affect, judgement and insight appear normal  Labs / Other Information My review of labs, imaging, notes and other tests is significant for Albumin 2.7, troponin further trended down 832,     Disposition Plan: Status is: Inpatient  Remains inpatient appropriate because: Remains on IV therapies as outlined above.  Cardiac cath with stents placed today warrants close inpatient monitoring.        Time spent: 30 minutes with > 50% spent at bedside and in coordination of care  [facility] 08/30/2021, 2:43 PM

## 2021-08-30 NOTE — Progress Notes (Incomplete)
[facility]    SUBJECTIVE: ***   Vitals:   08/29/21 2033 08/30/21 0038 08/30/21 0428 08/30/21 0525  BP:  111/69 126/79   Pulse:  97 94   Resp:  15 16   Temp:  97.7 F (36.5 C) 97.8 F (36.6 C)   TempSrc:      SpO2: 100% 96% 94%   Weight:    75.2 kg  Height:         Intake/Output Summary (Last 24 hours) at 08/30/2021 7209 Last data filed at 08/30/2021 0330 Gross per 24 hour  Intake 521.67 ml  Output 2000 ml  Net -1478.33 ml      PHYSICAL EXAM  General: Well developed, well nourished, in no acute distress HEENT:  Normocephalic and atramatic Neck:  No JVD.  Lungs: Clear bilaterally to auscultation and percussion. Heart: HRRR . Normal S1 and S2 without gallops or murmurs.  Abdomen: Bowel sounds are positive, abdomen soft and non-tender  Msk:  Back normal, normal gait. Normal strength and tone for age. Extremities: No clubbing, cyanosis or edema.   Neuro: Alert and oriented X 3. Psych:  Good affect, responds appropriately   LABS: Basic Metabolic Panel: Recent Labs    08/29/21 0606 08/30/21 0503  NA 135 138  K 3.1* 3.9  CL 106 105  CO2 22 26  GLUCOSE 89 90  BUN 23 22  CREATININE 0.79 1.00  CALCIUM 8.3* 8.7*  MG  --  1.7   Liver Function Tests: Recent Labs    08/28/21 2022 08/30/21 0503  AST 40 26  ALT 27 20  ALKPHOS 60 58  BILITOT 0.7 0.5  PROT 6.7 5.7*  ALBUMIN 3.6 2.7*   No results for input(s): LIPASE, AMYLASE in the last 72 hours. CBC: Recent Labs    08/28/21 2022 08/29/21 0606 08/30/21 0503  WBC 8.0 6.6 6.2  NEUTROABS 5.5  --   --   HGB 13.4 12.4 12.6  HCT 41.5 38.2 39.5  MCV 95.0 94.1 95.2  PLT 210 185 199   Cardiac Enzymes: No results for input(s): CKTOTAL, CKMB, CKMBINDEX, TROPONINI in the last 72 hours. BNP: Invalid input(s): POCBNP D-Dimer: No results for input(s): DDIMER in the last 72 hours. Hemoglobin A1C: No results for input(s): HGBA1C in the last 72 hours. Fasting Lipid Panel: Recent Labs    08/29/21 0606   CHOL 124  HDL 36*  LDLCALC 67  TRIG 107  CHOLHDL 3.4   Thyroid Function Tests: No results for input(s): TSH, T4TOTAL, T3FREE, THYROIDAB in the last 72 hours.  Invalid input(s): FREET3 Anemia Panel: No results for input(s): VITAMINB12, FOLATE, FERRITIN, TIBC, IRON, RETICCTPCT in the last 72 hours.  DG Chest 2 View  Result Date: 08/28/2021 CLINICAL DATA:  Shortness of breath. EXAM: CHEST - 2 VIEW COMPARISON:  August 27, 2020. FINDINGS: Stable cardiomegaly. No pneumothorax is noted. Mild bibasilar subsegmental atelectasis is noted. Bony thorax is unremarkable. IMPRESSION: Mild bibasilar subsegmental atelectasis. Electronically Signed   By: Marijo Conception M.D.   On: 08/28/2021 16:06   CT Angio Chest PE W and/or Wo Contrast  Result Date: 08/28/2021 CLINICAL DATA:  Shortness of breath EXAM: CT ANGIOGRAPHY CHEST WITH CONTRAST TECHNIQUE: Multidetector CT imaging of the chest was performed using the standard protocol during bolus administration of intravenous contrast. Multiplanar CT image reconstructions and MIPs were obtained to evaluate the vascular anatomy. CONTRAST:  29mL OMNIPAQUE IOHEXOL 350 MG/ML SOLN COMPARISON:  Chest x-ray 08/28/2021, CT chest 08/27/2020, CT chest 03/05/2009 FINDINGS: Cardiovascular: Satisfactory opacification of  the pulmonary arteries to the segmental level. No evidence of pulmonary embolism. Mild aortic atherosclerosis. No aneurysm. Coronary vascular calcification. Borderline cardiomegaly. No pericardial effusion. Mediastinum/Nodes: Midline trachea. Mild mediastinal adenopathy, right paratracheal lymph node measures 15 mm. Subcarinal node measures 17 mm. Small right hilar nodes. Esophagus within normal limits. Lungs/Pleura: Apical scarring with mild bronchiectasis in the right upper and middle lobes. Chronic scarring in the right upper lobe. No definite acute superimposed airspace disease. No pleural effusion or pneumothorax. Upper Abdomen: No acute abnormality.  Musculoskeletal: No chest wall abnormality. No acute or significant osseous findings. Review of the MIP images confirms the above findings. IMPRESSION: 1. Negative for acute pulmonary embolus. 2. Chronic lung disease and sequelae of prior infection most evident in the right upper and middle lobes. No definite acute superimposed airspace disease. 3. Mild mediastinal adenopathy nonspecific, possibly reactive. Aortic Atherosclerosis (ICD10-I70.0). Electronically Signed   By: Donavan Foil M.D.   On: 08/28/2021 23:16     Echo ***  TELEMETRY: ***:  ASSESSMENT AND PLAN:  Principal Problem:   NSTEMI (non-ST elevated myocardial infarction) ([place]) Active Problems:   GERD (gastroesophageal reflux disease)   Acute respiratory failure with hypoxia ([place])   Community acquired pneumonia   Atrial fibrillation with RVR ([place])   Diarrhea   Depression   Hypokalemia    1. ***   Yolonda Kida, MD 08/30/2021 7:26 AM

## 2021-08-30 NOTE — Assessment & Plan Note (Signed)
POA - pt reported diarrhea following meals for about two weeks, onset before she was started on antibiotics outpatient. C diff and GI panel were negative --d/c enteric precautions  --monitor

## 2021-08-30 NOTE — Progress Notes (Signed)
ANTICOAGULATION CONSULT NOTE   Pharmacy Consult for heparin infusion  Indication: ACS/STEMI  Patient Measurements: Height: 5' 5.5" (166.4 cm) Weight: 75.2 kg (165 lb 12.6 oz) IBW/kg (Calculated) : 58.15 Heparin Dosing Weight: 70.3 kg  Vital Signs: Temp: 98 F (36.7 C) (12/02 0733) BP: 128/67 (12/02 0733) Pulse Rate: 100 (12/02 0733)  Labs: Recent Labs    08/28/21 2022 08/28/21 2022 08/28/21 2201 08/29/21 0606 08/29/21 0900 08/29/21 1939 08/30/21 0503  HGB 13.4  --   --  12.4  --   --  12.6  HCT 41.5  --   --  38.2  --   --  39.5  PLT 210  --   --  185  --   --  199  APTT  --    < > 33  --  93* 82* 114*  LABPROT  --   --  16.7*  --   --   --   --   INR  --   --  1.4*  --   --   --   --   HEPARINUNFRC  --   --  >1.10*  --   --  >1.10*  --   CREATININE 0.88  --   --  0.79  --   --  1.00  TROPONINIHS 1,478*  --  1,554* 1,110*  --   --  832*   < > = values in this interval not displayed.     Estimated Creatinine Clearance: 36.8 mL/min (by C-G formula based on SCr of 1 mg/dL).   Medical History: Past Medical History:  Diagnosis Date   Allergic rhinitis    Arthritis    Colon polyp    Edema    Esophageal spasm    GERD (gastroesophageal reflux disease)    Hypertension     Medications:  PTA:  Eliquis 2.5 mg BID  Assessment: Pt is 87 yo female presenting to ED w/ SOB, found with elevated. troponin. Pharmacy consulted to manage heparin infusion for ACS (NSTEMI). Cardiology is following and planning a cath in the morning.  12/1 1939 aPTT 82 Heparin level > 1.1 12/2 0503 aPTT 114   Goal of Therapy:  Heparin level 0.3-0.7 units/ml once aPTT and heparin level correlate.  aPTT 66-102 seconds Monitor platelets by anticoagulation protocol: Yes   Plan:  aPTT is supratherapeutic. Will decrease heparin infusion to 800 units/hr. Recheck aPTT in 8 hours. CBC and heparin level daily while on heparin. Once heparin level and aPTT correlate will switch to heparin level.    Eleonore Chiquito, PharmD, BCPS 08/30/2021 7:48 AM

## 2021-08-30 NOTE — Assessment & Plan Note (Signed)
Continue home Lexapro. 

## 2021-08-30 NOTE — Progress Notes (Addendum)
Initial Nutrition Assessment  DOCUMENTATION CODES:  Non-severe (moderate) malnutrition in context of chronic illness  INTERVENTION:  Add Ensure Plus High Protein po TID, each supplement provides 350 kcal and 20 grams of protein.   Add MVI with minerals daily.  Add assistance with ordering meals - RD to order.  Encourage PO and supplement intake.  NUTRITION DIAGNOSIS:  Moderate Malnutrition related to chronic illness (CHF) as evidenced by mild fat depletion, moderate fat depletion, mild muscle depletion, moderate muscle depletion.  GOAL:  Patient will meet greater than or equal to 90% of their needs  MONITOR:  PO intake, Supplement acceptance, Labs, Weight trends, Skin, I & O's  REASON FOR ASSESSMENT:  Malnutrition Screening Tool    ASSESSMENT:  87 yo female with a PMH of GERD, HTN, osteoarthritis, non-STEMI, chronic diastolic CHF, and A-fib presenting with acute onset cough with associated dyspnea. Admitted with STEMI.  Spoke with pt at bedside. Pt still a bit sleepy from cath lab visit this morning.  Pt reports being hungry and wanting dinner. Went and retrieved a menu for patient in preparation for dinner.  She reports that before she came to the hospital, she was not eating very well due to poor appetite.  Pt does like Ensure and would like to receive some here.  Pt also reported some diarrhea after eating meals PTA. Not c.diff per panel.  Per Epic, weight is up from recent years, however no recent weight history.  Of note, pt with severe RLE edema, which may contributing to the weight gain and masking loss.  Recommend Ensure TID and MVI with minerals daily.  Medications: reviewed; Os-Cal with Vitamin D, Lasix BID, Protonix, NaCl @ 10 ml/hr  Labs: reviewed  NUTRITION - FOCUSED PHYSICAL EXAM: Flowsheet Row Most Recent Value  Orbital Region Mild depletion  Upper Arm Region Moderate depletion  Thoracic and Lumbar Region No depletion  Buccal Region Mild depletion   Temple Region Moderate depletion  Clavicle Bone Region Mild depletion  Clavicle and Acromion Bone Region Mild depletion  Scapular Bone Region Unable to assess  Dorsal Hand Moderate depletion  Patellar Region Mild depletion  Anterior Thigh Region Mild depletion  Posterior Calf Region Moderate depletion  Edema (RD Assessment) Severe  [RLE]  Hair Reviewed  Eyes Reviewed  Mouth Reviewed  Skin Reviewed  Nails Reviewed   Diet Order:   Diet Order             Diet Heart Room service appropriate? Yes with Assist; Fluid consistency: Thin  Diet effective now                  EDUCATION NEEDS:  Education needs have been addressed  Skin:  Skin Assessment: Reviewed RN Assessment  Last BM:  08/28/21  Height:  Ht Readings from Last 1 Encounters:  08/28/21 5' 5.5" (1.664 m)   Weight:  Wt Readings from Last 1 Encounters:  08/30/21 75.2 kg   BMI:  Body mass index is 27.17 kg/m.  Estimated Nutritional Needs:  Kcal:  2595-6387 Protein:  90-105 grams Fluid:  >1.75 L  Derrel Nip, RD, LDN (she/her/hers) Clinical Inpatient Dietitian RD Pager/After-Hours/Weekend Pager # in [place]

## 2021-08-30 NOTE — Assessment & Plan Note (Signed)
Cardiac cath on 12/2 revealed reduced LV EF of 35-40%.  2D echo report is pending. --Cardiology managing --On diuresis --Continue metoprolol --Strict I/O's and daily weights --Monitor renal function and electrolytes

## 2021-08-30 NOTE — Care Management Important Message (Signed)
Important Message  Patient Details  Name: [name] MRN: [id number] Date of Birth: [birth year]   Medicare Important Message Given:  N/A - LOS <3 / Initial given by admissions     Dannette Barbara 08/30/2021, 1:25 PM

## 2021-08-30 NOTE — Assessment & Plan Note (Signed)
POA with troponin peaking at 1554, then trended down.  Pt had acute onset dyspnea without chest pain.  Unclear if ACS or demand ischemia with A-fib RVR.   Left heart cath 12/2 - 2 stents placed, see cath report.  --Mgmt per cardiology --Off heparin drip post-cath --Resumption of Eliquis per cardiology --ASA and Plavix --Continue statin --IV Diuresis  --Follow up pending Echo report --Monitor and replace electrolytes --EKG and repeat troponin if recurrent chest pain --Telemetry

## 2021-08-30 NOTE — Assessment & Plan Note (Signed)
Not POA, likely due to diuresis. K 3.1 on AM labs 12/1 - replaced. Monitor and replace to keep K>4. Also monitor and replace Mg.

## 2021-08-30 NOTE — Assessment & Plan Note (Signed)
Present on admission - pt presented with dyspnea, tachypnea.  Requiring 2 L/min supplemental O2 to maintain spO2 > 90%. Due to A-fib RVR, NSTEMI and likely acute on chronic CHF, possible respiratory infection as well with very productive cough. CTA chest negative for PE, showed chronic changes and sequelae of prior infection in R upper/middle lobes with bronchiectasis. 12/2 - weaned to room air aside from during heart cath --Supplement O2 to keep sats > 90% --Wean O2 as tolerated --Mgmt of underlying conditions as below

## 2021-08-30 NOTE — Assessment & Plan Note (Signed)
POA with HR's up to 130's-140's. Likely driven by NSTEMI, CHF, ?infection. Given Cardizem IV bolus in ED. --Cardiology following, see their recs --Stop Cardizem given reduced EF --Increase Lopressor 50>>100 mg PO BID --Telemetry --Maintain K>4, Mg>2

## 2021-08-30 NOTE — Assessment & Plan Note (Signed)
POA - pt has very productive cough with thick yellow sputum. Had taken 3 days of Omnicef prescribed by PCP with some improvement intially. --continue empiric Rocephin, Zithromax --sputum cx --mucolytics --supplement O2 as above PRN

## 2021-08-30 NOTE — Assessment & Plan Note (Signed)
Chronic, stable.  Continue PPI.

## 2021-08-31 DIAGNOSIS — I214 Non-ST elevation (NSTEMI) myocardial infarction: Secondary | ICD-10-CM | POA: Diagnosis not present

## 2021-08-31 DIAGNOSIS — E440 Moderate protein-calorie malnutrition: Secondary | ICD-10-CM | POA: Insufficient documentation

## 2021-08-31 LAB — CBC
HCT: 34.3 % — ABNORMAL LOW (ref 36.0–46.0)
Hemoglobin: 11.3 g/dL — ABNORMAL LOW (ref 12.0–15.0)
MCH: 31.2 pg (ref 26.0–34.0)
MCHC: 32.9 g/dL (ref 30.0–36.0)
MCV: 94.8 fL (ref 80.0–100.0)
Platelets: 235 10*3/uL (ref 150–400)
RBC: 3.62 MIL/uL — ABNORMAL LOW (ref 3.87–5.11)
RDW: 14.1 % (ref 11.5–15.5)
WBC: 7.6 10*3/uL (ref 4.0–10.5)
nRBC: 0 % (ref 0.0–0.2)

## 2021-08-31 LAB — BASIC METABOLIC PANEL
Anion gap: 7 (ref 5–15)
BUN: 19 mg/dL (ref 8–23)
CO2: 25 mmol/L (ref 22–32)
Calcium: 8.5 mg/dL — ABNORMAL LOW (ref 8.9–10.3)
Chloride: 103 mmol/L (ref 98–111)
Creatinine, Ser: 0.84 mg/dL (ref 0.44–1.00)
GFR, Estimated: >60 mL/min (ref >60)
Glucose, Bld: 109 mg/dL — ABNORMAL HIGH (ref 70–99)
Potassium: 3.8 mmol/L (ref 3.5–5.1)
Sodium: 135 mmol/L (ref 135–145)

## 2021-08-31 LAB — MAGNESIUM: Magnesium: 1.8 mg/dL (ref 1.7–2.4)

## 2021-08-31 MED ORDER — APIXABAN 2.5 MG PO TABS
2.5000 mg | ORAL_TABLET | Freq: Two times a day (BID) | ORAL | Status: DC
  Administered 2021-08-31 – 2021-09-06 (×13): 2.5 mg via ORAL
  Filled 2021-08-31 (×13): qty 1

## 2021-08-31 MED ORDER — ENSURE ENLIVE PO LIQD
237.0000 mL | Freq: Three times a day (TID) | ORAL | Status: DC
  Administered 2021-08-31 – 2021-09-06 (×16): 237 mL via ORAL

## 2021-08-31 MED ORDER — ADULT MULTIVITAMIN W/MINERALS CH
1.0000 | ORAL_TABLET | Freq: Every day | ORAL | Status: DC
  Administered 2021-08-31 – 2021-09-06 (×6): 1 via ORAL
  Filled 2021-08-31 (×7): qty 1

## 2021-08-31 NOTE — Assessment & Plan Note (Signed)
POA with HR's up to 130's-140's. Likely driven by NSTEMI, CHF, ?infection. Given Cardizem IV bolus in ED. --Cardiology following, see their recs --Continue increased Lopressor 100 mg PO BID --Stop Cardizem given reduced EF, consider low dose if needed for rate control as BP allows --Eliquis 2.5 mg PO BID --Telemetry --Maintain K>4, Mg>2

## 2021-08-31 NOTE — Progress Notes (Signed)
Progress Note    [name]   OEU:[id number]  DOB: [birth year]  DOA: 08/28/2021     3 Date of Service: 08/31/2021   Brief Narrative From H&P: "[name] a 87 y.o. femalewith medical history significant forGERD, hypertension, osteoarthritis, non-STEMI, chronic diastolic CHF, and atrial fibrillation on Eliquis, coming with acute onset cough with associated dyspnea without chest pain or palpitations. She denies any orthopnea or proximal nocturnal dyspnea. She has chronic significant lower extremity edema. No fever or chills."  She reported two weeks of diarrhea mostly after meals, but no nausea, vomiting or abdominal pain. She had been prescribed Omnicef by PCP for presumed respiratory infection due to cough and feeling generally ill, had taken it for 3 days and initially improved prior to worsening again. Remains with very coarse productive cough.  Admitted for further evaluation and management of NSTEMI, A-fib RVR, apparent decompensated CHF, possible PNA vs bronchitis. Cardiology consulted.  Assessment and Plan * NSTEMI (non-ST elevated myocardial infarction) ([place]) POA with troponin peaking at 1554, then trended down.  Pt had acute onset dyspnea without chest pain.  Unclear if ACS or demand ischemia with A-fib RVR.   Placed on heparin drip on admission pending cath. Underwent cardiac cath on 08/30/21 - 2 stents placed, see cath report.  --Mgmt per cardiology --Low dose Eliquis per cardiology --ASA and Plavix --Continue statin --IV Diuresis  --Monitor and replace electrolytes --Telemetry  Malnutrition of moderate degree As evidenced by mild-moderate fat and muscle depletion.  Related to CHF. Dietitian following. Ensure supplement drinks, multivitamin  Hypokalemia Not POA, likely due to diuresis. K 3.1 on AM labs 12/1 - replaced. Monitor and replace to keep K>4. Also monitor and replace Mg.  Acute systolic CHF (congestive heart failure) ([place]) Cardiac cath on 12/2  revealed reduced LV EF of 35-40%.   2D echo 12/1 showed EF 55-60% with regional [facility] --Cardiology managing --On IV diuresis --Continue metoprolol --Consider addition of low dose Cardizem if needed for HR control as BP allows --Strict I/O's and daily weights --Monitor renal function and electrolytes  Atrial fibrillation with RVR (HCC) POA with HR's up to 130's-140's. Likely driven by NSTEMI, CHF, ?infection. Given Cardizem IV bolus in ED. --Cardiology following, see their recs --Continue increased Lopressor 100 mg PO BID --Stop Cardizem given reduced EF, consider low dose if needed for rate control as BP allows --Eliquis 2.5 mg PO BID --Telemetry --Maintain K>4, Mg>2   Acute respiratory failure with hypoxia ([place]) Present on admission - pt presented with dyspnea, tachypnea.  Requiring 2 L/min supplemental O2 to maintain spO2 > 90%. Due to A-fib RVR, NSTEMI and likely acute on chronic CHF, possible respiratory infection as well with very productive cough. CTA chest negative for PE, showed chronic changes and sequelae of prior infection in R upper/middle lobes with bronchiectasis. 12/2 - weaned to room air --Supplement O2 to keep sats > 90% --Wean O2 as tolerated --Mgmt of underlying conditions as outlined  Community acquired pneumonia POA - pt has very productive cough with thick yellow sputum. Had taken 3 days of Omnicef prescribed by PCP with some improvement intially. --continue empiric Rocephin, Zithromax --sputum cx - pending --mucolytics --supplement O2 as above PRN  Diarrhea POA - pt reported diarrhea following meals for about two weeks, onset before she was started on antibiotics outpatient. C diff and GI panel were negative --d/c enteric precautions  --monitor   GERD (gastroesophageal reflux disease) Chronic, stable.  Continue PPI.  Depression Continue home Lexapro     Subjective:  Pt feeling a little better today.  Still having productive cough, only  slightly improved.  No fever, chills, chest pain or SOB at rest.    Objective Vitals:   08/31/21 0736 08/31/21 0758 08/31/21 0949 08/31/21 1206  BP:  131/68 110/65 116/81  Pulse: (!) 114 (!) 117 (!) 102 95  Resp: 18 18 18 18   Temp:  97.9 F (36.6 C) 99.6 F (37.6 C) 97.9 F (36.6 C)  TempSrc:   Oral   SpO2: 95% 95% 96% 94%  Weight:      Height:       74.8 kg  Vital signs were reviewed and unremarkable except for: Pulse: Tachycardic with A. fib RVR    Exam General exam: awake, alert, no acute distress HEENT: moist mucus membranes, hearing grossly normal  Respiratory system: Diffuse rhonchi, coarse productive sounding cough,, normal respiratory effort, on room air. Cardiovascular system: normal S1/S2, irregularly irregular Central nervous system: A&O x3. no gross focal neurologic deficits, normal speech Extremities: Persistent 2-3+ pitting edema of bilateral lower extremities, mild erythema of the distal lower extremities without differential warmth Psychiatry: normal mood, congruent affect, judgement and insight appear normal   Labs / Other Information My review of labs, imaging, notes and other tests is significant for Glucose 109, hemoglobin 11.3     Disposition Plan: Status is: Inpatient  Remains inpatient appropriate because: Severity of illness still undergoing IV diuresis        Time spent: 30 minutes with > 50% spent at bedside and in coordination of care  [facility] 08/31/2021, 4:28 PM

## 2021-08-31 NOTE — Assessment & Plan Note (Signed)
POA - pt reported diarrhea following meals for about two weeks, onset before she was started on antibiotics outpatient. C diff and GI panel were negative --d/c enteric precautions  --monitor

## 2021-08-31 NOTE — Assessment & Plan Note (Signed)
Chronic, stable.  Continue PPI.

## 2021-08-31 NOTE — Assessment & Plan Note (Signed)
POA - pt has very productive cough with thick yellow sputum. Had taken 3 days of Omnicef prescribed by PCP with some improvement intially. --continue empiric Rocephin, Zithromax --sputum cx - pending --mucolytics --supplement O2 as above PRN

## 2021-08-31 NOTE — Progress Notes (Signed)
[facility] Cardiology    SUBJECTIVE: Patient states he is stable still feels sick but somewhat improved from initial presentation right groin appears to have no issues heart rate is irregular no shortness of breath no leg swelling   Vitals:   08/31/21 0005 08/31/21 0416 08/31/21 0436 08/31/21 0736  BP: 103/73 117/66    Pulse: (!) 104 (!) 102  (!) 114  Resp: 18 18  18   Temp: 98.1 F (36.7 C) 98.2 F (36.8 C)    TempSrc:      SpO2: 95% 95%  95%  Weight:   74.8 kg   Height:         Intake/Output Summary (Last 24 hours) at 08/31/2021 0744 Last data filed at 08/30/2021 2100 Gross per 24 hour  Intake 834.46 ml  Output 150 ml  Net 684.46 ml      PHYSICAL EXAM  General: Well developed, well nourished, in no acute distress HEENT:  Normocephalic and atramatic Neck:  No JVD.  Lungs: Clear bilaterally to auscultation and percussion. Heart: Irregular irregular. Normal S1 and S2 without gallops or murmurs.  Abdomen: Bowel sounds are positive, abdomen soft and non-tender  Msk:  Back normal, normal gait. Normal strength and tone for age. Extremities: No clubbing, cyanosis or edema.   Neuro: Alert and oriented X 3. Psych:  Good affect, responds appropriately   LABS: Basic Metabolic Panel: Recent Labs    08/30/21 0503 08/31/21 0555  NA 138 135  K 3.9 3.8  CL 105 103  CO2 26 25  GLUCOSE 90 109*  BUN 22 19  CREATININE 1.00 0.84  CALCIUM 8.7* 8.5*  MG 1.7 1.8   Liver Function Tests: Recent Labs    08/28/21 2022 08/30/21 0503  AST 40 26  ALT 27 20  ALKPHOS 60 58  BILITOT 0.7 0.5  PROT 6.7 5.7*  ALBUMIN 3.6 2.7*   No results for input(s): LIPASE, AMYLASE in the last 72 hours. CBC: Recent Labs    08/28/21 2022 08/29/21 0606 08/30/21 0503 08/31/21 0555  WBC 8.0   < > 6.2 7.6  NEUTROABS 5.5  --   --   --   HGB 13.4   < > 12.6 11.3*  HCT 41.5   < > 39.5 34.3*  MCV 95.0   < > 95.2 94.8  PLT 210   < > 199 235   < > = values in this interval not displayed.   Cardiac  Enzymes: No results for input(s): CKTOTAL, CKMB, CKMBINDEX, TROPONINI in the last 72 hours. BNP: Invalid input(s): POCBNP D-Dimer: No results for input(s): DDIMER in the last 72 hours. Hemoglobin A1C: No results for input(s): HGBA1C in the last 72 hours. Fasting Lipid Panel: Recent Labs    08/29/21 0606  CHOL 124  HDL 36*  LDLCALC 67  TRIG 107  CHOLHDL 3.4   Thyroid Function Tests: No results for input(s): TSH, T4TOTAL, T3FREE, THYROIDAB in the last 72 hours.  Invalid input(s): FREET3 Anemia Panel: No results for input(s): VITAMINB12, FOLATE, FERRITIN, TIBC, IRON, RETICCTPCT in the last 72 hours.  CARDIAC CATHETERIZATION  Result Date: 08/30/2021   Mid LM to Dist LM lesion is 35% stenosed.   Prox LAD lesion is 50% stenosed.   Prox RCA lesion is 95% stenosed.   Mid RCA lesion is 75% stenosed.   A stent was successfully placed.   A stent was successfully placed.   Post intervention, there is a 0% residual stenosis.   Post intervention, there is a 0% residual stenosis.  There is mild to moderate left ventricular systolic dysfunction.   LV end diastolic pressure is mildly elevated.   The left ventricular ejection fraction is 35-45% by visual estimate.   There is no mitral valve regurgitation. Conclusion Diagnostic cardiac cath because of non-STEMI and heart failure Left ventriculogram shows anterior  hypokinesis Ejection fraction of around 35 to 40% ejection fraction of around 35-40 Coronaries Left main distal 25% LAD large mid 50% diagonal 1 bifurcation Circumflex relatively free of disease RCA large proximal 95 mid 75% IRA vessel TIMI-3 flow in all vessels pre and postintervention Intervention Successful PCI and stent of mid RCA 2.25 mmx31mm Onyx  frontier Lesion reduced from 75 to 0% Successful PCI stent of proximal RCA lesion 2.25 x 15 mm Onyx frontier Reduce lesion from 95 down to 0% Placed on on Angiomax Plavix aspirin Minx deployed for hemostasis   ECHOCARDIOGRAM COMPLETE  Result  Date: 08/30/2021    ECHOCARDIOGRAM REPORT   Patient Name:   [name] Date of Exam: 08/29/2021 Medical Rec #:  [id number]  Height:       65.5 in Accession #:    [id number] Weight:       155.0 lb Date of Birth:  May 13, 1927  BSA:          1.785 m Patient Age:    87 years   BP:           124/80 mmHg Patient Gender: F          HR:           140 bpm. Exam Location:  [facility] Procedure: 2D Echo, Cardiac Doppler and Color Doppler Indications:     NSTEMI I21.4  History:         Patient has prior history of Echocardiogram examinations, most                  recent 08/28/2020. Risk Factors:Hypertension.  Sonographer:     Sherrie Sport Referring Phys:  [id number] JAN A MANSY Diagnosing Phys: Yolonda Kida MD  Sonographer Comments: Suboptimal apical window. IMPRESSIONS  1. Anterior Hypo with Preserved LVF.  2. Left ventricular ejection fraction, by estimation, is 55 to 60%. The left ventricle has normal function. The left ventricle demonstrates regional wall motion abnormalities (see scoring diagram/findings for description). Left ventricular diastolic function could not be evaluated.  3. Right ventricular systolic function is normal. The right ventricular size is normal.  4. The mitral valve is normal in structure. No evidence of mitral valve regurgitation.  5. The aortic valve is normal in structure. Aortic valve regurgitation is not visualized. FINDINGS  Left Ventricle: Left ventricular ejection fraction, by estimation, is 55 to 60%. The left ventricle has normal function. The left ventricle demonstrates regional wall motion abnormalities. The left ventricular internal cavity size was normal in size. There is no left ventricular hypertrophy. Left ventricular diastolic function could not be evaluated. Right Ventricle: The right ventricular size is normal. No increase in right ventricular wall thickness. Right ventricular systolic function is normal. Left Atrium: Left atrial size was normal in size. Right Atrium: Right atrial size  was normal in size. Pericardium: There is no evidence of pericardial effusion. Mitral Valve: The mitral valve is normal in structure. No evidence of mitral valve regurgitation. MV peak gradient, 6.7 mmHg. The mean mitral valve gradient is 3.0 mmHg. Tricuspid Valve: The tricuspid valve is normal in structure. Tricuspid valve regurgitation is not demonstrated. Aortic Valve: The aortic valve is normal in structure. Aortic valve regurgitation  is not visualized. Aortic valve mean gradient measures 3.0 mmHg. Aortic valve peak gradient measures 4.2 mmHg. Aortic valve area, by VTI measures 2.92 cm. Pulmonic Valve: The pulmonic valve was normal in structure. Pulmonic valve regurgitation is not visualized. Aorta: The ascending aorta was not well visualized. IAS/Shunts: No atrial level shunt detected by color flow Doppler. Additional Comments: Anterior Hypo with Preserved LVF.  LEFT VENTRICLE PLAX 2D LVIDd:         4.40 cm LVIDs:         3.10 cm LV PW:         1.50 cm LV IVS:        1.00 cm LVOT diam:     2.00 cm LV SV:         42 LV SV Index:   24 LVOT Area:     3.14 cm  RIGHT VENTRICLE RV S prime:     11.20 cm/s TAPSE (M-mode): 2.8 cm LEFT ATRIUM             Index        RIGHT ATRIUM           Index LA diam:        3.90 cm 2.18 cm/m   RA Area:     16.50 cm LA Vol (A2C):   35.4 ml 19.83 ml/m  RA Volume:   38.60 ml  21.62 ml/m LA Vol (A4C):   55.8 ml 31.26 ml/m LA Biplane Vol: 47.0 ml 26.33 ml/m  AORTIC VALVE                    PULMONIC VALVE AV Area (Vmax):    2.75 cm     PV Vmax:        0.96 m/s AV Area (Vmean):   2.64 cm     PV Vmean:       67.600 cm/s AV Area (VTI):     2.92 cm     PV VTI:         0.131 m AV Vmax:           103.00 cm/s  PV Peak grad:   3.7 mmHg AV Vmean:          74.800 cm/s  PV Mean grad:   2.0 mmHg AV VTI:            0.144 m      RVOT Peak grad: 5 mmHg AV Peak Grad:      4.2 mmHg AV Mean Grad:      3.0 mmHg LVOT Vmax:         90.25 cm/s LVOT Vmean:        62.750 cm/s LVOT VTI:          0.134 m  LVOT/AV VTI ratio: 0.93  AORTA Ao Root diam: 3.40 cm MITRAL VALVE               TRICUSPID VALVE MV Area (PHT): 4.74 cm    TR Peak grad:   35.8 mmHg MV Area VTI:   2.48 cm    TR Vmax:        299.00 cm/s MV Peak grad:  6.7 mmHg MV Mean grad:  3.0 mmHg    SHUNTS MV Vmax:       1.29 m/s    Systemic VTI:  0.13 m MV Vmean:      83.0 cm/s   Systemic Diam: 2.00 cm MV Decel Time: 160 msec    Pulmonic VTI:  0.174 m MV E velocity: 86.50 cm/s Yolonda Kida MD Electronically signed by Yolonda Kida MD Signature Date/Time: 08/30/2021/4:16:29 PM    Final      Echo preserved left ventricular function 55%  TELEMETRY: Atrial fibrillation rapid ventricular response rate of around 1 10-1 20:  ASSESSMENT AND PLAN:  Principal Problem:   NSTEMI (non-ST elevated myocardial infarction) ([place]) Active Problems:   GERD (gastroesophageal reflux disease)   Acute respiratory failure with hypoxia ([place])   Community acquired pneumonia   Atrial fibrillation with RVR (HCC)   Diarrhea   Depression   Hypokalemia   Acute systolic CHF (congestive heart failure) ([facility])   Malnutrition of moderate degree    Plan Continue telemetry for atrial fibrillation regulation and management Please activity ambulate in halls  Recommend physical therapy occupational therapy Inhalers as necessary for mild COPD Agree with plavix/asa B-blocker Rec Amiodarone 200 mg BID x 2 weeks then 200 mg daily Cont Metoprolol 100 mg BID Add low-dose Cardizem to help with additional rate control if blood pressure allows S/P NSTEMI s/p cath/PCI stent RCAx2 with DES Anticoug triple therapy asa 81 mg x 4 weeks, plavix75 mg daily , Eliquis 2.5 mg bid Consider cardiac rehab Anticipate discharge in the next 48 to 72 hours   Yolonda Kida, MD 08/31/2021 7:44 AM

## 2021-08-31 NOTE — Assessment & Plan Note (Signed)
Cardiac cath on 12/2 revealed reduced LV EF of 35-40%.   2D echo 12/1 showed EF 55-60% with regional [facility] --Cardiology managing --On IV diuresis --Continue metoprolol --Consider addition of low dose Cardizem if needed for HR control as BP allows --Strict I/O's and daily weights --Monitor renal function and electrolytes

## 2021-08-31 NOTE — Assessment & Plan Note (Signed)
Not POA, likely due to diuresis. K 3.1 on AM labs 12/1 - replaced. Monitor and replace to keep K>4. Also monitor and replace Mg.

## 2021-08-31 NOTE — Assessment & Plan Note (Signed)
As evidenced by mild-moderate fat and muscle depletion.  Related to CHF. Dietitian following. Ensure supplement drinks, multivitamin

## 2021-08-31 NOTE — Assessment & Plan Note (Signed)
POA with troponin peaking at 1554, then trended down.  Pt had acute onset dyspnea without chest pain.  Unclear if ACS or demand ischemia with A-fib RVR.   Placed on heparin drip on admission pending cath. Underwent cardiac cath on 08/30/21 - 2 stents placed, see cath report.  --Mgmt per cardiology --Low dose Eliquis per cardiology --ASA and Plavix --Continue statin --IV Diuresis  --Monitor and replace electrolytes --Telemetry

## 2021-08-31 NOTE — Assessment & Plan Note (Signed)
Continue home Lexapro. 

## 2021-08-31 NOTE — Assessment & Plan Note (Signed)
Present on admission - pt presented with dyspnea, tachypnea.  Requiring 2 L/min supplemental O2 to maintain spO2 > 90%. Due to A-fib RVR, NSTEMI and likely acute on chronic CHF, possible respiratory infection as well with very productive cough. CTA chest negative for PE, showed chronic changes and sequelae of prior infection in R upper/middle lobes with bronchiectasis. 12/2 - weaned to room air --Supplement O2 to keep sats > 90% --Wean O2 as tolerated --Mgmt of underlying conditions as outlined

## 2021-09-01 DIAGNOSIS — E876 Hypokalemia: Secondary | ICD-10-CM

## 2021-09-01 DIAGNOSIS — J189 Pneumonia, unspecified organism: Secondary | ICD-10-CM

## 2021-09-01 DIAGNOSIS — I509 Heart failure, unspecified: Secondary | ICD-10-CM

## 2021-09-01 DIAGNOSIS — I214 Non-ST elevation (NSTEMI) myocardial infarction: Secondary | ICD-10-CM | POA: Diagnosis not present

## 2021-09-01 DIAGNOSIS — J9601 Acute respiratory failure with hypoxia: Secondary | ICD-10-CM | POA: Diagnosis not present

## 2021-09-01 DIAGNOSIS — F32A Depression, unspecified: Secondary | ICD-10-CM

## 2021-09-01 DIAGNOSIS — R197 Diarrhea, unspecified: Secondary | ICD-10-CM

## 2021-09-01 DIAGNOSIS — I4891 Unspecified atrial fibrillation: Secondary | ICD-10-CM | POA: Diagnosis not present

## 2021-09-01 LAB — CBC
HCT: 34.3 % — ABNORMAL LOW (ref 36.0–46.0)
Hemoglobin: 11.1 g/dL — ABNORMAL LOW (ref 12.0–15.0)
MCH: 30.4 pg (ref 26.0–34.0)
MCHC: 32.4 g/dL (ref 30.0–36.0)
MCV: 94 fL (ref 80.0–100.0)
Platelets: 246 10*3/uL (ref 150–400)
RBC: 3.65 MIL/uL — ABNORMAL LOW (ref 3.87–5.11)
RDW: 14.1 % (ref 11.5–15.5)
WBC: 8 10*3/uL (ref 4.0–10.5)
nRBC: 0 % (ref 0.0–0.2)

## 2021-09-01 LAB — BASIC METABOLIC PANEL
Anion gap: 8 (ref 5–15)
BUN: 19 mg/dL (ref 8–23)
CO2: 24 mmol/L (ref 22–32)
Calcium: 8.8 mg/dL — ABNORMAL LOW (ref 8.9–10.3)
Chloride: 105 mmol/L (ref 98–111)
Creatinine, Ser: 0.8 mg/dL (ref 0.44–1.00)
GFR, Estimated: >60 mL/min (ref >60)
Glucose, Bld: 118 mg/dL — ABNORMAL HIGH (ref 70–99)
Potassium: 4.2 mmol/L (ref 3.5–5.1)
Sodium: 137 mmol/L (ref 135–145)

## 2021-09-01 LAB — MAGNESIUM: Magnesium: 1.7 mg/dL (ref 1.7–2.4)

## 2021-09-01 LAB — EXPECTORATED SPUTUM ASSESSMENT W GRAM STAIN, RFLX TO RESP C

## 2021-09-01 MED ORDER — POTASSIUM CHLORIDE CRYS ER 20 MEQ PO TBCR
20.0000 meq | EXTENDED_RELEASE_TABLET | Freq: Two times a day (BID) | ORAL | Status: DC
  Administered 2021-09-01 – 2021-09-06 (×11): 20 meq via ORAL
  Filled 2021-09-01 (×4): qty 1
  Filled 2021-09-01: qty 2
  Filled 2021-09-01: qty 1
  Filled 2021-09-01: qty 2
  Filled 2021-09-01 (×3): qty 1

## 2021-09-01 MED ORDER — LEVALBUTEROL HCL 0.63 MG/3ML IN NEBU
0.6300 mg | INHALATION_SOLUTION | Freq: Three times a day (TID) | RESPIRATORY_TRACT | Status: DC
  Administered 2021-09-01 – 2021-09-06 (×15): 0.63 mg via RESPIRATORY_TRACT
  Filled 2021-09-01 (×15): qty 3

## 2021-09-01 MED ORDER — IPRATROPIUM BROMIDE 0.02 % IN SOLN
0.5000 mg | Freq: Three times a day (TID) | RESPIRATORY_TRACT | Status: DC
  Administered 2021-09-01 – 2021-09-06 (×16): 0.5 mg via RESPIRATORY_TRACT
  Filled 2021-09-01 (×16): qty 2.5

## 2021-09-01 MED ORDER — LOPERAMIDE HCL 2 MG PO CAPS: 2.0000 mg | ORAL_CAPSULE | ORAL | Status: DC | PRN

## 2021-09-01 MED ORDER — MAGNESIUM SULFATE 2 GM/50ML IV SOLN
2.0000 g | Freq: Once | INTRAVENOUS | Status: AC
  Administered 2021-09-01: 10:00:00 2 g via INTRAVENOUS
  Filled 2021-09-01: qty 50

## 2021-09-01 MED ORDER — AMIODARONE HCL 200 MG PO TABS
200.0000 mg | ORAL_TABLET | Freq: Two times a day (BID) | ORAL | Status: DC
  Administered 2021-09-01 – 2021-09-04 (×7): 200 mg via ORAL
  Filled 2021-09-01 (×7): qty 1

## 2021-09-01 NOTE — Progress Notes (Signed)
PROGRESS NOTE    [name]  OZD:[id number] DOB: [birth year] DOA: 08/28/2021 PCP: Rusty Aus, MD    Chief Complaint  Patient presents with   Shortness of Breath    Brief Narrative:  From H&P: "[name] is a 87 y.o. female with medical history significant for GERD, hypertension, osteoarthritis, non-STEMI, chronic diastolic CHF, and atrial fibrillation on Eliquis, coming with acute onset cough with associated dyspnea without chest pain or palpitations.  She denies any orthopnea or proximal nocturnal dyspnea.  She has chronic significant lower extremity edema.  No fever or chills."   She reported two weeks of diarrhea mostly after meals, but no nausea, vomiting or abdominal pain.  She had been prescribed Omnicef by PCP for presumed respiratory infection due to cough and feeling generally ill, had taken it for 3 days and initially improved prior to worsening again.  Remains with very coarse productive cough.   Admitted for further evaluation and management of NSTEMI, A-fib RVR, apparent decompensated CHF, possible PNA vs bronchitis.  Cardiology consulted.  Patient subsequently underwent cardiac cath with PCI stent placement.     Assessment & Plan:   Principal Problem:   NSTEMI (non-ST elevated myocardial infarction) ([place]) Active Problems:   GERD (gastroesophageal reflux disease)   Acute respiratory failure with hypoxia ([place])   Community acquired pneumonia   Atrial fibrillation with RVR (HCC)   Diarrhea   Depression   Hypokalemia   Acute systolic CHF (congestive heart failure) (HCC)   Malnutrition of moderate degree  #1 non-STEMI -Patient presented with acute onset dyspnea without any chest pain.  Patient also noted to go into A. fib. -Troponins elevated and peaked at 1554. -Patient initially placed on heparin drip pending cardiac catheterization. -Patient seen in consultation by cardiology underwent cardiac cath 08/30/2021 with PCI stent to RCA x2 with DES. -Continue aspirin  81 mg daily x4 weeks, Plavix, Eliquis,  beta-blocker. -Continue statin. -On IV Lasix. -Per cardiology.  2.  Acute systolic heart failure -Likely secondary to non-STEMI. -2D echo done with EF of 55 to 60% with WMA. -Patient seen in consultation by cardiology underwent cardiac catheterization on 08/30/2021 with a EF of 35 to 40% status post PCI stent to RCA x2 with DES. -Patient with urine output of 1.5 L over the past 24 hours. -Patient is -2.135 L during this hospitalization. -Continue aspirin, Eliquis, Lipitor, Plavix, Lopressor. -Per cardiology.  3.  A. fib with RVR, POA -Patient noted to have presented with A. fib with RVR heart rates in the 130s to 140s. -Likely secondary to non-STEMI and acute CHF exacerbation. -Blood pressure soft/borderline. -Continue Lopressor. -We will hold off on Cardizem due to borderline blood pressure and decreased EF. -Cardiology recommended amiodarone 200 mg twice daily x2 weeks and then 200 mg daily. -We will order amiodarone today and monitor blood pressure closely. -Continue Eliquis for anticoagulation. -Per cardiology.  4.  Hypokalemia -Likely secondary to diuresis. -Replete.  5.  Acute respiratory failure with hypoxia secondary to non-STEMI and acute systolic heart failure and A. fib with RVR, POA -Patient noted to have presented with dyspnea, tachypnea, new O2 requirements requiring 2 L nasal cannula. -Patient noted on presentation to be in A. fib with RVR, non-STEMI, acute CHF exacerbation likely etiology of acute respiratory failure with hypoxia. -CT angiogram chest negative for PE however showed chronic changes and sequelae of prior infection in the right upper and middle lobes with bronchiectasis. -Continue treatment for A. fib, non-STEMI, CHF. -COVID-19 PCR negative, influenza A and B negative. -  Continue Mucinex, Claritin, PPI, place on neb treatment. -Wean O2.  6.,  CAP, POA -Patient noted to have had a productive cough with thick  yellowish sputum noted to have been treated with Omnicef x3 days per PCP initially with some improvement and then recurrence. -Continue empiric IV antibiotics of Rocephin and azithromycin. -Continue Mucinex, Claritin, PPI. -Neb treatment.  7.  Diarrhea -C. difficile PCR negative. -GI pathogen panel negative. -Placed on Imodium as needed.  8.  GERD -PPI.  9.  Depression -Lexapro. -    DVT prophylaxis: Eliquis Code Status: DNR Family Communication: Updated patient.  No family at bedside. Disposition:   Status is: Inpatient  Remains inpatient appropriate because: Severity of illness       Consultants:  Cardiology: [facility] 08/29/2021  Procedures:  CT angiogram chest 08/28/2021 Chest x-ray 08/28/2021 2D echo 08/29/2021 Cardiac catheterization with a EF 35 to 40%, status post PCI stent to mid RCA and PCI stent to mid RCA per Dr. Clayborn Bigness 08/30/2021  Antimicrobials:  IV azithromycin 08/29/2021>>>>> 09/03/2021 IV Rocephin 08/29/2021>>>> 09/01/2021    Subjective: Sitting up in chair.  States some improvement with chest tightness and chest pain.  No bleeding.  Overall slowly feeling better.  With a productive cough of yellowish sputum.  Objective: Vitals:   09/01/21 0446 09/01/21 0731 09/01/21 0748 09/01/21 1133  BP:  (!) 123/95  107/69  Pulse:  (!) 107 (!) 106 100  Resp:  17 16 17   Temp:  98.5 F (36.9 C)  97.8 F (36.6 C)  TempSrc:  Oral  Oral  SpO2:  98% 97% 97%  Weight: 71.5 kg     Height:        Intake/Output Summary (Last 24 hours) at 09/01/2021 1316 Last data filed at 09/01/2021 1000 Gross per 24 hour  Intake 1339.94 ml  Output 501 ml  Net 838.94 ml   Filed Weights   08/30/21 0525 08/31/21 0436 09/01/21 0446  Weight: 75.2 kg 74.8 kg 71.5 kg    Examination:  General exam: Appears calm and comfortable  Respiratory system: Some scattered coarse breath sounds.  No wheezing noted.  Fair air movement.   Cardiovascular system: Irregularly irregular.   No JVD, murmurs, rubs, gallops or clicks. No pedal edema. Gastrointestinal system: Abdomen is nondistended, soft and nontender. No organomegaly or masses felt. Normal bowel sounds heard. Central nervous system: Alert and oriented. No focal neurological deficits. Extremities: Symmetric 5 x 5 power. Skin: No rashes, lesions or ulcers Psychiatry: Judgement and insight appear normal. Mood & affect appropriate.     Data Reviewed: I have personally reviewed following labs and imaging studies  CBC: Recent Labs  Lab 08/28/21 2022 08/29/21 0606 08/30/21 0503 08/31/21 0555 09/01/21 0611  WBC 8.0 6.6 6.2 7.6 8.0  NEUTROABS 5.5  --   --   --   --   HGB 13.4 12.4 12.6 11.3* 11.1*  HCT 41.5 38.2 39.5 34.3* 34.3*  MCV 95.0 94.1 95.2 94.8 94.0  PLT 210 185 199 235 127    Basic Metabolic Panel: Recent Labs  Lab 08/28/21 2022 08/29/21 0606 08/30/21 0503 08/31/21 0555 09/01/21 0611  NA 132* 135 138 135 137  K 3.8 3.1* 3.9 3.8 4.2  CL 101 106 105 103 105  CO2 24 22 26 25 24   GLUCOSE 115* 89 90 109* 118*  BUN 31* 23 22 19 19   CREATININE 0.88 0.79 1.00 0.84 0.80  CALCIUM 9.1 8.3* 8.7* 8.5* 8.8*  MG  --   --  1.7 1.8 1.7  GFR: Estimated Creatinine Clearance: 45 mL/min (by C-G formula based on SCr of 0.8 mg/dL).  Liver Function Tests: Recent Labs  Lab 08/28/21 2022 08/30/21 0503  AST 40 26  ALT 27 20  ALKPHOS 60 58  BILITOT 0.7 0.5  PROT 6.7 5.7*  ALBUMIN 3.6 2.7*    CBG: No results for input(s): GLUCAP in the last 168 hours.   Recent Results (from the past 240 hour(s))  Resp Panel by RT-PCR (Flu A&B, Covid) Nasopharyngeal Swab     Status: None   Collection Time: 08/28/21  8:22 PM   Specimen: Nasopharyngeal Swab; Nasopharyngeal(NP) swabs in vial transport medium  Result Value Ref Range Status   SARS Coronavirus 2 by RT PCR NEGATIVE NEGATIVE Final    Comment: (NOTE) SARS-CoV-2 target nucleic acids are NOT DETECTED.  The SARS-CoV-2 RNA is generally detectable in  upper respiratory specimens during the acute phase of infection. The lowest concentration of SARS-CoV-2 viral copies this assay can detect is 138 copies/mL. A negative result does not preclude SARS-Cov-2 infection and should not be used as the sole basis for treatment or other patient management decisions. A negative result may occur with  improper specimen collection/handling, submission of specimen other than nasopharyngeal swab, presence of viral mutation(s) within the areas targeted by this assay, and inadequate number of viral copies(<138 copies/mL). A negative result must be combined with clinical observations, patient history, and epidemiological information. The expected result is Negative.  Fact Sheet for Patients:  EntrepreneurPulse.com.au  Fact Sheet for Healthcare Providers:  IncredibleEmployment.be  This test is no t yet approved or cleared by the [place] FDA and  has been authorized for detection and/or diagnosis of SARS-CoV-2 by FDA under an Emergency Use Authorization (EUA). This EUA will remain  in effect (meaning this test can be used) for the duration of the COVID-19 declaration under Section 564(b)(1) of the Act, 21 U.S.C.section 360bbb-3(b)(1), unless the authorization is terminated  or revoked sooner.       Influenza A by PCR NEGATIVE NEGATIVE Final   Influenza B by PCR NEGATIVE NEGATIVE Final    Comment: (NOTE) The Xpert Xpress SARS-CoV-2/FLU/RSV plus assay is intended as an aid in the diagnosis of influenza from Nasopharyngeal swab specimens and should not be used as a sole basis for treatment. Nasal washings and aspirates are unacceptable for Xpert Xpress SARS-CoV-2/FLU/RSV testing.  Fact Sheet for Patients: EntrepreneurPulse.com.au  Fact Sheet for Healthcare Providers: IncredibleEmployment.be  This test is not yet approved or cleared by the [place] FDA and has been  authorized for detection and/or diagnosis of SARS-CoV-2 by FDA under an Emergency Use Authorization (EUA). This EUA will remain in effect (meaning this test can be used) for the duration of the COVID-19 declaration under Section 564(b)(1) of the Act, 21 U.S.C. section 360bbb-3(b)(1), unless the authorization is terminated or revoked.  Performed at [facility], [place], [place] [address]   C Difficile Quick Screen w PCR reflex     Status: None   Collection Time: 08/29/21 11:34 PM   Specimen: STOOL  Result Value Ref Range Status   C Diff antigen NEGATIVE NEGATIVE Final   C Diff toxin NEGATIVE NEGATIVE Final   C Diff interpretation No C. difficile detected.  Final    Comment: Performed at [facility], [address] [place], [place] [address]  Gastrointestinal Panel by PCR , Stool     Status: None   Collection Time: 08/29/21 11:34 PM   Specimen: Stool  Result  Value Ref Range Status   Campylobacter species NOT DETECTED NOT DETECTED Final   Plesimonas shigelloides NOT DETECTED NOT DETECTED Final   Salmonella species NOT DETECTED NOT DETECTED Final   Yersinia enterocolitica NOT DETECTED NOT DETECTED Final   Vibrio species NOT DETECTED NOT DETECTED Final   Vibrio cholerae NOT DETECTED NOT DETECTED Final   Enteroaggregative E coli (EAEC) NOT DETECTED NOT DETECTED Final   Enteropathogenic E coli (EPEC) NOT DETECTED NOT DETECTED Final   Enterotoxigenic E coli (ETEC) NOT DETECTED NOT DETECTED Final   Shiga like toxin producing E coli (STEC) NOT DETECTED NOT DETECTED Final   Shigella/Enteroinvasive E coli (EIEC) NOT DETECTED NOT DETECTED Final   Cryptosporidium NOT DETECTED NOT DETECTED Final   Cyclospora cayetanensis NOT DETECTED NOT DETECTED Final   Entamoeba histolytica NOT DETECTED NOT DETECTED Final   Giardia lamblia NOT DETECTED NOT DETECTED Final   Adenovirus F40/41 NOT DETECTED NOT DETECTED Final   Astrovirus NOT DETECTED NOT DETECTED Final    Norovirus GI/GII NOT DETECTED NOT DETECTED Final   Rotavirus A NOT DETECTED NOT DETECTED Final   Sapovirus (I, II, IV, and V) NOT DETECTED NOT DETECTED Final    Comment: Performed at [facility], [address] [place], [place] [address]  Expectorated Sputum Assessment w Gram Stain, Rflx to Resp Cult     Status: None   Collection Time: 08/31/21  3:30 PM   Specimen: Sputum  Result Value Ref Range Status   Specimen Description SPUTUM  Final   Special Requests NONE  Final   Sputum evaluation   Final    THIS SPECIMEN IS ACCEPTABLE FOR SPUTUM CULTURE Performed at [facility], [address] [place], [place] [address]    Report Status 09/01/2021 FINAL  Final  Culture, Respiratory w Gram Stain     Status: None (Preliminary result)   Collection Time: 08/31/21  3:30 PM   Specimen: SPU  Result Value Ref Range Status   Specimen Description   Final    SPUTUM Performed at [facility], [address], [place], [place] [address]    Special Requests   Final    NONE Reflexed from [id number] Performed at [facility], [address] [place], [place] [address]    Gram Stain   Final    NO SQUAMOUS EPITHELIAL CELLS SEEN FEW WBC SEEN FEW GRAM POSITIVE RODS MODERATE GRAM POSITIVE COCCI Performed at [facility], [place] [address] [place], [place] [address]    Culture PENDING  Incomplete   Report Status PENDING  Incomplete         Radiology Studies: No results found.      Scheduled Meds:  amiodarone  200 mg Oral BID   apixaban  2.5 mg Oral BID   aspirin  81 mg Oral Daily   atorvastatin  80 mg Oral Daily   calcium-vitamin D  2 tablet Oral Q breakfast   clopidogrel  75 mg Oral Q breakfast   dextromethorphan-guaiFENesin  1 tablet Oral BID   escitalopram  5 mg Oral Daily   feeding supplement  237 mL Oral TID BM   furosemide  20 mg Intravenous Q12H   ipratropium  0.5 mg Nebulization TID   levalbuterol  0.63 mg Nebulization TID    loratadine  10 mg Oral Daily   metoprolol tartrate  100 mg Oral BID   multivitamin with minerals  1 tablet Oral Daily   pantoprazole  40 mg Oral Daily   potassium chloride SA  20 mEq Oral BID   sodium chloride flush  3 mL Intravenous Q12H   sodium chloride flush  3 mL Intravenous Q12H   Continuous Infusions:  sodium chloride     azithromycin 500 mg (08/31/21 1133)   cefTRIAXone (ROCEPHIN)  IV 1 g (09/01/21 1314)     LOS: 4 days    Time spent: 40 minutes    Irine Seal, MD [facility]   To contact the attending provider between 7A-7P or the covering provider during after hours 7P-7A, please log into the web site [email or web address] and access using universal [place] password for that web site. If you do not have the password, please call the hospital operator.  09/01/2021, 1:16 PM

## 2021-09-01 NOTE — Progress Notes (Signed)
[facility]    SUBJECTIVE: Patient states he feels much better still has some mild weakness denies palpitations no chest pain lives alone and would like to feel comfortable with enough strength to return to home to care for self   Vitals:   09/01/21 0439 09/01/21 0446 09/01/21 0731 09/01/21 0748  BP: 119/80  (!) 123/95   Pulse: 83  (!) 107 (!) 106  Resp: (!) 22  17 16   Temp: 97.7 F (36.5 C)  98.5 F (36.9 C)   TempSrc:   Oral   SpO2: 99%  98% 97%  Weight:  71.5 kg    Height:         Intake/Output Summary (Last 24 hours) at 09/01/2021 0920 Last data filed at 08/31/2021 2242 Gross per 24 hour  Intake 982.94 ml  Output 501 ml  Net 481.94 ml      PHYSICAL EXAM  General: Well developed, well nourished, in no acute distress HEENT:  Normocephalic and atramatic Neck:  No JVD.  Lungs: Clear bilaterally to auscultation and percussion. Heart: Irregular. Normal S1 and S2 without gallops or murmurs.  Abdomen: Bowel sounds are positive, abdomen soft and non-tender  Msk:  Back normal, normal gait. Normal strength and tone for age. Extremities: No clubbing, cyanosis or edema.   Neuro: Alert and oriented X 3. Psych:  Good affect, responds appropriately   LABS: Basic Metabolic Panel: Recent Labs    08/31/21 0555 09/01/21 0611  NA 135 137  K 3.8 4.2  CL 103 105  CO2 25 24  GLUCOSE 109* 118*  BUN 19 19  CREATININE 0.84 0.80  CALCIUM 8.5* 8.8*  MG 1.8 1.7   Liver Function Tests: Recent Labs    08/30/21 0503  AST 26  ALT 20  ALKPHOS 58  BILITOT 0.5  PROT 5.7*  ALBUMIN 2.7*   No results for input(s): LIPASE, AMYLASE in the last 72 hours. CBC: Recent Labs    08/31/21 0555 09/01/21 0611  WBC 7.6 8.0  HGB 11.3* 11.1*  HCT 34.3* 34.3*  MCV 94.8 94.0  PLT 235 246   Cardiac Enzymes: No results for input(s): CKTOTAL, CKMB, CKMBINDEX, TROPONINI in the last 72 hours. BNP: Invalid input(s): POCBNP D-Dimer: No results for input(s): DDIMER in the last 72  hours. Hemoglobin A1C: No results for input(s): HGBA1C in the last 72 hours. Fasting Lipid Panel: No results for input(s): CHOL, HDL, LDLCALC, TRIG, CHOLHDL, LDLDIRECT in the last 72 hours. Thyroid Function Tests: No results for input(s): TSH, T4TOTAL, T3FREE, THYROIDAB in the last 72 hours.  Invalid input(s): FREET3 Anemia Panel: No results for input(s): VITAMINB12, FOLATE, FERRITIN, TIBC, IRON, RETICCTPCT in the last 72 hours.  CARDIAC CATHETERIZATION  Result Date: 08/30/2021   Mid LM to Dist LM lesion is 35% stenosed.   Prox LAD lesion is 50% stenosed.   Prox RCA lesion is 95% stenosed.   Mid RCA lesion is 75% stenosed.   A stent was successfully placed.   A stent was successfully placed.   Post intervention, there is a 0% residual stenosis.   Post intervention, there is a 0% residual stenosis.   There is mild to moderate left ventricular systolic dysfunction.   LV end diastolic pressure is mildly elevated.   The left ventricular ejection fraction is 35-45% by visual estimate.   There is no mitral valve regurgitation. Conclusion Diagnostic cardiac cath because of non-STEMI and heart failure Left ventriculogram shows anterior  hypokinesis Ejection fraction of around 35 to 40% ejection fraction of  around 35-40 Coronaries Left main distal 25% LAD large mid 50% diagonal 1 bifurcation Circumflex relatively free of disease RCA large proximal 95 mid 75% IRA vessel TIMI-3 flow in all vessels pre and postintervention Intervention Successful PCI and stent of mid RCA 2.25 mmx16mm Onyx  frontier Lesion reduced from 75 to 0% Successful PCI stent of proximal RCA lesion 2.25 x 15 mm Onyx frontier Reduce lesion from 95 down to 0% Placed on on Angiomax Plavix aspirin Minx deployed for hemostasis     Echo mildly depressed left ventricular function around 35 to 40%  TELEMETRY: Atrial fibrillation range from 80-1 10:  ASSESSMENT AND PLAN:  Principal Problem:   NSTEMI (non-ST elevated myocardial infarction)  ([place]) Active Problems:   GERD (gastroesophageal reflux disease)   Acute respiratory failure with hypoxia ([place])   Community acquired pneumonia   Atrial fibrillation with RVR (H[facility])   Diarrhea   Depression   Hypokalemia   Acute systolic CHF (congestive heart failure) (H[facility])   Malnutrition of moderate degree    Plan Status post non-STEMI patient doing well Continue telemetry Status post PCI and stent with DES continue aspirin Plavix Maintain beta-blocker ARB statin therapy Atrial fibrillation severe rate control with beta-blocker Continue Eliquis therapy for anticoagulation Will discontinue aspirin after 4 weeks and maintain dual anticoagulation with Plavix and Eliquis for about 6 to 12 months Sickle therapy to help with strength training balance and ambulation Patient should be okay for discharge from a cardiology standpoint   Yolonda Kida, MD 09/01/2021 9:20 AM

## 2021-09-01 NOTE — Evaluation (Signed)
Occupational Therapy Evaluation Patient Details Name: [name] MRN: [id number] DOB: 1927/04/19 Today's Date: 09/01/2021   History of Present Illness 87 y.o. female with medical history significant for GERD, hypertension, osteoarthritis, non-STEMI, chronic diastolic CHF, and atrial fibrillation on Eliquis, coming with acute onset cough with associated dyspnea without chest pain or palpitations. Pt admitted for management of NSTEMI (troponin peaked 1554), Afib with RVR.  S/P cardiac cath with PCI to RCA (x2) via R femoral artery.   Clinical Impression   Pt seen for OT evaluation this date. Prior to admission, pt was independent with ADLs (typically sponge-bathing) and functional mobility of household distances, and was living alone in a 1-story home. Pt currently requires SUPERVISION/SET-UP for seated grooming tasks, MIN-MOD A for functional transfers, MIN GUARD for functional mobility of short household distances (~50ft) with RW, and MIN GUARD for seated peri-care due to current functional impairments (See OT Problem List below). Pt would benefit from additional skilled OT services to maximize return to PLOF and minimize risk of future falls, injury, caregiver burden, and readmission. Upon discharge, recommend SNF (potential to discharge with HHOT if family able to provide significant physical assist with functional transfers).      Recommendations for follow up therapy are one component of a multi-disciplinary discharge planning process, led by the attending physician.  Recommendations may be updated based on patient status, additional functional criteria and insurance authorization.   Follow Up Recommendations  Skilled nursing-short term rehab (<3 hours/day) (may progress to discharge with HHOT if family able to provide physical assist with functional transfers)    Assistance Recommended at Discharge Frequent or constant Supervision/Assistance  Functional Status Assessment  Patient has had a  recent decline in their functional status and demonstrates the ability to make significant improvements in function in a reasonable and predictable amount of time.  Equipment Recommendations  None recommended by OT (pt has all necessary DME)       Precautions / Restrictions Precautions Precautions: Fall Restrictions Weight Bearing Restrictions: No      Mobility Bed Mobility      General bed mobility comments: not assessed, pt in recliner at beginning/end of session    Transfers Overall transfer level: Needs assistance Equipment used: Rolling walker (2 wheels) Transfers: Sit to/from Stand Sit to Stand: Mod assist;Min assist Stand pivot transfers: Min assist;Mod assist         General transfer comment: Requires MOD A from recliner and MIN A from [facility]      Balance Overall balance assessment: Needs assistance Sitting-balance support: No upper extremity supported;Feet supported Sitting balance-Leahy Scale: Good Sitting balance - Comments: Good sitting balance during toilet hygiene   Standing balance support: Bilateral upper extremity supported;During functional activity;Reliant on assistive device for balance Standing balance-Leahy Scale: Fair Standing balance comment: Requires MIN GUARD to walk 20 ft                           ADL either performed or assessed with clinical judgement   ADL Overall ADL's : Needs assistance/impaired                     Lower Body Dressing: Min guard;Sitting/lateral leans Lower Body Dressing Details (indicate cue type and reason): to don socks Toilet Transfer: Min guard;Ambulation;BSC/3in1;Rolling walker (2 wheels)   Toileting- Clothing Manipulation and Hygiene: Min guard;Sitting/lateral lean       Functional mobility during ADLs: Min guard;Rolling walker (2 wheels) (to walk 33ft)  Vision Baseline Vision/History: 1 Wears glasses (reading glasses) Ability to See in Adequate Light: 0 Adequate Patient Visual  Report: No change from baseline              Pertinent Vitals/Pain Pain Assessment: Faces Faces Pain Scale: Hurts little more Pain Location: R hip/groin Pain Descriptors / Indicators: Aching;Grimacing;Guarding Pain Intervention(s): Limited activity within patient's tolerance;Monitored during session;Repositioned        Extremity/Trunk Assessment Upper Extremity Assessment Upper Extremity Assessment: Overall WFL for tasks assessed (grossly 4-/5 throughout)   Lower Extremity Assessment Lower Extremity Assessment: Generalized weakness       Communication Communication Communication: No difficulties   Cognition Arousal/Alertness: Awake/alert Behavior During Therapy: WFL for tasks assessed/performed Overall Cognitive Status: Within Functional Limits for tasks assessed                                 General Comments: A&Ox4. Pleasant and agreeable throughout                [place] expects to be discharged to:: Private residence Living Arrangements: Alone Available Help at Discharge: Family;Available 24 hours/day Type of Home: House Home Access: Stairs to enter [employer] of Steps: 1 Entrance Stairs-Rails: None Home Layout: One level     Bathroom Shower/Tub: Tub/shower unit;Sponge bathes at baseline (only uses tub 2x/month)         Home Equipment: Cane - single point;BSC/3in1          Prior Functioning/Environment Prior Level of Function : Independent/Modified Independent             Mobility Comments: Mod indep with household and community mobilization; intermittent use of SPC for longer, community distances.  Denies fall history; no home O2. ADLs Comments: Indep with ADLs        OT Problem List: Decreased strength;Decreased activity tolerance;Impaired balance (sitting and/or standing);Cardiopulmonary status limiting activity      OT Treatment/Interventions: Self-care/ADL training;Therapeutic  exercise;Energy conservation;DME and/or AE instruction;Therapeutic activities;Patient/family education;Balance training    OT Goals(Current goals can be found in the care plan section) Acute Rehab OT Goals Patient Stated Goal: to have more energy OT Goal Formulation: With patient Time For Goal Achievement: 09/15/21 Potential to Achieve Goals: Good ADL Goals Pt Will Perform Grooming: with supervision;standing (performing at least 2 standing grooming tasks) Pt Will Perform Lower Body Bathing: with set-up;with supervision;sitting/lateral leans Pt Will Transfer to Toilet: with min guard assist;ambulating;bedside commode  OT Frequency: Min 3X/week    AM-PAC OT "6 Clicks" Daily Activity     Outcome Measure Help from another person eating meals?: None Help from another person taking care of personal grooming?: A Little Help from another person toileting, which includes using toliet, bedpan, or urinal?: A Little Help from another person bathing (including washing, rinsing, drying)?: A Lot Help from another person to put on and taking off regular upper body clothing?: A Little Help from another person to put on and taking off regular lower body clothing?: A Little 6 Click Score: 18   End of Session Equipment Utilized During Treatment: Rolling walker (2 wheels) Nurse Communication: Mobility status  Activity Tolerance: Patient tolerated treatment well Patient left: in chair;with call bell/phone within reach;with chair alarm set;with family/visitor present  OT Visit Diagnosis: Muscle weakness (generalized) (M62.81)                Time: 9924-2683 OT Time Calculation (min): 35 min Charges:  OT General Charges $  OT Visit: 1 Visit OT Evaluation $OT Eval Moderate Complexity: 1 Mod OT Treatments $Self Care/Home Management : 23-37 mins  Fredirick Maudlin, OTR/L [place]

## 2021-09-01 NOTE — Evaluation (Signed)
Physical Therapy Evaluation Patient Details Name: [name] MRN: [id number] DOB: 01/11/1927 Today's Date: 09/01/2021  History of Present Illness  presented to ER secondary to progressive SOB; admitted for management of NSTEMI (troponin peaked 1554), Afib with RVR.  S/P cardiac cath with PCI to RCA (x2) via R femoral artery.  Clinical Impression  Patient resting in bed upon arrival to session; alert and oriented, follows commands and agreeable to participation with session.  Endorses generalized pain throughout R hip/groin area; moderate edema and ecchymosis noted to area.  Generally weak and deconditioned throughout all extremities; however, no focal weakness appreciated.  Able to complete bed mobility with min/mod assist; sit/stand, basic transfers with RW, min/mod assist.  Moderate assist for initial sit/stand; maintains standing balance with RW, min assist once upright.  Rates fatigue level 5/10 after basic mobility; declined additional gait efforts as result.  Will continue to assess/progress in subsequent sessions as appropriate. Of note, sats >93-94% at rest and with exertion throughout session.  Left on RA end of session; RN informed/aware. Would benefit from skilled PT to address above deficits and promote optimal return to PLOF.; recommend transition to STR upon discharge from acute hospitalization, as patient lacks ability to safely and consistently manage self-care, household mobilization at this time.        Recommendations for follow up therapy are one component of a multi-disciplinary discharge planning process, led by the attending physician.  Recommendations may be updated based on patient status, additional functional criteria and insurance authorization.  Follow Up Recommendations Skilled nursing-short term rehab (<3 hours/day)    Assistance Recommended at Discharge Frequent or constant Supervision/Assistance  Functional Status Assessment Patient has had a recent decline in  their functional status and demonstrates the ability to make significant improvements in function in a reasonable and predictable amount of time.  Equipment Recommendations  Rolling walker (2 wheels);BSC/3in1    Recommendations for Other Services       Precautions / Restrictions Precautions Precautions: Fall Restrictions Weight Bearing Restrictions: No      Mobility  Bed Mobility Overal bed mobility: Needs Assistance Bed Mobility: Supine to Sit     Supine to sit: Min assist;Mod assist          Transfers Overall transfer level: Needs assistance Equipment used: Rolling walker (2 wheels) Transfers: Sit to/from Stand;Bed to chair/wheelchair/BSC Sit to Stand: Mod assist Stand pivot transfers: Min assist;Mod assist         General transfer comment: cuing for hand placement; assist for lift off. Maintains standing position and static balacne with min assist once upright and accommodated to position    Ambulation/Gait               General Gait Details: deferred due to generalized fatigue  Stairs            Wheelchair Mobility    Modified Rankin (Stroke Patients Only)       Balance Overall balance assessment: Needs assistance Sitting-balance support: No upper extremity supported;Feet supported Sitting balance-Leahy Scale: Good     Standing balance support: Bilateral upper extremity supported Standing balance-Leahy Scale: Fair                               Pertinent Vitals/Pain Pain Assessment: Faces Faces Pain Scale: Hurts little more Pain Location: R hip/groin Pain Descriptors / Indicators: Aching;Grimacing;Guarding Pain Intervention(s): Limited activity within patient's tolerance;Monitored during session;Repositioned    Home Living Family/patient expects to be  discharged to:: Private residence Living Arrangements: Alone Available Help at Discharge: Family;Available 24 hours/day Type of Home: House Home Access: Stairs to  enter Entrance Stairs-Rails: None Entrance Stairs-Number of Steps: 1   Home Layout: One level Home Equipment: Cane - single point      Prior Function Prior Level of Function : Independent/Modified Independent             Mobility Comments: Mod indep with ADLs, household and community mobilization; intermittent use of SPC for longer, community distances.  Denies fall history; no home O2.       Hand Dominance        Extremity/Trunk Assessment   Upper Extremity Assessment Upper Extremity Assessment: Overall WFL for tasks assessed    Lower Extremity Assessment Lower Extremity Assessment: Generalized weakness (grossly 4-/5 throughout; moderate edema/bruising to R hip)       Communication   Communication: No difficulties  Cognition Arousal/Alertness: Awake/alert Behavior During Therapy: WFL for tasks assessed/performed Overall Cognitive Status: Within Functional Limits for tasks assessed                                          General Comments      Exercises Other Exercises Other Exercises: Toilet transfer, SPT With RW, min/mod assist; sit/stand from [facility] with RW, min/mod assist; standing balacne wtih rW, min assist. Other Exercises: Light grooming and oral care in sitting position set up/supervision   Assessment/Plan    PT Assessment Patient needs continued PT services  PT Problem List Decreased strength;Decreased activity tolerance;Decreased balance;Decreased mobility;Decreased coordination;Decreased cognition;Decreased knowledge of use of DME;Cardiopulmonary status limiting activity;Decreased safety awareness;Decreased knowledge of precautions       PT Treatment Interventions DME instruction;Gait training;Stair training;Functional mobility training;Therapeutic activities;Therapeutic exercise;Balance training;Patient/family education    PT Goals (Current goals can be found in the Care Plan section)  Acute Rehab PT Goals Patient Stated Goal: to  return home PT Goal Formulation: With patient Time For Goal Achievement: 09/15/21 Potential to Achieve Goals: Good    Frequency Min 2X/week   Barriers to discharge Decreased caregiver support      Co-evaluation               AM-PAC PT "6 Clicks" Mobility  Outcome Measure Help needed turning from your back to your side while in a flat bed without using bedrails?: None Help needed moving from lying on your back to sitting on the side of a flat bed without using bedrails?: A Little Help needed moving to and from a bed to a chair (including a wheelchair)?: A Lot Help needed standing up from a chair using your arms (e.g., wheelchair or bedside chair)?: A Lot Help needed to walk in hospital room?: A Lot Help needed climbing 3-5 steps with a railing? : A Lot 6 Click Score: 15    End of Session Equipment Utilized During Treatment: Gait belt Activity Tolerance: Patient tolerated treatment well Patient left: in chair;with call bell/phone within reach;with chair alarm set Nurse Communication: Mobility status PT Visit Diagnosis: Muscle weakness (generalized) (M62.81);Difficulty in walking, not elsewhere classified (R26.2)    Time: 4401-0272 PT Time Calculation (min) (ACUTE ONLY): 35 min   Charges:   PT Evaluation $PT Eval Moderate Complexity: 1 Mod PT Treatments $Therapeutic Activity: 23-37 mins        Keyaria Lawson H. Owens Shark, PT, DPT, NCS 09/01/21, 1:24 PM [phone]

## 2021-09-02 DIAGNOSIS — I214 Non-ST elevation (NSTEMI) myocardial infarction: Secondary | ICD-10-CM | POA: Diagnosis not present

## 2021-09-02 LAB — CBC
HCT: 34 % — ABNORMAL LOW (ref 36.0–46.0)
Hemoglobin: 10.8 g/dL — ABNORMAL LOW (ref 12.0–15.0)
MCH: 30.1 pg (ref 26.0–34.0)
MCHC: 31.8 g/dL (ref 30.0–36.0)
MCV: 94.7 fL (ref 80.0–100.0)
Platelets: 284 10*3/uL (ref 150–400)
RBC: 3.59 MIL/uL — ABNORMAL LOW (ref 3.87–5.11)
RDW: 13.9 % (ref 11.5–15.5)
WBC: 9.2 10*3/uL (ref 4.0–10.5)
nRBC: 0 % (ref 0.0–0.2)

## 2021-09-02 LAB — BASIC METABOLIC PANEL
Anion gap: 9 (ref 5–15)
BUN: 22 mg/dL (ref 8–23)
CO2: 26 mmol/L (ref 22–32)
Calcium: 8.8 mg/dL — ABNORMAL LOW (ref 8.9–10.3)
Chloride: 105 mmol/L (ref 98–111)
Creatinine, Ser: 0.97 mg/dL (ref 0.44–1.00)
GFR, Estimated: 55 mL/min — ABNORMAL LOW (ref >60)
Glucose, Bld: 112 mg/dL — ABNORMAL HIGH (ref 70–99)
Potassium: 4.5 mmol/L (ref 3.5–5.1)
Sodium: 140 mmol/L (ref 135–145)

## 2021-09-02 LAB — MAGNESIUM: Magnesium: 2.2 mg/dL (ref 1.7–2.4)

## 2021-09-02 MED ORDER — SODIUM CHLORIDE 0.9 % IV SOLN
100.0000 mg | Freq: Two times a day (BID) | INTRAVENOUS | Status: DC
  Administered 2021-09-02 – 2021-09-04 (×4): 100 mg via INTRAVENOUS
  Filled 2021-09-02 (×6): qty 100

## 2021-09-02 NOTE — Care Management Important Message (Signed)
Important Message  Patient Details  Name: [name] MRN: [id number] Date of Birth: [birth year]   Medicare Important Message Given:  Yes     Dannette Barbara 09/02/2021, 12:21 PM

## 2021-09-02 NOTE — Assessment & Plan Note (Signed)
Not POA, likely due to diuresis. K 3.1 on AM labs 12/1 - replaced. Monitor and replace to keep K>4. Also monitor and replace Mg.

## 2021-09-02 NOTE — Assessment & Plan Note (Signed)
As evidenced by mild-moderate fat and muscle depletion.  Related to CHF. Dietitian following. Ensure supplement drinks, multivitamin

## 2021-09-02 NOTE — Assessment & Plan Note (Signed)
POA - pt has very productive cough with thick yellow sputum. Had taken 3 days of Omnicef prescribed by PCP with some improvement intially. Negative for Covid-19, Flu A/B --sputum gram stain with GPR's and GPC's --follow sputum culture --continue Rocephin --change Zithromax to Doxycycline --sputum cx - pending --mucolytics, nebs --supplement O2 as above PRN

## 2021-09-02 NOTE — Progress Notes (Signed)
Physical Therapy Treatment Patient Details Name: [name] MRN: [id number] DOB: September 07, 1927 Today's Date: 09/02/2021   History of Present Illness 87 y.o. female with medical history significant for GERD, hypertension, osteoarthritis, non-STEMI, chronic diastolic CHF, and atrial fibrillation on Eliquis, coming with acute onset cough with associated dyspnea without chest pain or palpitations. Pt admitted for management of NSTEMI (troponin peaked 1554), Afib with RVR.  S/P cardiac cath with PCI to RCA (x2) via R femoral artery.    PT Comments    Pt received supine in bed agreeable to PT. On 2 L/min via [place] with SPO2 at 97%. Trialed on RA with Spo2 maintained at >95% at rest and post ambulation. Requires supervision for bed mobility and minguard for STS with safe hand placement and ambulation of 60'. Pt does rely on increased time to perform but no LOB or unsteadiness noted with safe use of RW sequencing throughout. Pt returned to recliner with HR up to 110 BPM post ambulation. RN aware of SPO2 and remained on RA post session. D/c recs remain appropriate to improve strength/endurance with OOB ADL's.    Recommendations for follow up therapy are one component of a multi-disciplinary discharge planning process, led by the attending physician.  Recommendations may be updated based on patient status, additional functional criteria and insurance authorization.  Follow Up Recommendations  Skilled nursing-short term rehab (<3 hours/day)     Assistance Recommended at Discharge    Equipment Recommendations  Rolling walker (2 wheels);BSC/3in1    Recommendations for Other Services       Precautions / Restrictions Precautions Precautions: Fall Restrictions Weight Bearing Restrictions: No     Mobility  Bed Mobility Overal bed mobility: Needs Assistance Bed Mobility: Supine to Sit     Supine to sit: Supervision;HOB elevated     General bed mobility comments: In recliner post session Patient  Response: Cooperative  Transfers Overall transfer level: Needs assistance Equipment used: Rolling walker (2 wheels) Transfers: Sit to/from Stand Sit to Stand: Min guard           General transfer comment: Safe use of hands from bed to stand to RW    Ambulation/Gait Ambulation/Gait assistance: Min guard Gait Distance (Feet): 60 Feet Assistive device: Rolling walker (2 wheels)             Stairs             Wheelchair Mobility    Modified Rankin (Stroke Patients Only)       Balance Overall balance assessment: Needs assistance Sitting-balance support: No upper extremity supported;Feet supported Sitting balance-Leahy Scale: Good     Standing balance support: Bilateral upper extremity supported;During functional activity;Reliant on assistive device for balance   Standing balance comment: UE's on RW for support                            Cognition Arousal/Alertness: Awake/alert Behavior During Therapy: WFL for tasks assessed/performed Overall Cognitive Status: Within Functional Limits for tasks assessed                                          Exercises      General Comments General comments (skin integrity, edema, etc.): Maintained SPO2 > 90% on RA post mobility with HR up to 110 BPM.      Pertinent Vitals/Pain Pain Assessment: Faces Faces Pain Scale: Hurts a  little bit Pain Location: R hip/groin Pain Descriptors / Indicators: Aching;Grimacing;Guarding Pain Intervention(s): Monitored during session;Repositioned    Home Living                          Prior Function            PT Goals (current goals can now be found in the care plan section) Acute Rehab PT Goals Patient Stated Goal: to return home PT Goal Formulation: With patient Time For Goal Achievement: 09/15/21 Potential to Achieve Goals: Good Progress towards PT goals: Progressing toward goals    Frequency    Min 2X/week      PT  Plan Current plan remains appropriate    Co-evaluation              AM-PAC PT "6 Clicks" Mobility   Outcome Measure  Help needed turning from your back to your side while in a flat bed without using bedrails?: None Help needed moving from lying on your back to sitting on the side of a flat bed without using bedrails?: A Little Help needed moving to and from a bed to a chair (including a wheelchair)?: A Little Help needed standing up from a chair using your arms (e.g., wheelchair or bedside chair)?: A Little Help needed to walk in hospital room?: A Little Help needed climbing 3-5 steps with a railing? : A Lot 6 Click Score: 18    End of Session Equipment Utilized During Treatment: Gait belt Activity Tolerance: Patient tolerated treatment well Patient left: in chair;with call bell/phone within reach;with chair alarm set;with nursing/sitter in room Nurse Communication: Mobility status PT Visit Diagnosis: Muscle weakness (generalized) (M62.81);Difficulty in walking, not elsewhere classified (R26.2)     Time: 0109-3235 PT Time Calculation (min) (ACUTE ONLY): 17 min  Charges:  $Therapeutic Exercise: 8-22 mins                     Salem Caster. Fairly IV, PT, DPT Physical Therapist- [facility]  09/02/2021, 3:50 PM

## 2021-09-02 NOTE — Assessment & Plan Note (Signed)
Resolved.  Present on admission - pt presented with dyspnea, tachypnea.  Requiring 2 L/min supplemental O2 to maintain spO2 > 90%. Due to A-fib RVR, NSTEMI and likely acute CHF, possible respiratory infection as well with very productive cough. CTA chest negative for PE, showed chronic changes and sequelae of prior infection in R upper/middle lobes with bronchiectasis. 12/2 - weaned to room air --Supplement O2 to keep sats > 90% --Wean O2 as tolerated --Mgmt of underlying conditions as outlined

## 2021-09-02 NOTE — Assessment & Plan Note (Signed)
Continue home Lexapro. 

## 2021-09-02 NOTE — Assessment & Plan Note (Signed)
POA with troponin peaking at 1554, then trended down.  Pt had acute onset dyspnea without chest pain.  Unclear if ACS or demand ischemia with A-fib RVR.   Placed on heparin drip on admission pending cath. Underwent cardiac cath on 08/30/21 - 2 stents placed, see cath report.  --Mgmt per cardiology --Low dose Eliquis per cardiology --ASA and Plavix --Continue statin --IV Diuresis  --Monitor and replace electrolytes --Telemetry

## 2021-09-02 NOTE — Assessment & Plan Note (Signed)
POA with HR's up to 130's-140's. Likely driven by NSTEMI, CHF, ?infection. Given Cardizem IV bolus in ED. 12/5: HR elevated to 110's overnight, 80-90's today --Cardiology following, see their recs --Continue increased Lopressor 100 mg PO BID --Amiodarone 200 mg PO BID added 12/4 --Avoid Cardizem given reduced EF --Eliquis 2.5 mg PO BID --Telemetry --Maintain K>4, Mg>2

## 2021-09-02 NOTE — Assessment & Plan Note (Signed)
Chronic, stable.  Continue PPI.

## 2021-09-02 NOTE — Progress Notes (Signed)
Progress Note    [name]   LYY:[id number]  DOB: 1926/12/01  DOA: 08/28/2021     5 Date of Service: 09/02/2021   Brief Narrative From H&P: "Crystal Richis a 87 y.o. femalewith medical history significant forGERD, hypertension, osteoarthritis, non-STEMI, chronic diastolic CHF, and atrial fibrillation on Eliquis, coming with acute onset cough with associated dyspnea without chest pain or palpitations. She denies any orthopnea or proximal nocturnal dyspnea. She has chronic significant lower extremity edema. No fever or chills." She reported two weeks of diarrhea mostly after meals, but no nausea, vomiting or abdominal pain. She had been prescribed Omnicef by PCP for presumed respiratory infection due to cough and feeling generally ill, had taken it for 3 days and initially improved prior to worsening again. Remains with very coarse productive cough.  Admitted for further evaluation and management of NSTEMI, A-fib RVR, apparent decompensated CHF, possible PNA vs bronchitis. Cardiology consulted.  Assessment and Plan * NSTEMI (non-ST elevated myocardial infarction) ([place]) POA with troponin peaking at 1554, then trended down.  Pt had acute onset dyspnea without chest pain.  Unclear if ACS or demand ischemia with A-fib RVR.   Placed on heparin drip on admission pending cath. Underwent cardiac cath on 08/30/21 - 2 stents placed, see cath report.  --Mgmt per cardiology --Low dose Eliquis per cardiology --ASA and Plavix --Continue statin --IV Diuresis  --Monitor and replace electrolytes --Telemetry  Malnutrition of moderate degree As evidenced by mild-moderate fat and muscle depletion.  Related to CHF. Dietitian following. Ensure supplement drinks, multivitamin  Hypokalemia Not POA, likely due to diuresis. K 3.1 on AM labs 12/1 - replaced. Monitor and replace to keep K>4. Also monitor and replace Mg.  Acute systolic CHF (congestive heart failure) ([place]) Cardiac cath on 12/2  revealed reduced LV EF of 35-40%.   2D echo 12/1 showed EF 55-60% with regional [facility] --Cardiology managing --On IV Lasix 20 mg BID- renal function stable, continue --Continue metoprolol --Strict I/O's and daily weights --Monitor renal function and electrolytes  Atrial fibrillation with rapid ventricular response ([place]) POA with HR's up to 130's-140's. Likely driven by NSTEMI, CHF, ?infection. Given Cardizem IV bolus in ED. 12/5: HR elevated to 110's overnight, 80-90's today --Cardiology following, see their recs --Continue increased Lopressor 100 mg PO BID --Amiodarone 200 mg PO BID added 12/4 --Avoid Cardizem given reduced EF --Eliquis 2.5 mg PO BID --Telemetry --Maintain K>4, Mg>2   Acute respiratory failure with hypoxia ([place]) Resolved.  Present on admission - pt presented with dyspnea, tachypnea.  Requiring 2 L/min supplemental O2 to maintain spO2 > 90%. Due to A-fib RVR, NSTEMI and likely acute CHF, possible respiratory infection as well with very productive cough. CTA chest negative for PE, showed chronic changes and sequelae of prior infection in R upper/middle lobes with bronchiectasis. 12/2 - weaned to room air --Supplement O2 to keep sats > 90% --Wean O2 as tolerated --Mgmt of underlying conditions as outlined  Community acquired pneumonia POA - pt has very productive cough with thick yellow sputum. Had taken 3 days of Omnicef prescribed by PCP with some improvement intially. Negative for Covid-19, Flu A/B --sputum gram stain with GPR's and GPC's --follow sputum culture --continue Rocephin --change Zithromax to Doxycycline --sputum cx - pending --mucolytics, nebs --supplement O2 as above PRN  Diarrhea POA - pt reported diarrhea following meals for about two weeks, onset before she was started on antibiotics outpatient. C diff and GI panel were negative --d/c enteric precautions  --monitor   GERD (gastroesophageal reflux disease)  Chronic, stable.  Continue  PPI.  Depression Continue home Lexapro     Subjective:  Pt reports overall feeling better.  She continues to have productive cough, slowly improving.  Had recurrent diarrhea which she reported on admission and had resolved.  States she gets this intermittently with certain foods at baseline.  No abdominal pain, N/V or F/C.  Leg swelling better.  No other acute complaints.   Objective Vitals:   09/02/21 1143 09/02/21 1248 09/02/21 1249 09/02/21 1419  BP: 107/62     Pulse: 85     Resp: 17     Temp: 98.6 F (37 C)     TempSrc:      SpO2: 93% 90% 94% 95%  Weight:      Height:       71.5 kg  Vital signs were reviewed and unremarkable except for: Heart rate up to 110's overnight   Exam General exam: awake, alert, no acute distress HEENT: moist mucus membranes, hearing grossly normal  Respiratory system: Remains most coarse rhonchi bilaterally, no wheezes, normal respiratory effort, on room air. Cardiovascular system: normal S1/S2, RRR, significant improvement in bilateral lower extremity pitting edema.   Gastrointestinal system: soft, nontender abdomen. Central nervous system: A&O x3. no gross focal neurologic deficits, normal speech Skin: dry, intact, normal temperature Psychiatry: normal mood, congruent affect, judgement and insight appear normal    Labs / Other Information My review of labs, imaging, notes and other tests is significant for Stable renal function on diuresis but creatinine slightly increased from 0.80-0.97, electrolytes stable, hemoglobin 10.8 stable     Disposition Plan: Status is: Inpatient  Remains inpatient appropriate because: Remains on IV diuresis.  SNF placement pending for short-term rehab.     Time spent: 30 minutes [facility] 09/02/2021, 4:52 PM

## 2021-09-02 NOTE — Assessment & Plan Note (Signed)
Cardiac cath on 12/2 revealed reduced LV EF of 35-40%.   2D echo 12/1 showed EF 55-60% with regional [facility] --Cardiology managing --On IV Lasix 20 mg BID- renal function stable, continue --Continue metoprolol --Strict I/O's and daily weights --Monitor renal function and electrolytes

## 2021-09-02 NOTE — Assessment & Plan Note (Signed)
POA - pt reported diarrhea following meals for about two weeks, onset before she was started on antibiotics outpatient. C diff and GI panel were negative --d/c enteric precautions  --monitor

## 2021-09-03 DIAGNOSIS — I214 Non-ST elevation (NSTEMI) myocardial infarction: Secondary | ICD-10-CM | POA: Diagnosis not present

## 2021-09-03 LAB — MAGNESIUM: Magnesium: 1.9 mg/dL (ref 1.7–2.4)

## 2021-09-03 LAB — CBC
HCT: 34.6 % — ABNORMAL LOW (ref 36.0–46.0)
Hemoglobin: 10.8 g/dL — ABNORMAL LOW (ref 12.0–15.0)
MCH: 30.4 pg (ref 26.0–34.0)
MCHC: 31.2 g/dL (ref 30.0–36.0)
MCV: 97.5 fL (ref 80.0–100.0)
Platelets: 292 10*3/uL (ref 150–400)
RBC: 3.55 MIL/uL — ABNORMAL LOW (ref 3.87–5.11)
RDW: 14 % (ref 11.5–15.5)
WBC: 9.5 10*3/uL (ref 4.0–10.5)
nRBC: 0 % (ref 0.0–0.2)

## 2021-09-03 LAB — BASIC METABOLIC PANEL
Anion gap: 8 (ref 5–15)
BUN: 23 mg/dL (ref 8–23)
CO2: 28 mmol/L (ref 22–32)
Calcium: 8.7 mg/dL — ABNORMAL LOW (ref 8.9–10.3)
Chloride: 103 mmol/L (ref 98–111)
Creatinine, Ser: 0.97 mg/dL (ref 0.44–1.00)
GFR, Estimated: 55 mL/min — ABNORMAL LOW (ref >60)
Glucose, Bld: 103 mg/dL — ABNORMAL HIGH (ref 70–99)
Potassium: 4.4 mmol/L (ref 3.5–5.1)
Sodium: 139 mmol/L (ref 135–145)

## 2021-09-03 LAB — CULTURE, RESPIRATORY W GRAM STAIN
Culture: NORMAL
Gram Stain: NONE SEEN

## 2021-09-03 MED ORDER — FUROSEMIDE 40 MG PO TABS
40.0000 mg | ORAL_TABLET | Freq: Two times a day (BID) | ORAL | Status: DC
  Administered 2021-09-03 – 2021-09-06 (×6): 40 mg via ORAL
  Filled 2021-09-03 (×6): qty 1

## 2021-09-03 MED ORDER — TRAZODONE HCL 50 MG PO TABS
25.0000 mg | ORAL_TABLET | Freq: Every evening | ORAL | Status: DC | PRN
  Administered 2021-09-04 – 2021-09-05 (×2): 25 mg via ORAL
  Filled 2021-09-03 (×2): qty 1

## 2021-09-03 MED ORDER — MAGNESIUM SULFATE 2 GM/50ML IV SOLN
2.0000 g | Freq: Once | INTRAVENOUS | Status: AC
  Administered 2021-09-03: 2 g via INTRAVENOUS
  Filled 2021-09-03: qty 50

## 2021-09-03 MED ORDER — MELATONIN 5 MG PO TABS
5.0000 mg | ORAL_TABLET | Freq: Every day | ORAL | Status: DC
  Administered 2021-09-03 – 2021-09-05 (×3): 5 mg via ORAL
  Filled 2021-09-03 (×3): qty 1

## 2021-09-03 MED ORDER — FUROSEMIDE 40 MG PO TABS: 40.0000 mg | ORAL_TABLET | Freq: Every day | ORAL | Status: DC

## 2021-09-03 NOTE — TOC Initial Note (Signed)
Transition of Care [facility]) - Initial/Assessment Note    Patient Details  Name: [name] MRN: [id number] Date of Birth: 09-16-1927  Transition of [facility]) CM/SW Contact:    Alberteen Sam, LCSW Phone Number: 09/03/2021, 9:37 AM  Clinical Narrative:                  CSW spoke with patient regarding SNF recommendation from PT, patient reports being in agreement with rehab with no facility preference. She requests CSW call her niece to keep updated, niece has been updated that bed search has been started.   Pending bed offers at this time   Expected Discharge Plan: [place] Barriers to Discharge: Continued Medical Work up   Patient Goals and CMS Choice Patient states their goals for this hospitalization and ongoing recovery are:: to go home CMS Medicare.gov Compare Post Acute Care list provided to:: Patient Choice offered to / list presented to : Patient  Expected Discharge Plan and Services Expected Discharge Plan: [place]       Living arrangements for the past 2 months: Single Family Home                                      Prior Living Arrangements/Services Living arrangements for the past 2 months: Single Family Home Lives with:: Self Patient language and need for interpreter reviewed:: Yes        Need for Family Participation in Patient Care: Yes (Comment) Care giver support system in place?: Yes (comment)   Criminal Activity/Legal Involvement Pertinent to Current Situation/Hospitalization: No - Comment as needed  Activities of Daily Living Home Assistive Devices/Equipment: Cane (specify quad or straight) ADL Screening (condition at time of admission) Patient's cognitive ability adequate to safely complete daily activities?: Yes Is the patient deaf or have difficulty hearing?: No Does the patient have difficulty seeing, even when wearing glasses/contacts?: No Does the patient have difficulty concentrating, remembering, or  making decisions?: No Patient able to express need for assistance with ADLs?: Yes Does the patient have difficulty dressing or bathing?: No Independently performs ADLs?: Yes (appropriate for developmental age) Does the patient have difficulty walking or climbing stairs?: Yes Weakness of Legs: Both Weakness of Arms/Hands: Both  Permission Sought/Granted                  Emotional Assessment   Attitude/Demeanor/Rapport: Gracious Affect (typically observed): Calm Orientation: : Oriented to Self, Oriented to Place, Oriented to  Time, Oriented to Situation Alcohol / Substance Use: Not Applicable Psych Involvement: No (comment)  Admission diagnosis:  Shortness of breath [R06.02] NSTEMI (non-ST elevated myocardial infarction) ([place]) [I21.4] Atrial fibrillation with rapid ventricular response ([place]) [I48.91] Acute on chronic congestive heart failure, unspecified heart failure type ([place]) [I50.9] Patient Active Problem List   Diagnosis Date Noted   Malnutrition of moderate degree 77/82/4235   Acute systolic CHF (congestive heart failure) ([place]) 08/30/2021   Diarrhea 08/29/2021   Depression 08/29/2021   Hypokalemia 08/29/2021   NSTEMI (non-ST elevated myocardial infarction) ([place]) 08/28/2021   AKI (acute kidney injury) ([place])    Hypoxia    RLL pneumonia 08/27/2020   Acute respiratory failure with hypoxia ([place]) 08/27/2020   Hyponatremia 08/27/2020   Community acquired pneumonia 08/27/2020   Atrial fibrillation with rapid ventricular response ([place]) 08/27/2020   DNR (do not resuscitate)/DNI(Do Not Intubate) 08/27/2020   SOB (shortness of breath) 06/30/2018   GERD (gastroesophageal  reflux disease) 04/29/2018   Hypertension 03/02/2018   Arthritis 03/02/2018   Pain in limb 03/02/2018   HTN (hypertension), benign 01/08/2014   PCP:  Rusty Aus, MD Pharmacy:   [place], [place] - [address] [place] [address] Phone: [phone] Fax:  [phone]     Social Determinants of Health (SDOH) Interventions    Readmission Risk Interventions No flowsheet data found.

## 2021-09-03 NOTE — Progress Notes (Signed)
[facility] Cardiology    SUBJECTIVE: She states she feels reasonably well no chest pain no significant shortness of breath resting comfortably in bed now receiving physical and occupational therapy to regain her strength prior to discharge awaiting skilled nursing placement for rehab   Vitals:   09/03/21 0207 09/03/21 0400 09/03/21 0803 09/03/21 1134  BP:  135/82 132/76 (!) 111/59  Pulse:  90 100 91  Resp:  20 17 17   Temp:  98 F (36.7 C) 98.3 F (36.8 C) 98.7 F (37.1 C)  TempSrc:  Oral    SpO2:  96% 90% 90%  Weight: 73.4 kg     Height:         Intake/Output Summary (Last 24 hours) at 09/03/2021 1207 Last data filed at 09/03/2021 1505 Gross per 24 hour  Intake 1562.41 ml  Output 1150 ml  Net 412.41 ml      PHYSICAL EXAM  General: Well developed, well nourished, in no acute distress HEENT:  Normocephalic and atramatic Neck:  No JVD.  Lungs: Clear bilaterally to auscultation and percussion. Heart: HRRR . Normal S1 and S2 without gallops or murmurs.  Abdomen: Bowel sounds are positive, abdomen soft and non-tender  Msk:  Back normal, normal gait. Normal strength and tone for age. Extremities: No clubbing, cyanosis or edema.   Neuro: Alert and oriented X 3. Psych:  Good affect, responds appropriately   LABS: Basic Metabolic Panel: Recent Labs    09/02/21 0457 09/03/21 0456  NA 140 139  K 4.5 4.4  CL 105 103  CO2 26 28  GLUCOSE 112* 103*  BUN 22 23  CREATININE 0.97 0.97  CALCIUM 8.8* 8.7*  MG 2.2 1.9   Liver Function Tests: No results for input(s): AST, ALT, ALKPHOS, BILITOT, PROT, ALBUMIN in the last 72 hours. No results for input(s): LIPASE, AMYLASE in the last 72 hours. CBC: Recent Labs    09/02/21 0457 09/03/21 0456  WBC 9.2 9.5  HGB 10.8* 10.8*  HCT 34.0* 34.6*  MCV 94.7 97.5  PLT 284 292   Cardiac Enzymes: No results for input(s): CKTOTAL, CKMB, CKMBINDEX, TROPONINI in the last 72 hours. BNP: Invalid input(s): POCBNP D-Dimer: No results for  input(s): DDIMER in the last 72 hours. Hemoglobin A1C: No results for input(s): HGBA1C in the last 72 hours. Fasting Lipid Panel: No results for input(s): CHOL, HDL, LDLCALC, TRIG, CHOLHDL, LDLDIRECT in the last 72 hours. Thyroid Function Tests: No results for input(s): TSH, T4TOTAL, T3FREE, THYROIDAB in the last 72 hours.  Invalid input(s): FREET3 Anemia Panel: No results for input(s): VITAMINB12, FOLATE, FERRITIN, TIBC, IRON, RETICCTPCT in the last 72 hours.  No results found.     TELEMETRY: Fibrillation rate of around 100:  ASSESSMENT AND PLAN:  Principal Problem:   NSTEMI (non-ST elevated myocardial infarction) ([place]) Active Problems:   GERD (gastroesophageal reflux disease)   Acute respiratory failure with hypoxia ([place])   Community acquired pneumonia   Atrial fibrillation with rapid ventricular response ([facility])   Diarrhea   Depression   Hypokalemia   Acute systolic CHF (congestive heart failure) ([facility])   Malnutrition of moderate degree    Plan Non-STEMI stable improved status post PCI and stent continue current medical therapy Continue aspirin Plavix statin beta-blocker ACE ARB Atrial fibrillation currently on anticoagulation with Eliquis twice a day Possible community-acquired pneumonia recommended antibiotic therapy as necessary Electrolyte abnormalities recommend correct electrolytes Recommend physical and occupational therapy prior to discharge Patient may benefit from skilled nursing for rehab since she lives alone  Yolonda Kida, MD, 09/03/2021 12:07 PM

## 2021-09-03 NOTE — Assessment & Plan Note (Signed)
Not POA, likely due to diuresis. K 3.1 on AM labs 12/1 - replaced. Monitor and replace to keep K>4. Also monitor and replace Mg.

## 2021-09-03 NOTE — Assessment & Plan Note (Signed)
Resolved.  Present on admission - pt presented with dyspnea, tachypnea.  Requiring 2 L/min supplemental O2 to maintain spO2 > 90%. Due to A-fib RVR, NSTEMI and likely acute CHF, possible respiratory infection as well with very productive cough. CTA chest negative for PE, showed chronic changes and sequelae of prior infection in R upper/middle lobes with bronchiectasis. 12/2 - weaned to room air --Supplement O2 PRN to keep sats > 90% --Mgmt of underlying conditions as outlined

## 2021-09-03 NOTE — Assessment & Plan Note (Signed)
POA - pt has very productive cough with thick yellow sputum. Had taken 3 days of Omnicef prescribed by PCP with some improvement intially. Negative for Covid-19, Flu A/B 12/6: persistent deep productive cough --sputum gram stain with GPR's and GPC's --follow sputum culture --continue Rocephin --changed Zithromax to Doxycycline --sputum cx - pending --mucolytics, nebs --supplement O2 as above PRN --add chest PT

## 2021-09-03 NOTE — Progress Notes (Signed)
Occupational Therapy Treatment Patient Details Name: [name] MRN: [id number] DOB: 1926-10-05 Today's Date: 09/03/2021   History of present illness 87 y.o. female with medical history significant for GERD, hypertension, osteoarthritis, non-STEMI, chronic diastolic CHF, and atrial fibrillation on Eliquis, coming with acute onset cough with associated dyspnea without chest pain or palpitations. Pt admitted for management of NSTEMI (troponin peaked 1554), Afib with RVR.  S/P cardiac cath with PCI to RCA (x2) via R femoral artery.   OT comments  Pt seen for OT treatment on this date. Upon arrival to room, pt awake and in semi-fowler's position in bed coughing (HR 110bpm). Pt encouraged to transfer to recliner to promote upright posture/optimal breathing position; pt agreeable. Pt required only SUPERVISION for bed mobility and MIN GUARD for sit>stand transfer with RW. While walking within room, pt reporting urge to use BSC. Pt required MIN GUARD for BSC transfer and MIN GUARD for initiating posterior peri-care following BM. Pt noted to have HR 130s during seated peri-care, with pt quickly fatiguing and ultimately requiring MIN A to ensure thoroughness with peri-care. At end of session, pt left seated in recliner, with all needs within reach and HR returning to resting HR of 110s. RN informed of elevated HR during session. Pt continues to benefit from skilled OT services to maximize return to PLOF and minimize risk of future falls, injury, caregiver burden, and readmission. Will continue to follow POC. Discharge recommendation remains appropriate.     Recommendations for follow up therapy are one component of a multi-disciplinary discharge planning process, led by the attending physician.  Recommendations may be updated based on patient status, additional functional criteria and insurance authorization.    Follow Up Recommendations  Skilled nursing-short term rehab (<3 hours/day) (may progress to discharge  with HHOT if family able to provide physical assist with functional transfers)    Assistance Recommended at Discharge Frequent or constant Supervision/Assistance  Equipment Recommendations  None recommended by OT (pt has all necessary DME)       Precautions / Restrictions Precautions Precautions: Fall Restrictions Weight Bearing Restrictions: No       Mobility Bed Mobility Overal bed mobility: Needs Assistance Bed Mobility: Supine to Sit     Supine to sit: Supervision;HOB elevated          Transfers Overall transfer level: Needs assistance Equipment used: Rolling walker (2 wheels) Transfers: Sit to/from Stand Sit to Stand: Min guard                 Balance Overall balance assessment: Needs assistance Sitting-balance support: No upper extremity supported;Feet supported Sitting balance-Leahy Scale: Good Sitting balance - Comments: Good sitting balance during peri-care   Standing balance support: Bilateral upper extremity supported;During functional activity;Reliant on assistive device for balance Standing balance-Leahy Scale: Fair                             ADL either performed or assessed with clinical judgement   ADL Overall ADL's : Needs assistance/impaired     Grooming: Wash/dry hands;Supervision/safety;Set up;Sitting               Lower Body Dressing: Min guard;Sitting/lateral leans Lower Body Dressing Details (indicate cue type and reason): to don/doff socks Toilet Transfer: Min guard;Ambulation;BSC/3in1;Rolling walker (2 wheels)   Toileting- Clothing Manipulation and Hygiene: Minimal assistance;Sitting/lateral lean Toileting - Clothing Manipulation Details (indicate cue type and reason): Requires MIN A for thoroughness d/t pt fatiguing quickly  Functional mobility during ADLs: Min guard;Rolling walker (2 wheels) (to walk 20 ft)        Cognition Arousal/Alertness: Awake/alert Behavior During Therapy: WFL for tasks  assessed/performed Overall Cognitive Status: Within Functional Limits for tasks assessed                                                  General Comments HR 130s during seated peri-care, returning to resting HR of 110 at end of session. RN informed    Pertinent Vitals/ Pain       Pain Assessment: Faces Faces Pain Scale: Hurts a little bit Pain Location: R hip/groin Pain Descriptors / Indicators: Aching;Grimacing;Guarding Pain Intervention(s): Limited activity within patient's tolerance;Monitored during session;Repositioned         Frequency  Min 3X/week        Progress Toward Goals  OT Goals(current goals can now be found in the care plan section)  Progress towards OT goals: Progressing toward goals  Acute Rehab OT Goals Patient Stated Goal: to increase activity tolerance OT Goal Formulation: With patient Time For Goal Achievement: 09/15/21 Potential to Achieve Goals: Good  Plan Discharge plan remains appropriate;Frequency remains appropriate       AM-PAC OT "6 Clicks" Daily Activity     Outcome Measure   Help from another person eating meals?: None Help from another person taking care of personal grooming?: A Little Help from another person toileting, which includes using toliet, bedpan, or urinal?: A Little Help from another person bathing (including washing, rinsing, drying)?: A Lot Help from another person to put on and taking off regular upper body clothing?: A Little Help from another person to put on and taking off regular lower body clothing?: A Little 6 Click Score: 18    End of Session Equipment Utilized During Treatment: Rolling walker (2 wheels)  OT Visit Diagnosis: Muscle weakness (generalized) (M62.81)   Activity Tolerance Patient tolerated treatment well   Patient Left in chair;with call bell/phone within reach;with chair alarm set   Nurse Communication Mobility status;Other (comment) (elevated HR)        Time:  8657-8469 OT Time Calculation (min): 32 min  Charges: OT General Charges $OT Visit: 1 Visit OT Treatments $Self Care/Home Management : 23-37 mins  Fredirick Maudlin, OTR/L [place]

## 2021-09-03 NOTE — Assessment & Plan Note (Signed)
POA - pt reported diarrhea following meals for about two weeks, onset before she was started on antibiotics outpatient. C diff and GI panel were negative --d/c enteric precautions  --monitor

## 2021-09-03 NOTE — Progress Notes (Signed)
Progress Note    Fahima Cifelli   TMH:[id number]  DOB: Apr 28, 1927  DOA: 08/28/2021     6 Date of Service: 09/03/2021    Brief Narrative From H&P: "Crystal Richis a 87 y.o. femalewith medical history significant forGERD, hypertension, osteoarthritis, non-STEMI, chronic diastolic CHF, and atrial fibrillation on Eliquis, coming with acute onset cough with associated dyspnea without chest pain or palpitations. She denies any orthopnea or proximal nocturnal dyspnea. She has chronic significant lower extremity edema. No fever or chills." She reported two weeks of diarrhea mostly after meals, but no nausea, vomiting or abdominal pain. She had been prescribed Omnicef by PCP for presumed respiratory infection due to cough and feeling generally ill, had taken it for 3 days and initially improved prior to worsening again. Remains with very coarse productive cough.  Admitted for further evaluation and management of NSTEMI, A-fib RVR, apparent decompensated CHF, possible PNA vs bronchitis. Cardiology consulted.    Assessment and Plan * NSTEMI (non-ST elevated myocardial infarction) ([place]) POA with troponin peaking at 1554, then trended down.  Pt had acute onset dyspnea without chest pain.  Unclear if ACS or demand ischemia with A-fib RVR.   Placed on heparin drip on admission pending cath. Underwent cardiac cath on 08/30/21 - 2 stents placed, see cath report.  --Mgmt per cardiology --Low dose Eliquis per cardiology --ASA and Plavix --Continue statin -- Transition to p.o. Lasix as below --Monitor and replace electrolytes --Telemetry  Malnutrition of moderate degree As evidenced by mild-moderate fat and muscle depletion.  Related to CHF. Dietitian following. Ensure supplement drinks, multivitamin  Hypokalemia Not POA, likely due to diuresis. K 3.1 on AM labs 12/1 - replaced. Monitor and replace to keep K>4. Also monitor and replace Mg.  Acute systolic CHF (congestive heart failure)  ([place]) Cardiac cath on 12/2 revealed reduced LV EF of 35-40%.   2D echo 12/1 showed EF 55-60% with regional [facility] Diuresed several days on IV Lasix 20 mg BID. Renal function stable. Lower extremity edema significantly improved. --Cardiology managing --Transition to PO Lasix 40 mg BID and monitor --Continue metoprolol --Strict I/O's and daily weights --Monitor renal function and electrolytes  Atrial fibrillation with rapid ventricular response (HCC) POA with HR's up to 130's-140's. Likely driven by NSTEMI, CHF, ?infection. Given Cardizem IV bolus in ED. 12/5: HR elevated to 110's overnight, 80-90's today --Cardiology following, see their recs --Continue increased Lopressor 100 mg PO BID --Amiodarone 200 mg PO BID added 12/4 --Avoid Cardizem given reduced EF --Eliquis 2.5 mg PO BID --Telemetry --Maintain K>4, Mg>2   Acute respiratory failure with hypoxia ([place]) Resolved.  Present on admission - pt presented with dyspnea, tachypnea.  Requiring 2 L/min supplemental O2 to maintain spO2 > 90%. Due to A-fib RVR, NSTEMI and likely acute CHF, possible respiratory infection as well with very productive cough. CTA chest negative for PE, showed chronic changes and sequelae of prior infection in R upper/middle lobes with bronchiectasis. 12/2 - weaned to room air --Supplement O2 PRN to keep sats > 90% --Mgmt of underlying conditions as outlined  Community acquired pneumonia POA - pt has very productive cough with thick yellow sputum. Had taken 3 days of Omnicef prescribed by PCP with some improvement intially. Negative for Covid-19, Flu A/B 12/6: persistent deep productive cough --sputum gram stain with GPR's and GPC's --follow sputum culture --continue Rocephin --changed Zithromax to Doxycycline --sputum cx - pending --mucolytics, nebs --supplement O2 as above PRN --add chest PT  Diarrhea POA - pt reported diarrhea following meals for about  two weeks, onset before she was started on  antibiotics outpatient. C diff and GI panel were negative --d/c enteric precautions  --monitor   GERD (gastroesophageal reflux disease) Chronic, stable.  Continue PPI.  Depression Continue home Lexapro     Subjective:  Patient up in recliner when seen today.  She reports still having the same deep productive cough, not much better.  Still coughing up thick yellow sputum.  Denies fevers or chills.  No chest pain or shortness of breath at rest.  Denies palpitations.  Not sleeping well at night and requests something to help with this.  No other acute complaints at this time.  Objective Vitals:   09/03/21 0207 09/03/21 0400 09/03/21 0803 09/03/21 1134  BP:  135/82 132/76 (!) 111/59  Pulse:  90 100 91  Resp:  20 17 17   Temp:  98 F (36.7 C) 98.3 F (36.8 C) 98.7 F (37.1 C)  TempSrc:  Oral    SpO2:  96% 90% 90%  Weight: 73.4 kg     Height:       73.4 kg  Vital signs were reviewed and unremarkable.  Per OT note of today, heart rate increased to 130s with ADLs.  Overall heart rate better controlled.   Exam General exam: awake, alert, no acute distress, appears fatigued HEENT: atraumatic, clear conjunctiva, anicteric sclera, moist mucus membranes, hearing grossly normal  Respiratory system: Persistent diffuse rhonchi, no wheezes, normal respiratory effort at rest, on room air. Cardiovascular system: normal S1/S2, RRR, lower extremity edema significantly improved more persistent in the feet.   Central nervous system: A&O x3. no gross focal neurologic deficits, normal speech Skin: dry, intact, normal temperature, normal color, No rashes, lesions or ulcers seen on visualized skin Psychiatry: normal mood, congruent affect, judgement and insight appear normal    Labs / Other Information My review of labs, imaging, notes and other tests is significant for Stable renal function with creatinine 0.97, stable hemoglobin at 10.8     Disposition Plan: Status is:  Inpatient  Remains inpatient appropriate because: Ongoing episodes of A. fib with RVR, awaiting sputum culture to guide antibiotic therapy given ongoing persistent productive cough.        Time spent: 25 minutes [facility] 09/03/2021, 4:30 PM

## 2021-09-03 NOTE — Assessment & Plan Note (Signed)
Cardiac cath on 12/2 revealed reduced LV EF of 35-40%.   2D echo 12/1 showed EF 55-60% with regional [facility] Diuresed several days on IV Lasix 20 mg BID. Renal function stable. Lower extremity edema significantly improved. --Cardiology managing --Transition to PO Lasix 40 mg BID and monitor --Continue metoprolol --Strict I/O's and daily weights --Monitor renal function and electrolytes

## 2021-09-03 NOTE — Assessment & Plan Note (Signed)
As evidenced by mild-moderate fat and muscle depletion.  Related to CHF. Dietitian following. Ensure supplement drinks, multivitamin

## 2021-09-03 NOTE — Assessment & Plan Note (Signed)
Continue home Lexapro. 

## 2021-09-03 NOTE — Consult Note (Addendum)
   Heart Failure Nurse Navigator Note  HFrEF by heart catheterization 35 to 40%.  She presented to the emergency room with complaints of shortness of breath and a cough.  Comorbidities:  Arthritis Hypertension GERD Atrial fibrillation on Eliquis  She has chronic lower extremity edema.  Medications:  Amiodarone 200 mg 2 times a day Eliquis 2.5 mg 2 times a day Aspirin 81 mg daily Atorvastatin 80 mg daily Plavix 75 mg daily Furosemide 20 mg IV every 12 Metoprolol tartrate 100 mg 2 times a day Potassium chloride 20 mEq 2 times a day  Labs:  Sodium 139, potassium 4.4, chloride 103, CO2 28, BUN 23, creatinine 0.97 Weight 73.4 kg Blood pressure 111/59   Initial meeting with patient today, she was lying in bed, states she is still bothered by the cough and she is not sleeping well at night.  Patient states that she lives by herself and does some of the housekeeping and would drive herself where she needed to go.  He also prepares her own meals.  She does not use salt at the table and uses Mrs. Dash for seasoning.  We will fix meat vegetable and a potato for her meals.  Discussed fluid restriction but she did not feel that she went over 64 ounces as she mostly drinks water during the day.  Discussed the importance of weighing daily and reporting 2 to 3 pound weight gain overnight or 5 pounds within the week.  Also to report changes in symptoms such as increasing shortness of breath, lower extremity edema, decreased appetite, PND and orthopnea.  She was given the living with heart failure teaching booklet, zone magnet and information on low-sodium.  She states that she was also interested in looking at the heart failure videos but due to the iPad not being in charge she was unable to do that at this time.  Will continue to follow along.  Pricilla Riffle RN CHFN

## 2021-09-03 NOTE — Assessment & Plan Note (Signed)
POA with troponin peaking at 1554, then trended down.  Pt had acute onset dyspnea without chest pain.  Unclear if ACS or demand ischemia with A-fib RVR.   Placed on heparin drip on admission pending cath. Underwent cardiac cath on 08/30/21 - 2 stents placed, see cath report.  --Mgmt per cardiology --Low dose Eliquis per cardiology --ASA and Plavix --Continue statin -- Transition to p.o. Lasix as below --Monitor and replace electrolytes --Telemetry

## 2021-09-03 NOTE — Assessment & Plan Note (Signed)
POA with HR's up to 130's-140's. Likely driven by NSTEMI, CHF, ?infection. Given Cardizem IV bolus in ED. 12/5: HR elevated to 110's overnight, 80-90's today --Cardiology following, see their recs --Continue increased Lopressor 100 mg PO BID --Amiodarone 200 mg PO BID added 12/4 --Avoid Cardizem given reduced EF --Eliquis 2.5 mg PO BID --Telemetry --Maintain K>4, Mg>2

## 2021-09-03 NOTE — Assessment & Plan Note (Signed)
Chronic, stable.  Continue PPI.

## 2021-09-03 NOTE — NC FL2 (Signed)
[place] LEVEL OF CARE SCREENING TOOL     IDENTIFICATION  Patient Name: [name] Birthdate: 03/16/1927 Sex: female Admission Date (Current Location): 08/28/2021  [facility] and [place] Number:  Engineering geologist and Address:  [facility], [address] [place], [place] [address]      Provider Number: [id number]  Attending Physician Name and Address:  Ezekiel Slocumb, DO  Relative Name and Phone Number:  Juliann Pulse (niece) [phone]    Current Level of Care: Hospital Recommended Level of Care: [place] Prior Approval Number:    Date Approved/Denied:   PASRR Number: [id number]  Discharge Plan: SNF    Current Diagnoses: Patient Active Problem List   Diagnosis Date Noted   Malnutrition of moderate degree 33/00/7622   Acute systolic CHF (congestive heart failure) ([place]) 08/30/2021   Diarrhea 08/29/2021   Depression 08/29/2021   Hypokalemia 08/29/2021   NSTEMI (non-ST elevated myocardial infarction) ([place]) 08/28/2021   AKI (acute kidney injury) ([place])    Hypoxia    RLL pneumonia 08/27/2020   Acute respiratory failure with hypoxia ([place]) 08/27/2020   Hyponatremia 08/27/2020   Community acquired pneumonia 08/27/2020   Atrial fibrillation with rapid ventricular response ([place]) 08/27/2020   DNR (do not resuscitate)/DNI(Do Not Intubate) 08/27/2020   SOB (shortness of breath) 06/30/2018   GERD (gastroesophageal reflux disease) 04/29/2018   Hypertension 03/02/2018   Arthritis 03/02/2018   Pain in limb 03/02/2018   HTN (hypertension), benign 01/08/2014    Orientation RESPIRATION BLADDER Height & Weight     Self, Time, Situation, Place  Normal External catheter, Incontinent Weight: 161 lb 13.1 oz (73.4 kg) Height:  5' 5.5" (166.4 cm)  BEHAVIORAL SYMPTOMS/MOOD NEUROLOGICAL BOWEL NUTRITION STATUS      Incontinent Diet (see discharge summary)  AMBULATORY STATUS COMMUNICATION OF NEEDS Skin   Limited Assist Verbally  Normal                       Personal Care Assistance Level of Assistance  Bathing, Feeding, Dressing, Total care Bathing Assistance: Limited assistance Feeding assistance: Independent Dressing Assistance: Limited assistance Total Care Assistance: Limited assistance   Functional Limitations Info  Sight, Hearing, Speech Sight Info: Impaired Hearing Info: Adequate Speech Info: Adequate    SPECIAL CARE FACTORS FREQUENCY  PT (By licensed PT), OT (By licensed OT)     PT Frequency: min 4x weekly OT Frequency: min 4x weekly            Contractures Contractures Info: Not present    Additional Factors Info  Code Status, Allergies Code Status Info: DNR Allergies Info: Amoxicillin-pot Clavulanate   Cefprozil   Clarithromycin   Etodolac   Prednisone   Pseudoephedrine   Penicillins   Sulfa Antibiotics           Current Medications (09/03/2021):  This is the current hospital active medication list Current Facility-Administered Medications  Medication Dose Route Frequency Provider Last Rate Last Admin   0.9 %  sodium chloride infusion  250 mL Intravenous PRN Callwood, Dwayne D, MD 10 mL/hr at 09/03/21 0111 250 mL at 09/03/21 0111   acetaminophen (TYLENOL) tablet 650 mg  650 mg Oral Q4H PRN Callwood, Dwayne D, MD   650 mg at 09/01/21 2246   ALPRAZolam (XANAX) tablet 0.25 mg  0.25 mg Oral BID PRN Mansy, Jan A, MD   0.25 mg at 09/02/21 6333   amiodarone (PACERONE) tablet 200 mg  200 mg Oral BID Eugenie Filler, MD  200 mg at 09/02/21 2146   apixaban (ELIQUIS) tablet 2.5 mg  2.5 mg Oral BID Callwood, Dwayne D, MD   2.5 mg at 09/02/21 2146   aspirin chewable tablet 81 mg  81 mg Oral Daily Callwood, Dwayne D, MD   81 mg at 09/02/21 0851   atorvastatin (LIPITOR) tablet 80 mg  80 mg Oral Daily Mansy, Jan A, MD   80 mg at 09/02/21 2992   calcium-vitamin D (OSCAL WITH D) 500-5 MG-MCG per tablet 2 tablet  2 tablet Oral Q breakfast Mansy, Jan A, MD   2 tablet at 09/02/21 0851    carboxymethylcellul-glycerin (REFRESH OPTIVE) 0.5-0.9 % ophthalmic solution 1 drop  1 drop Both Eyes QID PRN Mansy, Jan A, MD       clopidogrel (PLAVIX) tablet 75 mg  75 mg Oral Q breakfast Callwood, Dwayne D, MD   75 mg at 09/02/21 0851   dextromethorphan-guaiFENesin (Murray DM) 30-600 MG per 12 hr tablet 1 tablet  1 tablet Oral BID Mansy, Jan A, MD   1 tablet at 09/02/21 2146   doxycycline (VIBRAMYCIN) 100 mg in sodium chloride 0.9 % 250 mL IVPB  100 mg Intravenous Q12H Nicole Kindred A, DO 125 mL/hr at 09/03/21 0113 100 mg at 09/03/21 0113   escitalopram (LEXAPRO) tablet 5 mg  5 mg Oral Daily Mansy, Jan A, MD   5 mg at 09/02/21 0857   feeding supplement (ENSURE ENLIVE / ENSURE PLUS) liquid 237 mL  237 mL Oral TID BM Nicole Kindred A, DO   237 mL at 09/02/21 2148   fluticasone (FLONASE) 50 MCG/ACT nasal spray 1 spray  1 spray Each Nare Daily PRN Mansy, Jan A, MD       furosemide (LASIX) injection 20 mg  20 mg Intravenous Q12H Mansy, Jan A, MD   20 mg at 09/03/21 0529   guaiFENesin (ROBITUSSIN) 100 MG/5ML liquid 5 mL  5 mL Oral Q4H PRN Nicole Kindred A, DO   5 mL at 09/03/21 0206   ipratropium (ATROVENT) nebulizer solution 0.5 mg  0.5 mg Nebulization TID Eugenie Filler, MD   0.5 mg at 09/03/21 0740   levalbuterol (XOPENEX) nebulizer solution 0.63 mg  0.63 mg Nebulization TID Eugenie Filler, MD   0.63 mg at 09/03/21 0740   loperamide (IMODIUM) capsule 2 mg  2 mg Oral PRN Eugenie Filler, MD       loratadine (CLARITIN) tablet 10 mg  10 mg Oral Daily Mansy, Jan A, MD   10 mg at 09/02/21 0851   magnesium hydroxide (MILK OF MAGNESIA) suspension 30 mL  30 mL Oral Daily PRN Mansy, Jan A, MD       metoprolol tartrate (LOPRESSOR) tablet 100 mg  100 mg Oral BID Callwood, Dwayne D, MD   100 mg at 09/02/21 2146   multivitamin with minerals tablet 1 tablet  1 tablet Oral Daily Nicole Kindred A, DO   1 tablet at 09/02/21 0851   nitroGLYCERIN (NITROSTAT) SL tablet 0.4 mg  0.4 mg Sublingual Q5 Min x  3 PRN Mansy, Jan A, MD       ondansetron (ZOFRAN) injection 4 mg  4 mg Intravenous Q6H PRN Callwood, Dwayne D, MD       pantoprazole (PROTONIX) EC tablet 40 mg  40 mg Oral Daily Mansy, Jan A, MD   40 mg at 09/02/21 0851   potassium chloride SA (KLOR-CON M) CR tablet 20 mEq  20 mEq Oral BID Eugenie Filler, MD   20  mEq at 09/02/21 2146   sodium chloride flush (NS) 0.9 % injection 3 mL  3 mL Intravenous Q12H Callwood, Dwayne D, MD   3 mL at 09/02/21 2149   sodium chloride flush (NS) 0.9 % injection 3 mL  3 mL Intravenous Q12H Callwood, Dwayne D, MD   3 mL at 09/02/21 2147   sodium chloride flush (NS) 0.9 % injection 3 mL  3 mL Intravenous PRN Callwood, Dwayne D, MD         Discharge Medications: Please see discharge summary for a list of discharge medications.  Relevant Imaging Results:  Relevant Lab Results:   Additional Information SSN:[id number]  Alberteen Sam, LCSW

## 2021-09-04 ENCOUNTER — Encounter: Payer: Self-pay | Admitting: Family Medicine

## 2021-09-04 DIAGNOSIS — I214 Non-ST elevation (NSTEMI) myocardial infarction: Secondary | ICD-10-CM | POA: Diagnosis not present

## 2021-09-04 LAB — CBC
HCT: 32.3 % — ABNORMAL LOW (ref 36.0–46.0)
Hemoglobin: 10.4 g/dL — ABNORMAL LOW (ref 12.0–15.0)
MCH: 30.6 pg (ref 26.0–34.0)
MCHC: 32.2 g/dL (ref 30.0–36.0)
MCV: 95 fL (ref 80.0–100.0)
Platelets: 316 10*3/uL (ref 150–400)
RBC: 3.4 MIL/uL — ABNORMAL LOW (ref 3.87–5.11)
RDW: 14 % (ref 11.5–15.5)
WBC: 8.5 10*3/uL (ref 4.0–10.5)
nRBC: 0 % (ref 0.0–0.2)

## 2021-09-04 LAB — BASIC METABOLIC PANEL
Anion gap: 5 (ref 5–15)
BUN: 25 mg/dL — ABNORMAL HIGH (ref 8–23)
CO2: 28 mmol/L (ref 22–32)
Calcium: 8.4 mg/dL — ABNORMAL LOW (ref 8.9–10.3)
Chloride: 103 mmol/L (ref 98–111)
Creatinine, Ser: 0.71 mg/dL (ref 0.44–1.00)
GFR, Estimated: >60 mL/min (ref >60)
Glucose, Bld: 109 mg/dL — ABNORMAL HIGH (ref 70–99)
Potassium: 3.7 mmol/L (ref 3.5–5.1)
Sodium: 136 mmol/L (ref 135–145)

## 2021-09-04 LAB — MAGNESIUM: Magnesium: 2.2 mg/dL (ref 1.7–2.4)

## 2021-09-04 MED ORDER — AMIODARONE HCL 200 MG PO TABS
200.0000 mg | ORAL_TABLET | Freq: Every day | ORAL | Status: DC
  Administered 2021-09-05 – 2021-09-06 (×2): 200 mg via ORAL
  Filled 2021-09-04 (×2): qty 1

## 2021-09-04 MED ORDER — DOXYCYCLINE HYCLATE 100 MG PO TABS
100.0000 mg | ORAL_TABLET | Freq: Two times a day (BID) | ORAL | Status: DC
Start: 2021-09-04 — End: 2021-09-05
  Administered 2021-09-04 – 2021-09-05 (×2): 100 mg via ORAL
  Filled 2021-09-04 (×2): qty 1

## 2021-09-04 NOTE — TOC Progression Note (Signed)
Transition of Care [facility]) - Progression Note    Patient Details  Name: Louanne Calvillo MRN: [id number] Date of Birth: 07/29/1927  Transition of Care [facility]) CM/SW [place], [place] Phone Number: 09/04/2021, 2:09 PM  Clinical Narrative:     CSW provided bed offers to patient at bedside, patient reports being in agreement with [employer].   Patient is not managed by Everlene Balls, CSW has reached out to [place] with [employer] to start patient's insurance authorization today.   Pending insurance auth at this time.     Expected Discharge Plan: [place] Barriers to Discharge: Continued Medical Work up  Expected Discharge Plan and Services Expected Discharge Plan: [place] arrangements for the past 2 months: Single Family Home                                       Social Determinants of Health (SDOH) Interventions    Readmission Risk Interventions No flowsheet data found.

## 2021-09-04 NOTE — Progress Notes (Signed)
PHARMACIST - PHYSICIAN COMMUNICATION DR:   Mal Misty CONCERNING: Antibiotic IV to Oral Route Change Policy  RECOMMENDATION: This patient is receiving Doxycycline by the intravenous route.  Based on criteria approved by the Pharmacy and Therapeutics Committee, the antibiotic(s) is/are being converted to the equivalent oral dose form(s).   DESCRIPTION: These criteria include: Patient being treated for a respiratory tract infection, urinary tract infection, cellulitis or clostridium difficile associated diarrhea if on metronidazole The patient is not neutropenic and does not exhibit a GI malabsorption state The patient is eating (either orally or via tube) and/or has been taking other orally administered medications for a least 24 hours The patient is improving clinically and has a Tmax < 100.5  If you have questions about this conversion, please contact the Pharmacy Department  []   [phone] )  [facility] []   [phone] )  [facility]ac[facility] Pontes  []   [phone] )  [facility] []   [phone] )  [place] Rodriguez-Guzman PharmD, BCPS 09/04/2021 9:32 AM

## 2021-09-04 NOTE — Progress Notes (Signed)
Physical Therapy Treatment Patient Details Name: [name] MRN: [id number] DOB: August 02, 1927 Today's Date: 09/04/2021   History of Present Illness 87 y.o. female with medical history significant for GERD, hypertension, osteoarthritis, non-STEMI, chronic diastolic CHF, and atrial fibrillation on Eliquis, coming with acute onset cough with associated dyspnea without chest pain or palpitations. Pt admitted for management of NSTEMI (troponin peaked 1554), Afib with RVR.  S/P cardiac cath with PCI to RCA (x2) via R femoral artery.    PT Comments    Pt received upright in bed finishing breathing treatment with respiratory. Agreeable to participate. Pt remains supervision for bed mobility and minguard for STS and stand to sit transfers. Limited in ambulating 25' today due to SOB and requests to use BSC to pass BM. Is indep with perihygiene in sitting but did requires cuing for sequencing RW and hand placement with sitting on BSC. Post BM pt able to amb with minguard to wash hands and return to sitting in recliner. SPO2 remains > 95% throughout mobility with HR trending from 96-114 BPM. All needs in reach and RN aware of pt location. STR remains appropriate to safely progress strength/endurance in standing and walking tolerance for ADL completion.     Recommendations for follow up therapy are one component of a multi-disciplinary discharge planning process, led by the attending physician.  Recommendations may be updated based on patient status, additional functional criteria and insurance authorization.  Follow Up Recommendations  Skilled nursing-short term rehab (<3 hours/day)     Assistance Recommended at Discharge Frequent or constant Supervision/Assistance  Equipment Recommendations  Rolling walker (2 wheels);BSC/3in1    Recommendations for Other Services       Precautions / Restrictions Precautions Precautions: Fall Restrictions Weight Bearing Restrictions: No     Mobility  Bed  Mobility Overal bed mobility: Needs Assistance Bed Mobility: Supine to Sit     Supine to sit: Supervision;HOB elevated     General bed mobility comments: In recliner post session Patient Response: Cooperative  Transfers Overall transfer level: Needs assistance Equipment used: Rolling walker (2 wheels) Transfers: Sit to/from Stand Sit to Stand: Min guard           General transfer comment: Safe use of hands from bed to stand to RW    Ambulation/Gait Ambulation/Gait assistance: Min guard Gait Distance (Feet): 35 Feet (25+10) Assistive device: Rolling walker (2 wheels) Gait Pattern/deviations: Step-through pattern;Trunk flexed           Stairs             Wheelchair Mobility    Modified Rankin (Stroke Patients Only)       Balance Overall balance assessment: Needs assistance Sitting-balance support: No upper extremity supported;Feet supported Sitting balance-Leahy Scale: Good Sitting balance - Comments: Good sitting balance during peri-care   Standing balance support: Bilateral upper extremity supported;During functional activity;Reliant on assistive device for balance Standing balance-Leahy Scale: Fair Standing balance comment: UE's on RW for support                            Cognition Arousal/Alertness: Awake/alert Behavior During Therapy: WFL for tasks assessed/performed Overall Cognitive Status: Within Functional Limits for tasks assessed                                          Exercises Other Exercises Other Exercises: Toilet transfer with cuing  for hand placement.    General Comments General comments (skin integrity, edema, etc.): Resting HR: 96-107 BPM in A-fib. With ambulation up to 114 BPM in a-fib.      Pertinent Vitals/Pain Faces Pain Scale: Hurts a little bit Pain Location: R hip/groin Pain Descriptors / Indicators: Aching;Grimacing;Guarding Pain Intervention(s): Limited activity within patient's  tolerance;Monitored during session    Home Living                          Prior Function            PT Goals (current goals can now be found in the care plan section) Acute Rehab PT Goals Patient Stated Goal: to return home PT Goal Formulation: With patient Time For Goal Achievement: 09/15/21 Potential to Achieve Goals: Good Progress towards PT goals: Progressing toward goals    Frequency    Min 2X/week      PT Plan Current plan remains appropriate    Co-evaluation              AM-PAC PT "6 Clicks" Mobility   Outcome Measure  Help needed turning from your back to your side while in a flat bed without using bedrails?: None Help needed moving from lying on your back to sitting on the side of a flat bed without using bedrails?: A Little Help needed moving to and from a bed to a chair (including a wheelchair)?: A Little Help needed standing up from a chair using your arms (e.g., wheelchair or bedside chair)?: A Little Help needed to walk in hospital room?: A Little Help needed climbing 3-5 steps with a railing? : A Lot 6 Click Score: 18    End of Session Equipment Utilized During Treatment: Gait belt Activity Tolerance: Patient tolerated treatment well Patient left: in chair;with call bell/phone within reach;with chair alarm set;with nursing/sitter in room Nurse Communication: Mobility status PT Visit Diagnosis: Muscle weakness (generalized) (M62.81);Difficulty in walking, not elsewhere classified (R26.2)     Time: 3300-7622 PT Time Calculation (min) (ACUTE ONLY): 47 min  Charges:  $Therapeutic Exercise: 23-37 mins                    Joran Kallal M. Fairly IV, PT, DPT Physical Therapist- [facility]  09/04/2021, 3:20 PM

## 2021-09-04 NOTE — Progress Notes (Incomplete)
[facility] Cardiology    SUBJECTIVE: ***   Vitals:   09/04/21 0400 09/04/21 0500 09/04/21 0757 09/04/21 1117  BP: 106/61  123/64 126/67  Pulse: 84  96 82  Resp: 20  18 17   Temp: 99.3 F (37.4 C)  98.1 F (36.7 C) 97.7 F (36.5 C)  TempSrc:      SpO2: 91%  90% 93%  Weight:  73.2 kg    Height:         Intake/Output Summary (Last 24 hours) at 09/04/2021 1222 Last data filed at 09/04/2021 1116 Gross per 24 hour  Intake 960 ml  Output 700 ml  Net 260 ml      PHYSICAL EXAM  General: Well developed, well nourished, in no acute distress HEENT:  Normocephalic and atramatic Neck:  No JVD.  Lungs: Clear bilaterally to auscultation and percussion. Heart: HRRR . Normal S1 and S2 without gallops or murmurs.  Abdomen: Bowel sounds are positive, abdomen soft and non-tender  Msk:  Back normal, normal gait. Normal strength and tone for age. Extremities: No clubbing, cyanosis or edema.   Neuro: Alert and oriented X 3. Psych:  Good affect, responds appropriately   LABS: Basic Metabolic Panel: Recent Labs    09/03/21 0456 09/04/21 0317  NA 139 136  K 4.4 3.7  CL 103 103  CO2 28 28  GLUCOSE 103* 109*  BUN 23 25*  CREATININE 0.97 0.71  CALCIUM 8.7* 8.4*  MG 1.9 2.2   Liver Function Tests: No results for input(s): AST, ALT, ALKPHOS, BILITOT, PROT, ALBUMIN in the last 72 hours. No results for input(s): LIPASE, AMYLASE in the last 72 hours. CBC: Recent Labs    09/03/21 0456 09/04/21 0317  WBC 9.5 8.5  HGB 10.8* 10.4*  HCT 34.6* 32.3*  MCV 97.5 95.0  PLT 292 316   Cardiac Enzymes: No results for input(s): CKTOTAL, CKMB, CKMBINDEX, TROPONINI in the last 72 hours. BNP: Invalid input(s): POCBNP D-Dimer: No results for input(s): DDIMER in the last 72 hours. Hemoglobin A1C: No results for input(s): HGBA1C in the last 72 hours. Fasting Lipid Panel: No results for input(s): CHOL, HDL, LDLCALC, TRIG, CHOLHDL, LDLDIRECT in the last 72 hours. Thyroid Function Tests: No  results for input(s): TSH, T4TOTAL, T3FREE, THYROIDAB in the last 72 hours.  Invalid input(s): FREET3 Anemia Panel: No results for input(s): VITAMINB12, FOLATE, FERRITIN, TIBC, IRON, RETICCTPCT in the last 72 hours.  No results found.   Echo ***  TELEMETRY: ***:  ASSESSMENT AND PLAN:  Principal Problem:   NSTEMI (non-ST elevated myocardial infarction) ([place]) Active Problems:   GERD (gastroesophageal reflux disease)   Acute respiratory failure with hypoxia ([place])   Community acquired pneumonia   Atrial fibrillation with rapid ventricular response (HCC)   Diarrhea   Depression   Hypokalemia   Acute systolic CHF (congestive heart failure) ([place])   Malnutrition of moderate degree    1. ***   Yolonda Kida, MD 09/04/2021 12:22 PM

## 2021-09-04 NOTE — Progress Notes (Signed)
Initial Nutrition Assessment  DOCUMENTATION CODES:   Non-severe (moderate) malnutrition in context of chronic illness  INTERVENTION:   -Continue Ensure Enlive po BID, each supplement provides 350 kcal and 20 grams of protein  -Continue MVI with minerals daily -Liberalize diet to 2 gram sodium  NUTRITION DIAGNOSIS:   Moderate Malnutrition related to chronic illness (CHF) as evidenced by mild fat depletion, moderate fat depletion, mild muscle depletion, moderate muscle depletion.  Ongoing  GOAL:   Patient will meet greater than or equal to 90% of their needs  Progressing   MONITOR:   PO intake, Supplement acceptance, Labs, Weight trends, Skin, I & O's  REASON FOR ASSESSMENT:   Malnutrition Screening Tool    ASSESSMENT:   87 yo female with a PMH of GERD, HTN, osteoarthritis, non-STEMI, chronic diastolic CHF, and A-fib presenting with acute onset cough with associated dyspnea. Admitted with STEMI.  Reviewed I/O's: +140 ml x 24 hours and -1.4 L since admission  UOP: 700 ml x 24 hours   Pt sleeping soundly at time of visit. She did not respond to name being called.   Pt with fair appetite. Noted meal completions 40-80%. Noted Ensure Enlive on tray table- pt consumed about 75% of supplements.   Plan to discharge to SNF once medically stable.   Medications reviewed and include calcium-vitamin D and lasix.   Labs reviewed.  Diet Order:   Diet Order             Diet Heart Room service appropriate? Yes with Assist; Fluid consistency: Thin  Diet effective now                   EDUCATION NEEDS:   Education needs have been addressed  Skin:  Skin Assessment: Reviewed RN Assessment  Last BM:  09/02/21  Height:   Ht Readings from Last 1 Encounters:  08/28/21 5' 5.5" (1.664 m)    Weight:   Wt Readings from Last 1 Encounters:  09/04/21 73.2 kg   BMI:  Body mass index is 26.45 kg/m.  Estimated Nutritional Needs:   Kcal:  0947-0962  Protein:  90-105  grams  Fluid:  >1.75 L    Loistine Chance, RD, LDN, [place] Registered Dietitian II Certified Diabetes Care and Education Specialist Please refer to [facility] for RD and/or RD on-call/weekend/after hours pager

## 2021-09-04 NOTE — Progress Notes (Signed)
Progress Note    [name]  TGG:[id number] DOB: May 20, 1927  DOA: 08/28/2021 PCP: Rusty Aus, MD      Brief Narrative:    Medical records reviewed and are as summarized below:  [name] is a 87 y.o. female significant for GERD, hypertension, osteoarthritis, non-STEMI, chronic diastolic CHF, and atrial fibrillation on Eliquis, who presented to the hospital with cough and shortness of breath.  She has chronic lower extremity edema.  She also complained of a 2-week history of diarrhea that occurred mostly after meals.  She had been prescribed Omnicef for suspected upper respiratory tract infection but she was not getting any better.  She was found to have acute NSTEMI, atrial fibrillation with RVR and acute on chronic diastolic CHF.  There was also concern for possible pneumonia versus bronchitis.    Assessment/Plan:   Principal Problem:   NSTEMI (non-ST elevated myocardial infarction) ([place]) Active Problems:   GERD (gastroesophageal reflux disease)   Acute respiratory failure with hypoxia ([place])   Community acquired pneumonia   Atrial fibrillation with rapid ventricular response (HCC)   Diarrhea   Depression   Hypokalemia   Acute systolic CHF (congestive heart failure) (HCC)   Malnutrition of moderate degree   Nutrition Problem: Moderate Malnutrition Etiology: chronic illness (CHF)  Signs/Symptoms: mild fat depletion, moderate fat depletion, mild muscle depletion, moderate muscle depletion   Body mass index is 26.45 kg/m.   Acute NSTEMI: Peak troponin was 1,554.  S/p left heart cath 08/30/2021 with successful PCI and stent of mid RCA and proximal RCA.  2D echo showed EF estimated at 55 to 60%.  Continue aspirin, Plavix and statin.  Acute systolic heart failure: Improved.  EF by cardiac cath was 35 to 40%.  Continue Lasix, metoprolol   Probable community-acquired pneumonia: Plan to complete doxycycline today.  Diarrhea: Improved  Awaiting placement to  SNF  Diet Order             Diet 2 gram sodium Room service appropriate? Yes; Fluid consistency: Thin  Diet effective now                      Consultants: Cardiologist  Procedures: Left heart cath with stent placement    Medications:    [START ON 09/05/2021] amiodarone  200 mg Oral Daily   apixaban  2.5 mg Oral BID   aspirin  81 mg Oral Daily   atorvastatin  80 mg Oral Daily   calcium-vitamin D  2 tablet Oral Q breakfast   clopidogrel  75 mg Oral Q breakfast   dextromethorphan-guaiFENesin  1 tablet Oral BID   doxycycline  100 mg Oral Q12H   escitalopram  5 mg Oral Daily   feeding supplement  237 mL Oral TID BM   furosemide  40 mg Oral BID   ipratropium  0.5 mg Nebulization TID   levalbuterol  0.63 mg Nebulization TID   loratadine  10 mg Oral Daily   melatonin  5 mg Oral QHS   metoprolol tartrate  100 mg Oral BID   multivitamin with minerals  1 tablet Oral Daily   pantoprazole  40 mg Oral Daily   potassium chloride SA  20 mEq Oral BID   sodium chloride flush  3 mL Intravenous Q12H   sodium chloride flush  3 mL Intravenous Q12H   Continuous Infusions:  sodium chloride 250 mL (09/03/21 0111)     Anti-infectives (From admission, onward)    Start  Dose/Rate Route Frequency Ordered Stop   09/04/21 2200  doxycycline (VIBRA-TABS) tablet 100 mg        100 mg Oral Every 12 hours 09/04/21 0935     09/02/21 1400  doxycycline (VIBRAMYCIN) 100 mg in sodium chloride 0.9 % 250 mL IVPB  Status:  Discontinued        100 mg 125 mL/hr over 120 Minutes Intravenous Every 12 hours 09/02/21 1214 09/04/21 0935   08/29/21 1230  cefTRIAXone (ROCEPHIN) 1 g in sodium chloride 0.9 % 100 mL IVPB        1 g 200 mL/hr over 30 Minutes Intravenous Every 24 hours 08/29/21 1227 09/01/21 1344   08/29/21 1230  azithromycin (ZITHROMAX) 500 mg in sodium chloride 0.9 % 250 mL IVPB  Status:  Discontinued        500 mg 250 mL/hr over 60 Minutes Intravenous Every 24 hours 08/29/21 1227  09/02/21 1213              Family Communication/Anticipated D/C date and plan/Code Status   DVT prophylaxis: apixaban (ELIQUIS) tablet 2.5 mg Start: 08/31/21 1115 apixaban (ELIQUIS) tablet 2.5 mg     Code Status: DNR  Family Communication: None Disposition Plan: Plan to discharge to SNF   Status is: Inpatient  Remains inpatient appropriate because: Awaiting placement to SNF           Subjective:   Interval events noted.  No chest pain or shortness of breath  Objective:    Vitals:   09/04/21 0400 09/04/21 0500 09/04/21 0757 09/04/21 1117  BP: 106/61  123/64 126/67  Pulse: 84  96 82  Resp: 20  18 17   Temp: 99.3 F (37.4 C)  98.1 F (36.7 C) 97.7 F (36.5 C)  TempSrc:      SpO2: 91%  90% 93%  Weight:  73.2 kg    Height:       No data found.   Intake/Output Summary (Last 24 hours) at 09/04/2021 1407 Last data filed at 09/04/2021 1358 Gross per 24 hour  Intake 1200 ml  Output 700 ml  Net 500 ml   Filed Weights   09/01/21 0446 09/03/21 0207 09/04/21 0500  Weight: 71.5 kg 73.4 kg 73.2 kg    Exam:  GEN: NAD SKIN: Bruising/ecchymosis on right upper thigh EYES: EOMI ENT: MMM CV: RRR PULM: CTA B ABD: soft, ND, NT, +BS CNS: AAO x 3, non focal EXT: No edema or tenderness        Data Reviewed:   I have personally reviewed following labs and imaging studies:  Labs: Labs show the following:   Basic Metabolic Panel: Recent Labs  Lab 08/31/21 0555 09/01/21 0611 09/02/21 0457 09/03/21 0456 09/04/21 0317  NA 135 137 140 139 136  K 3.8 4.2 4.5 4.4 3.7  CL 103 105 105 103 103  CO2 25 24 26 28 28   GLUCOSE 109* 118* 112* 103* 109*  BUN 19 19 22 23  25*  CREATININE 0.84 0.80 0.97 0.97 0.71  CALCIUM 8.5* 8.8* 8.8* 8.7* 8.4*  MG 1.8 1.7 2.2 1.9 2.2   GFR Estimated Creatinine Clearance: 45.5 mL/min (by C-G formula based on SCr of 0.71 mg/dL). Liver Function Tests: Recent Labs  Lab 08/28/21 2022 08/30/21 0503  AST 40 26  ALT 27  20  ALKPHOS 60 58  BILITOT 0.7 0.5  PROT 6.7 5.7*  ALBUMIN 3.6 2.7*   No results for input(s): LIPASE, AMYLASE in the last 168 hours. No results for input(s): AMMONIA in  the last 168 hours. Coagulation profile Recent Labs  Lab 08/28/21 2201  INR 1.4*    CBC: Recent Labs  Lab 08/28/21 2022 08/29/21 0606 08/31/21 0555 09/01/21 0611 09/02/21 0457 09/03/21 0456 09/04/21 0317  WBC 8.0   < > 7.6 8.0 9.2 9.5 8.5  NEUTROABS 5.5  --   --   --   --   --   --   HGB 13.4   < > 11.3* 11.1* 10.8* 10.8* 10.4*  HCT 41.5   < > 34.3* 34.3* 34.0* 34.6* 32.3*  MCV 95.0   < > 94.8 94.0 94.7 97.5 95.0  PLT 210   < > 235 246 284 292 316   < > = values in this interval not displayed.   Cardiac Enzymes: No results for input(s): CKTOTAL, CKMB, CKMBINDEX, TROPONINI in the last 168 hours. BNP (last 3 results) No results for input(s): PROBNP in the last 8760 hours. CBG: No results for input(s): GLUCAP in the last 168 hours. D-Dimer: No results for input(s): DDIMER in the last 72 hours. Hgb A1c: No results for input(s): HGBA1C in the last 72 hours. Lipid Profile: No results for input(s): CHOL, HDL, LDLCALC, TRIG, CHOLHDL, LDLDIRECT in the last 72 hours. Thyroid function studies: No results for input(s): TSH, T4TOTAL, T3FREE, THYROIDAB in the last 72 hours.  Invalid input(s): FREET3 Anemia work up: No results for input(s): VITAMINB12, FOLATE, FERRITIN, TIBC, IRON, RETICCTPCT in the last 72 hours. Sepsis Labs: Recent Labs  Lab 08/28/21 2201 08/28/21 2315 08/29/21 0606 09/01/21 0300 09/02/21 0457 09/03/21 0456 09/04/21 0317  PROCALCITON 0.14  --   --   --   --   --   --   WBC  --   --    < > 8.0 9.2 9.5 8.5  LATICACIDVEN  --  1.5  --   --   --   --   --    < > = values in this interval not displayed.    Microbiology Recent Results (from the past 240 hour(s))  Resp Panel by RT-PCR (Flu A&B, Covid) Nasopharyngeal Swab     Status: None   Collection Time: 08/28/21  8:22 PM    Specimen: Nasopharyngeal Swab; Nasopharyngeal(NP) swabs in vial transport medium  Result Value Ref Range Status   SARS Coronavirus 2 by RT PCR NEGATIVE NEGATIVE Final    Comment: (NOTE) SARS-CoV-2 target nucleic acids are NOT DETECTED.  The SARS-CoV-2 RNA is generally detectable in upper respiratory specimens during the acute phase of infection. The lowest concentration of SARS-CoV-2 viral copies this assay can detect is 138 copies/mL. A negative result does not preclude SARS-Cov-2 infection and should not be used as the sole basis for treatment or other patient management decisions. A negative result may occur with  improper specimen collection/handling, submission of specimen other than nasopharyngeal swab, presence of viral mutation(s) within the areas targeted by this assay, and inadequate number of viral copies(<138 copies/mL). A negative result must be combined with clinical observations, patient history, and epidemiological information. The expected result is Negative.  Fact Sheet for Patients:  EntrepreneurPulse.com.au  Fact Sheet for Healthcare Providers:  IncredibleEmployment.be  This test is no t yet approved or cleared by the [place] FDA and  has been authorized for detection and/or diagnosis of SARS-CoV-2 by FDA under an Emergency Use Authorization (EUA). This EUA will remain  in effect (meaning this test can be used) for the duration of the COVID-19 declaration under Section 564(b)(1) of the Act, 21 U.S.C.section  360bbb-3(b)(1), unless the authorization is terminated  or revoked sooner.       Influenza A by PCR NEGATIVE NEGATIVE Final   Influenza B by PCR NEGATIVE NEGATIVE Final    Comment: (NOTE) The Xpert Xpress SARS-CoV-2/FLU/RSV plus assay is intended as an aid in the diagnosis of influenza from Nasopharyngeal swab specimens and should not be used as a sole basis for treatment. Nasal washings and aspirates are  unacceptable for Xpert Xpress SARS-CoV-2/FLU/RSV testing.  Fact Sheet for Patients: EntrepreneurPulse.com.au  Fact Sheet for Healthcare Providers: IncredibleEmployment.be  This test is not yet approved or cleared by the [place] FDA and has been authorized for detection and/or diagnosis of SARS-CoV-2 by FDA under an Emergency Use Authorization (EUA). This EUA will remain in effect (meaning this test can be used) for the duration of the COVID-19 declaration under Section 564(b)(1) of the Act, 21 U.S.C. section 360bbb-3(b)(1), unless the authorization is terminated or revoked.  Performed at [facility], [place], [place] [address]   C Difficile Quick Screen w PCR reflex     Status: None   Collection Time: 08/29/21 11:34 PM   Specimen: STOOL  Result Value Ref Range Status   C Diff antigen NEGATIVE NEGATIVE Final   C Diff toxin NEGATIVE NEGATIVE Final   C Diff interpretation No C. difficile detected.  Final    Comment: Performed at [facility], [address] [place], [place] [address]  Gastrointestinal Panel by PCR , Stool     Status: None   Collection Time: 08/29/21 11:34 PM   Specimen: Stool  Result Value Ref Range Status   Campylobacter species NOT DETECTED NOT DETECTED Final   Plesimonas shigelloides NOT DETECTED NOT DETECTED Final   Salmonella species NOT DETECTED NOT DETECTED Final   Yersinia enterocolitica NOT DETECTED NOT DETECTED Final   Vibrio species NOT DETECTED NOT DETECTED Final   Vibrio cholerae NOT DETECTED NOT DETECTED Final   Enteroaggregative E coli (EAEC) NOT DETECTED NOT DETECTED Final   Enteropathogenic E coli (EPEC) NOT DETECTED NOT DETECTED Final   Enterotoxigenic E coli (ETEC) NOT DETECTED NOT DETECTED Final   Shiga like toxin producing E coli (STEC) NOT DETECTED NOT DETECTED Final   Shigella/Enteroinvasive E coli (EIEC) NOT DETECTED NOT DETECTED Final   Cryptosporidium NOT  DETECTED NOT DETECTED Final   Cyclospora cayetanensis NOT DETECTED NOT DETECTED Final   Entamoeba histolytica NOT DETECTED NOT DETECTED Final   Giardia lamblia NOT DETECTED NOT DETECTED Final   Adenovirus F40/41 NOT DETECTED NOT DETECTED Final   Astrovirus NOT DETECTED NOT DETECTED Final   Norovirus GI/GII NOT DETECTED NOT DETECTED Final   Rotavirus A NOT DETECTED NOT DETECTED Final   Sapovirus (I, II, IV, and V) NOT DETECTED NOT DETECTED Final    Comment: Performed at [facility], [address] [place], [place] [address]  Expectorated Sputum Assessment w Gram Stain, Rflx to Resp Cult     Status: None   Collection Time: 08/31/21  3:30 PM   Specimen: Sputum  Result Value Ref Range Status   Specimen Description SPUTUM  Final   Special Requests NONE  Final   Sputum evaluation   Final    THIS SPECIMEN IS ACCEPTABLE FOR SPUTUM CULTURE Performed at [facility], [address], [place], [place] [address]    Report Status 09/01/2021 FINAL  Final  Culture, Respiratory w Gram Stain     Status: None   Collection Time: 08/31/21  3:30 PM  Specimen: SPU  Result Value Ref Range Status   Specimen Description   Final    SPUTUM Performed at [facility], [address] [place], [place] [address]    Special Requests   Final    NONE Reflexed from [id number] Performed at [facility], [address] [place], [place] [address]    Gram Stain   Final    NO SQUAMOUS EPITHELIAL CELLS SEEN FEW WBC SEEN FEW GRAM POSITIVE RODS MODERATE GRAM POSITIVE COCCI    Culture   Final    RARE Normal respiratory flora-no Staph aureus or Pseudomonas seen Performed at [facility], [address] [place], [place] [address]    Report Status 09/03/2021 FINAL  Final    Procedures and diagnostic studies:  No results found.             LOS: 7 days   Dnya Hickle  [facility]   Pager on [email or web address]. If 7PM-7AM, please contact night-coverage  at [email or web address]     09/04/2021, 2:07 PM

## 2021-09-05 DIAGNOSIS — I214 Non-ST elevation (NSTEMI) myocardial infarction: Secondary | ICD-10-CM | POA: Diagnosis not present

## 2021-09-05 LAB — HEMOGLOBIN AND HEMATOCRIT, BLOOD
HCT: 32.5 % — ABNORMAL LOW (ref 36.0–46.0)
Hemoglobin: 10.1 g/dL — ABNORMAL LOW (ref 12.0–15.0)

## 2021-09-05 NOTE — Care Management Important Message (Signed)
Important Message  Patient Details  Name: [name] MRN: [id number] Date of Birth: 1927-07-15   Medicare Important Message Given:  Yes     Dannette Barbara 09/05/2021, 3:16 PM

## 2021-09-05 NOTE — Progress Notes (Addendum)
Mobility Specialist - Progress Note   09/05/21 1200  Mobility  Activity Transferred:  Chair to bed  Level of Assistance Standby assist, set-up cues, supervision of patient - no hands on  Assistive Device Front wheel walker  Distance Ambulated (ft) 3 ft  Mobility Ambulated with assistance in room  Mobility Response Tolerated well  Mobility performed by Mobility specialist  $Mobility charge 1 Mobility    Pt returned chair-bed. Still nauseous, but did resolve some. Alarm set.   Kathee Delton Mobility Specialist 09/05/21, 2:14 PM

## 2021-09-05 NOTE — Plan of Care (Signed)
Patient had one episode of vomiting. Given Zofran which relieved her nausea and vomiting.Given Trazodone. Slept well. No BM  Problem: Education: Goal: Knowledge of General Education information will improve Description: Including pain rating scale, medication(s)/side effects and non-pharmacologic comfort measures Outcome: Progressing   Problem: Health Behavior/Discharge Planning: Goal: Ability to manage health-related needs will improve Outcome: Progressing   Problem: Clinical Measurements: Goal: Ability to maintain clinical measurements within normal limits will improve Outcome: Progressing Goal: Will remain free from infection Outcome: Progressing Goal: Diagnostic test results will improve Outcome: Progressing Goal: Respiratory complications will improve Outcome: Progressing Goal: Cardiovascular complication will be avoided Outcome: Progressing

## 2021-09-05 NOTE — Progress Notes (Signed)
Progress Note    [name]  FOY:[id number] DOB: 08-21-1927  DOA: 08/28/2021 PCP: Rusty Aus, MD      Brief Narrative:    Medical records reviewed and are as summarized below:  [name] is a 87 y.o. female significant for GERD, hypertension, osteoarthritis, non-STEMI, chronic diastolic CHF, and atrial fibrillation on Eliquis, who presented to the hospital with cough and shortness of breath.  She has chronic lower extremity edema.  She also complained of a 2-week history of diarrhea that occurred mostly after meals.  She had been prescribed Omnicef for suspected upper respiratory tract infection but she was not getting any better.  She was found to have acute NSTEMI, atrial fibrillation with RVR and acute on chronic diastolic CHF.  There was also concern for possible pneumonia versus bronchitis.    Assessment/Plan:   Principal Problem:   NSTEMI (non-ST elevated myocardial infarction) ([place]) Active Problems:   GERD (gastroesophageal reflux disease)   Acute respiratory failure with hypoxia ([place])   Community acquired pneumonia   Atrial fibrillation with rapid ventricular response (H[facility])   Diarrhea   Depression   Hypokalemia   Acute systolic CHF (congestive heart failure) (HCC)   Malnutrition of moderate degree   Nutrition Problem: Moderate Malnutrition Etiology: chronic illness ([facility])  Signs/Symptoms: mild fat depletion, moderate fat depletion, mild muscle depletion, moderate muscle depletion   Body mass index is 26.45 kg/m.   Acute NSTEMI: Peak troponin was 1,554.  S/p left heart cath 08/30/2021 with successful PCI and stent of mid RCA and proximal RCA.  2D echo showed EF estimated at 55 to 60%.   Continue aspirin, Plavix and statin  Acute systolic heart failure: Improved.  EF by cardiac cath was 35 to 40%.  Continue Lasix, metoprolol  Probable community-acquired pneumonia: Completed 7 days of antibiotics.  Discontinue doxycycline  Diarrhea:  Improved  Awaiting placement to SNF  Diet Order             Diet 2 gram sodium Room service appropriate? Yes; Fluid consistency: Thin  Diet effective now                      Consultants: Cardiologist  Procedures: Left heart cath with stent placement    Medications:    amiodarone  200 mg Oral Daily   apixaban  2.5 mg Oral BID   aspirin  81 mg Oral Daily   atorvastatin  80 mg Oral Daily   calcium-vitamin D  2 tablet Oral Q breakfast   clopidogrel  75 mg Oral Q breakfast   dextromethorphan-guaiFENesin  1 tablet Oral BID   doxycycline  100 mg Oral Q12H   escitalopram  5 mg Oral Daily   feeding supplement  237 mL Oral TID BM   furosemide  40 mg Oral BID   ipratropium  0.5 mg Nebulization TID   levalbuterol  0.63 mg Nebulization TID   loratadine  10 mg Oral Daily   melatonin  5 mg Oral QHS   metoprolol tartrate  100 mg Oral BID   multivitamin with minerals  1 tablet Oral Daily   pantoprazole  40 mg Oral Daily   potassium chloride SA  20 mEq Oral BID   sodium chloride flush  3 mL Intravenous Q12H   sodium chloride flush  3 mL Intravenous Q12H   Continuous Infusions:  sodium chloride 250 mL (09/03/21 0111)     Anti-infectives (From admission, onward)    Start  Dose/Rate Route Frequency Ordered Stop   09/04/21 2200  doxycycline (VIBRA-TABS) tablet 100 mg        100 mg Oral Every 12 hours 09/04/21 0935     09/02/21 1400  doxycycline (VIBRAMYCIN) 100 mg in sodium chloride 0.9 % 250 mL IVPB  Status:  Discontinued        100 mg 125 mL/hr over 120 Minutes Intravenous Every 12 hours 09/02/21 1214 09/04/21 0935   08/29/21 1230  cefTRIAXone (ROCEPHIN) 1 g in sodium chloride 0.9 % 100 mL IVPB        1 g 200 mL/hr over 30 Minutes Intravenous Every 24 hours 08/29/21 1227 09/01/21 1344   08/29/21 1230  azithromycin (ZITHROMAX) 500 mg in sodium chloride 0.9 % 250 mL IVPB  Status:  Discontinued        500 mg 250 mL/hr over 60 Minutes Intravenous Every 24 hours  08/29/21 1227 09/02/21 1213              Family Communication/Anticipated D/C date and plan/Code Status   DVT prophylaxis: apixaban (ELIQUIS) tablet 2.5 mg Start: 08/31/21 1115 apixaban (ELIQUIS) tablet 2.5 mg     Code Status: DNR  Family Communication: None Disposition Plan: Plan to discharge to SNF   Status is: Inpatient  Remains inpatient appropriate because: Awaiting placement to SNF           Subjective:   C/o nausea. No chest pain or shortness of breath  Objective:    Vitals:   09/05/21 0500 09/05/21 0801 09/05/21 0952 09/05/21 1122  BP: (!) 94/52 108/85  96/64  Pulse: 89 93  87  Resp: 18     Temp: 97.6 F (36.4 C) (!) 97.4 F (36.3 C)  97.9 F (36.6 C)  TempSrc: Axillary   Oral  SpO2: (!) 89% 91% 95% 92%  Weight:      Height:       No data found.   Intake/Output Summary (Last 24 hours) at 09/05/2021 1145 Last data filed at 09/05/2021 0000 Gross per 24 hour  Intake 480 ml  Output 1000 ml  Net -520 ml   Filed Weights   09/01/21 0446 09/03/21 0207 09/04/21 0500  Weight: 71.5 kg 73.4 kg 73.2 kg    Exam:  GEN: NAD SKIN: Bruising and ecchymoses on right upper and posterior thigh EYES: No pallor or icterus ENT: MMM CV: RRR PULM: CTA B ABD: soft, ND, NT, +BS CNS: AAO x 3, non focal EXT: No edema or tenderness         Data Reviewed:   I have personally reviewed following labs and imaging studies:  Labs: Labs show the following:   Basic Metabolic Panel: Recent Labs  Lab 08/31/21 0555 09/01/21 0611 09/02/21 0457 09/03/21 0456 09/04/21 0317  NA 135 137 140 139 136  K 3.8 4.2 4.5 4.4 3.7  CL 103 105 105 103 103  CO2 25 24 26 28 28   GLUCOSE 109* 118* 112* 103* 109*  BUN 19 19 22 23  25*  CREATININE 0.84 0.80 0.97 0.97 0.71  CALCIUM 8.5* 8.8* 8.8* 8.7* 8.4*  MG 1.8 1.7 2.2 1.9 2.2   GFR Estimated Creatinine Clearance: 45.5 mL/min (by C-G formula based on SCr of 0.71 mg/dL). Liver Function Tests: Recent Labs   Lab 08/30/21 0503  AST 26  ALT 20  ALKPHOS 58  BILITOT 0.5  PROT 5.7*  ALBUMIN 2.7*   No results for input(s): LIPASE, AMYLASE in the last 168 hours. No results for input(s): AMMONIA in  the last 168 hours. Coagulation profile No results for input(s): INR, PROTIME in the last 168 hours.   CBC: Recent Labs  Lab 08/31/21 0555 09/01/21 0611 09/02/21 0457 09/03/21 0456 09/04/21 0317 09/05/21 0518  WBC 7.6 8.0 9.2 9.5 8.5  --   HGB 11.3* 11.1* 10.8* 10.8* 10.4* 10.1*  HCT 34.3* 34.3* 34.0* 34.6* 32.3* 32.5*  MCV 94.8 94.0 94.7 97.5 95.0  --   PLT 235 246 284 292 316  --    Cardiac Enzymes: No results for input(s): CKTOTAL, CKMB, CKMBINDEX, TROPONINI in the last 168 hours. BNP (last 3 results) No results for input(s): PROBNP in the last 8760 hours. CBG: No results for input(s): GLUCAP in the last 168 hours. D-Dimer: No results for input(s): DDIMER in the last 72 hours. Hgb A1c: No results for input(s): HGBA1C in the last 72 hours. Lipid Profile: No results for input(s): CHOL, HDL, LDLCALC, TRIG, CHOLHDL, LDLDIRECT in the last 72 hours. Thyroid function studies: No results for input(s): TSH, T4TOTAL, T3FREE, THYROIDAB in the last 72 hours.  Invalid input(s): FREET3 Anemia work up: No results for input(s): VITAMINB12, FOLATE, FERRITIN, TIBC, IRON, RETICCTPCT in the last 72 hours. Sepsis Labs: Recent Labs  Lab 09/01/21 0611 09/02/21 0457 09/03/21 0456 09/04/21 0317  WBC 8.0 9.2 9.5 8.5    Microbiology Recent Results (from the past 240 hour(s))  Resp Panel by RT-PCR (Flu A&B, Covid) Nasopharyngeal Swab     Status: None   Collection Time: 08/28/21  8:22 PM   Specimen: Nasopharyngeal Swab; Nasopharyngeal(NP) swabs in vial transport medium  Result Value Ref Range Status   SARS Coronavirus 2 by RT PCR NEGATIVE NEGATIVE Final    Comment: (NOTE) SARS-CoV-2 target nucleic acids are NOT DETECTED.  The SARS-CoV-2 RNA is generally detectable in upper  respiratory specimens during the acute phase of infection. The lowest concentration of SARS-CoV-2 viral copies this assay can detect is 138 copies/mL. A negative result does not preclude SARS-Cov-2 infection and should not be used as the sole basis for treatment or other patient management decisions. A negative result may occur with  improper specimen collection/handling, submission of specimen other than nasopharyngeal swab, presence of viral mutation(s) within the areas targeted by this assay, and inadequate number of viral copies(<138 copies/mL). A negative result must be combined with clinical observations, patient history, and epidemiological information. The expected result is Negative.  Fact Sheet for Patients:  EntrepreneurPulse.com.au  Fact Sheet for Healthcare Providers:  IncredibleEmployment.be  This test is no t yet approved or cleared by the [place] FDA and  has been authorized for detection and/or diagnosis of SARS-CoV-2 by FDA under an Emergency Use Authorization (EUA). This EUA will remain  in effect (meaning this test can be used) for the duration of the COVID-19 declaration under Section 564(b)(1) of the Act, 21 U.S.C.section 360bbb-3(b)(1), unless the authorization is terminated  or revoked sooner.       Influenza A by PCR NEGATIVE NEGATIVE Final   Influenza B by PCR NEGATIVE NEGATIVE Final    Comment: (NOTE) The Xpert Xpress SARS-CoV-2/FLU/RSV plus assay is intended as an aid in the diagnosis of influenza from Nasopharyngeal swab specimens and should not be used as a sole basis for treatment. Nasal washings and aspirates are unacceptable for Xpert Xpress SARS-CoV-2/FLU/RSV testing.  Fact Sheet for Patients: EntrepreneurPulse.com.au  Fact Sheet for Healthcare Providers: IncredibleEmployment.be  This test is not yet approved or cleared by the [place] FDA and has been  authorized for detection and/or diagnosis of  SARS-CoV-2 by FDA under an Emergency Use Authorization (EUA). This EUA will remain in effect (meaning this test can be used) for the duration of the COVID-19 declaration under Section 564(b)(1) of the Act, 21 U.S.C. section 360bbb-3(b)(1), unless the authorization is terminated or revoked.  Performed at [facility], [place], [place] [address]   C Difficile Quick Screen w PCR reflex     Status: None   Collection Time: 08/29/21 11:34 PM   Specimen: STOOL  Result Value Ref Range Status   C Diff antigen NEGATIVE NEGATIVE Final   C Diff toxin NEGATIVE NEGATIVE Final   C Diff interpretation No C. difficile detected.  Final    Comment: Performed at [facility], [address] [place], [place] [address]  Gastrointestinal Panel by PCR , Stool     Status: None   Collection Time: 08/29/21 11:34 PM   Specimen: Stool  Result Value Ref Range Status   Campylobacter species NOT DETECTED NOT DETECTED Final   Plesimonas shigelloides NOT DETECTED NOT DETECTED Final   Salmonella species NOT DETECTED NOT DETECTED Final   Yersinia enterocolitica NOT DETECTED NOT DETECTED Final   Vibrio species NOT DETECTED NOT DETECTED Final   Vibrio cholerae NOT DETECTED NOT DETECTED Final   Enteroaggregative E coli (EAEC) NOT DETECTED NOT DETECTED Final   Enteropathogenic E coli (EPEC) NOT DETECTED NOT DETECTED Final   Enterotoxigenic E coli (ETEC) NOT DETECTED NOT DETECTED Final   Shiga like toxin producing E coli (STEC) NOT DETECTED NOT DETECTED Final   Shigella/Enteroinvasive E coli (EIEC) NOT DETECTED NOT DETECTED Final   Cryptosporidium NOT DETECTED NOT DETECTED Final   Cyclospora cayetanensis NOT DETECTED NOT DETECTED Final   Entamoeba histolytica NOT DETECTED NOT DETECTED Final   Giardia lamblia NOT DETECTED NOT DETECTED Final   Adenovirus F40/41 NOT DETECTED NOT DETECTED Final   Astrovirus NOT DETECTED NOT DETECTED Final    Norovirus GI/GII NOT DETECTED NOT DETECTED Final   Rotavirus A NOT DETECTED NOT DETECTED Final   Sapovirus (I, II, IV, and V) NOT DETECTED NOT DETECTED Final    Comment: Performed at [facility], [address] [place], [place] [address]  Expectorated Sputum Assessment w Gram Stain, Rflx to Resp Cult     Status: None   Collection Time: 08/31/21  3:30 PM   Specimen: Sputum  Result Value Ref Range Status   Specimen Description SPUTUM  Final   Special Requests NONE  Final   Sputum evaluation   Final    THIS SPECIMEN IS ACCEPTABLE FOR SPUTUM CULTURE Performed at [facility], [address] [place], [place] [address]    Report Status 09/01/2021 FINAL  Final  Culture, Respiratory w Gram Stain     Status: None   Collection Time: 08/31/21  3:30 PM   Specimen: SPU  Result Value Ref Range Status   Specimen Description   Final    SPUTUM Performed at [facility], [address] [place], [place] [address]    Special Requests   Final    NONE Reflexed from [phone] Performed at [facility], [address] [place], [place] [address]    Gram Stain   Final    NO SQUAMOUS EPITHELIAL CELLS SEEN FEW WBC SEEN FEW GRAM POSITIVE RODS MODERATE GRAM POSITIVE COCCI    Culture   Final    RARE Normal respiratory flora-no Staph aureus or Pseudomonas seen Performed at [facility], [place]. [address] [place], [place]  [address]    Report Status 09/03/2021 FINAL  Final    Procedures and diagnostic studies:  No results found.             LOS: 8 days   Letticia Bhattacharyya  [facility]   Pager on [email or web address]. If 7PM-7AM, please contact night-coverage at [email or web address]     09/05/2021, 11:45 AM

## 2021-09-05 NOTE — Progress Notes (Signed)
Mobility Specialist - Progress Note   09/05/21 1130  Mobility  Activity Ambulated in hall  Level of Assistance Standby assist, set-up cues, supervision of patient - no hands on  Assistive Device Front wheel walker  Distance Ambulated (ft) 16 ft  Mobility Out of bed for toileting;Ambulated with assistance in room  Mobility Response Tolerated well  Mobility performed by Mobility specialist  $Mobility charge 1 Mobility    Pre-mobility: 86 HR During mobility: 97 HR, 93% SpO2 Post-mobility: 88 HR   Pt lying in bed upon arrival, utilizing RA. Initially without complaints then states she's feeling ill this date and reports nausea once EOB. Pt ambulated to [facility] for urinary output prior to transfer to recliner. No SOB. Pt left in chair with alarm set. RN notified.    Kathee Delton Mobility Specialist 09/05/21, 11:35 AM

## 2021-09-06 DIAGNOSIS — I5032 Chronic diastolic (congestive) heart failure: Secondary | ICD-10-CM | POA: Diagnosis not present

## 2021-09-06 DIAGNOSIS — E440 Moderate protein-calorie malnutrition: Secondary | ICD-10-CM | POA: Diagnosis not present

## 2021-09-06 DIAGNOSIS — R0602 Shortness of breath: Secondary | ICD-10-CM | POA: Diagnosis not present

## 2021-09-06 DIAGNOSIS — I4892 Unspecified atrial flutter: Secondary | ICD-10-CM | POA: Diagnosis not present

## 2021-09-06 DIAGNOSIS — I4891 Unspecified atrial fibrillation: Secondary | ICD-10-CM | POA: Diagnosis not present

## 2021-09-06 DIAGNOSIS — Z7901 Long term (current) use of anticoagulants: Secondary | ICD-10-CM | POA: Diagnosis not present

## 2021-09-06 DIAGNOSIS — E876 Hypokalemia: Secondary | ICD-10-CM | POA: Diagnosis not present

## 2021-09-06 DIAGNOSIS — R531 Weakness: Secondary | ICD-10-CM | POA: Diagnosis not present

## 2021-09-06 DIAGNOSIS — F331 Major depressive disorder, recurrent, moderate: Secondary | ICD-10-CM | POA: Diagnosis not present

## 2021-09-06 DIAGNOSIS — I110 Hypertensive heart disease with heart failure: Secondary | ICD-10-CM | POA: Diagnosis not present

## 2021-09-06 DIAGNOSIS — Z7401 Bed confinement status: Secondary | ICD-10-CM | POA: Diagnosis not present

## 2021-09-06 DIAGNOSIS — I10 Essential (primary) hypertension: Secondary | ICD-10-CM | POA: Diagnosis not present

## 2021-09-06 DIAGNOSIS — J471 Bronchiectasis with (acute) exacerbation: Secondary | ICD-10-CM | POA: Diagnosis not present

## 2021-09-06 DIAGNOSIS — J189 Pneumonia, unspecified organism: Secondary | ICD-10-CM | POA: Diagnosis not present

## 2021-09-06 DIAGNOSIS — J9601 Acute respiratory failure with hypoxia: Secondary | ICD-10-CM | POA: Diagnosis not present

## 2021-09-06 DIAGNOSIS — I214 Non-ST elevation (NSTEMI) myocardial infarction: Secondary | ICD-10-CM | POA: Diagnosis not present

## 2021-09-06 DIAGNOSIS — I959 Hypotension, unspecified: Secondary | ICD-10-CM | POA: Diagnosis not present

## 2021-09-06 DIAGNOSIS — I5033 Acute on chronic diastolic (congestive) heart failure: Secondary | ICD-10-CM | POA: Diagnosis not present

## 2021-09-06 DIAGNOSIS — R0902 Hypoxemia: Secondary | ICD-10-CM | POA: Diagnosis not present

## 2021-09-06 DIAGNOSIS — I25119 Atherosclerotic heart disease of native coronary artery with unspecified angina pectoris: Secondary | ICD-10-CM | POA: Diagnosis not present

## 2021-09-06 DIAGNOSIS — I5022 Chronic systolic (congestive) heart failure: Secondary | ICD-10-CM | POA: Diagnosis not present

## 2021-09-06 LAB — RESP PANEL BY RT-PCR (FLU A&B, COVID) ARPGX2
Influenza A by PCR: NEGATIVE
Influenza B by PCR: NEGATIVE
SARS Coronavirus 2 by RT PCR: NEGATIVE

## 2021-09-06 MED ORDER — ASPIRIN 81 MG PO CHEW: 81.0000 mg | CHEWABLE_TABLET | Freq: Every day | ORAL | Status: DC

## 2021-09-06 MED ORDER — ATORVASTATIN CALCIUM 80 MG PO TABS: 80.0000 mg | ORAL_TABLET | Freq: Every day | ORAL | Status: DC

## 2021-09-06 MED ORDER — CLOPIDOGREL BISULFATE 75 MG PO TABS: 75.0000 mg | ORAL_TABLET | Freq: Every day | ORAL | Status: DC

## 2021-09-06 MED ORDER — METOPROLOL TARTRATE 100 MG PO TABS: 100.0000 mg | ORAL_TABLET | Freq: Two times a day (BID) | ORAL | Status: DC

## 2021-09-06 MED ORDER — POTASSIUM CHLORIDE CRYS ER 20 MEQ PO TBCR: 20.0000 meq | EXTENDED_RELEASE_TABLET | Freq: Two times a day (BID) | ORAL | Status: DC

## 2021-09-06 MED ORDER — AMIODARONE HCL 200 MG PO TABS: 200.0000 mg | ORAL_TABLET | Freq: Every day | ORAL | Status: DC

## 2021-09-06 NOTE — Discharge Summary (Addendum)
Physician Discharge Summary  [name] ONG:[id number] DOB: 1927-04-11 DOA: 08/28/2021  PCP: Rusty Aus, MD  Admit date: 08/28/2021 Discharge date: 09/06/2021  Discharge disposition: SNF   Recommendations for Outpatient Follow-Up:   Follow-up with physician in the next home within 3 days of discharge. Follow-up with [facility] clinic cardiology group in 1 to 2 weeks (office will call to schedule)   Discharge Diagnosis:   Principal Problem:   NSTEMI (non-ST elevated myocardial infarction) [facility]) Active Problems:   GERD (gastroesophageal reflux disease)   Acute respiratory failure with hypoxia ([place])   Community acquired pneumonia   Atrial fibrillation with rapid ventricular response (HCC)   Diarrhea   Depression   Hypokalemia   Acute systolic CHF (congestive heart failure) (HCC)   Malnutrition of moderate degree    Discharge Condition: Stable.  Diet recommendation:  Diet Order             Diet - low sodium heart healthy           Diet 2 gram sodium Room service appropriate? Yes; Fluid consistency: Thin  Diet effective now                     Code Status: DNR     Hospital Course:   [name] is a 87 y.o. female significant for GERD, hypertension, osteoarthritis, non-STEMI, chronic diastolic CHF, and atrial fibrillation on Eliquis, who presented to the hospital with cough and shortness of breath.  She has chronic lower extremity edema.  She also complained of a 2-week history of diarrhea that occurred mostly after meals.  She had been prescribed Omnicef for suspected upper respiratory tract infection but she was not getting any better.   She was found to have acute NSTEMI, atrial fibrillation with RVR and acute systolic CHF.  There was also concern for possible pneumonia versus bronchitis.  She was treated with IV heparin, empiric IV antibiotics and IV Lasix.  She underwent left heart cath with successful PCI and stenting of mid RCA and proximal RCA.   Dual antiplatelet therapy with aspirin and Plavix was recommended by the cardiologist.  She was evaluated by PT and OT recommended further rehabilitation at a skilled nursing facility.  Her condition has improved and she is deemed stable for discharge to SNF today. Discharge plan discussed with [name], niece, over the phone.    Medical Consultants:   Cardiologist   Discharge Exam:    Vitals:   09/06/21 0000 09/06/21 0400 09/06/21 0833 09/06/21 1157  BP: 102/60 103/86 115/70 (!) 108/56  Pulse: 93 87 (!) 120 (!) 108  Resp:      Temp: 97.8 F (36.6 C) 98.1 F (36.7 C)    TempSrc: Axillary Oral    SpO2: 93% 91% 92% 91%  Weight:      Height:         GEN: NAD SKIN: Bruising/ecchymoses on the right upper posterior thigh EYES: No pallor or icterus ENT: MMM CV: Irregular heart rate and rhythm PULM: CTA B ABD: soft, ND, NT, +BS CNS: AAO x 3, non focal EXT: No edema or tenderness   The results of significant diagnostics from this hospitalization (including imaging, microbiology, ancillary and laboratory) are listed below for reference.     Procedures and Diagnostic Studies:   CARDIAC CATHETERIZATION  Result Date: 08/30/2021   Mid LM to Dist LM lesion is 35% stenosed.   Prox LAD lesion is 50% stenosed.   Prox RCA lesion is 95%  stenosed.   Mid RCA lesion is 75% stenosed.   A stent was successfully placed.   A stent was successfully placed.   Post intervention, there is a 0% residual stenosis.   Post intervention, there is a 0% residual stenosis.   There is mild to moderate left ventricular systolic dysfunction.   LV end diastolic pressure is mildly elevated.   The left ventricular ejection fraction is 35-45% by visual estimate.   There is no mitral valve regurgitation. Conclusion Diagnostic cardiac cath because of non-STEMI and heart failure Left ventriculogram shows anterior  hypokinesis Ejection fraction of around 35 to 40% ejection fraction of around 35-40 Coronaries Left  main distal 25% LAD large mid 50% diagonal 1 bifurcation Circumflex relatively free of disease RCA large proximal 95 mid 75% IRA vessel TIMI-3 flow in all vessels pre and postintervention Intervention Successful PCI and stent of mid RCA 2.25 mmx25mm Onyx  frontier Lesion reduced from 75 to 0% Successful PCI stent of proximal RCA lesion 2.25 x 15 mm Onyx frontier Reduce lesion from 95 down to 0% Placed on on Angiomax Plavix aspirin Minx deployed for hemostasis   ECHOCARDIOGRAM COMPLETE  Result Date: 08/30/2021    ECHOCARDIOGRAM REPORT   Patient Name:   [facility] Date of Exam: 08/29/2021 Medical Rec #:  [id number]  Height:       65.5 in Accession #:    [id number] Weight:       155.0 lb Date of Birth:  23-Oct-1926  BSA:          1.785 m Patient Age:    62 years   BP:           124/80 mmHg Patient Gender: F          HR:           140 bpm. Exam Location:  [facility] Procedure: 2D Echo, Cardiac Doppler and Color Doppler Indications:     NSTEMI I21.4  History:         Patient has prior history of Echocardiogram examinations, most                  recent 08/28/2020. Risk Factors:Hypertension.  Sonographer:     Sherrie Sport Referring Phys:  [id number] JAN A MANSY Diagnosing Phys: Yolonda Kida MD  Sonographer Comments: Suboptimal apical window. IMPRESSIONS  1. Anterior Hypo with Preserved LVF.  2. Left ventricular ejection fraction, by estimation, is 55 to 60%. The left ventricle has normal function. The left ventricle demonstrates regional wall motion abnormalities (see scoring diagram/findings for description). Left ventricular diastolic function could not be evaluated.  3. Right ventricular systolic function is normal. The right ventricular size is normal.  4. The mitral valve is normal in structure. No evidence of mitral valve regurgitation.  5. The aortic valve is normal in structure. Aortic valve regurgitation is not visualized. FINDINGS  Left Ventricle: Left ventricular ejection fraction, by estimation, is 55 to 60%. The  left ventricle has normal function. The left ventricle demonstrates regional wall motion abnormalities. The left ventricular internal cavity size was normal in size. There is no left ventricular hypertrophy. Left ventricular diastolic function could not be evaluated. Right Ventricle: The right ventricular size is normal. No increase in right ventricular wall thickness. Right ventricular systolic function is normal. Left Atrium: Left atrial size was normal in size. Right Atrium: Right atrial size was normal in size. Pericardium: There is no evidence of pericardial effusion. Mitral Valve: The mitral valve is normal in structure. No  evidence of mitral valve regurgitation. MV peak gradient, 6.7 mmHg. The mean mitral valve gradient is 3.0 mmHg. Tricuspid Valve: The tricuspid valve is normal in structure. Tricuspid valve regurgitation is not demonstrated. Aortic Valve: The aortic valve is normal in structure. Aortic valve regurgitation is not visualized. Aortic valve mean gradient measures 3.0 mmHg. Aortic valve peak gradient measures 4.2 mmHg. Aortic valve area, by VTI measures 2.92 cm. Pulmonic Valve: The pulmonic valve was normal in structure. Pulmonic valve regurgitation is not visualized. Aorta: The ascending aorta was not well visualized. IAS/Shunts: No atrial level shunt detected by color flow Doppler. Additional Comments: Anterior Hypo with Preserved LVF.  LEFT VENTRICLE PLAX 2D LVIDd:         4.40 cm LVIDs:         3.10 cm LV PW:         1.50 cm LV IVS:        1.00 cm LVOT diam:     2.00 cm LV SV:         42 LV SV Index:   24 LVOT Area:     3.14 cm  RIGHT VENTRICLE RV S prime:     11.20 cm/s TAPSE (M-mode): 2.8 cm LEFT ATRIUM             Index        RIGHT ATRIUM           Index LA diam:        3.90 cm 2.18 cm/m   RA Area:     16.50 cm LA Vol (A2C):   35.4 ml 19.83 ml/m  RA Volume:   38.60 ml  21.62 ml/m LA Vol (A4C):   55.8 ml 31.26 ml/m LA Biplane Vol: 47.0 ml 26.33 ml/m  AORTIC VALVE                     PULMONIC VALVE AV Area (Vmax):    2.75 cm     PV Vmax:        0.96 m/s AV Area (Vmean):   2.64 cm     PV Vmean:       67.600 cm/s AV Area (VTI):     2.92 cm     PV VTI:         0.131 m AV Vmax:           103.00 cm/s  PV Peak grad:   3.7 mmHg AV Vmean:          74.800 cm/s  PV Mean grad:   2.0 mmHg AV VTI:            0.144 m      RVOT Peak grad: 5 mmHg AV Peak Grad:      4.2 mmHg AV Mean Grad:      3.0 mmHg LVOT Vmax:         90.25 cm/s LVOT Vmean:        62.750 cm/s LVOT VTI:          0.134 m LVOT/AV VTI ratio: 0.93  AORTA Ao Root diam: 3.40 cm MITRAL VALVE               TRICUSPID VALVE MV Area (PHT): 4.74 cm    TR Peak grad:   35.8 mmHg MV Area VTI:   2.48 cm    TR Vmax:        299.00 cm/s MV Peak grad:  6.7 mmHg MV Mean grad:  3.0 mmHg  SHUNTS MV Vmax:       1.29 m/s    Systemic VTI:  0.13 m MV Vmean:      83.0 cm/s   Systemic Diam: 2.00 cm MV Decel Time: 160 msec    Pulmonic VTI:  0.174 m MV E velocity: 86.50 cm/s Yolonda Kida MD Electronically signed by Yolonda Kida MD Signature Date/Time: 08/30/2021/4:16:29 PM    Final      Labs:   Basic Metabolic Panel: Recent Labs  Lab 08/31/21 0555 09/01/21 0611 09/02/21 0457 09/03/21 0456 09/04/21 0317  NA 135 137 140 139 136  K 3.8 4.2 4.5 4.4 3.7  CL 103 105 105 103 103  CO2 25 24 26 28 28   GLUCOSE 109* 118* 112* 103* 109*  BUN 19 19 22 23  25*  CREATININE 0.84 0.80 0.97 0.97 0.71  CALCIUM 8.5* 8.8* 8.8* 8.7* 8.4*  MG 1.8 1.7 2.2 1.9 2.2   GFR Estimated Creatinine Clearance: 45.1 mL/min (by C-G formula based on SCr of 0.71 mg/dL). Liver Function Tests: No results for input(s): AST, ALT, ALKPHOS, BILITOT, PROT, ALBUMIN in the last 168 hours. No results for input(s): LIPASE, AMYLASE in the last 168 hours. No results for input(s): AMMONIA in the last 168 hours. Coagulation profile No results for input(s): INR, PROTIME in the last 168 hours.  CBC: Recent Labs  Lab 08/31/21 0555 09/01/21 0611 09/02/21 0457 09/03/21 0456  09/04/21 0317 09/05/21 0518  WBC 7.6 8.0 9.2 9.5 8.5  --   HGB 11.3* 11.1* 10.8* 10.8* 10.4* 10.1*  HCT 34.3* 34.3* 34.0* 34.6* 32.3* 32.5*  MCV 94.8 94.0 94.7 97.5 95.0  --   PLT 235 246 284 292 316  --    Cardiac Enzymes: No results for input(s): CKTOTAL, CKMB, CKMBINDEX, TROPONINI in the last 168 hours. BNP: Invalid input(s): POCBNP CBG: No results for input(s): GLUCAP in the last 168 hours. D-Dimer No results for input(s): DDIMER in the last 72 hours. Hgb A1c No results for input(s): HGBA1C in the last 72 hours. Lipid Profile No results for input(s): CHOL, HDL, LDLCALC, TRIG, CHOLHDL, LDLDIRECT in the last 72 hours. Thyroid function studies No results for input(s): TSH, T4TOTAL, T3FREE, THYROIDAB in the last 72 hours.  Invalid input(s): FREET3 Anemia work up No results for input(s): VITAMINB12, FOLATE, FERRITIN, TIBC, IRON, RETICCTPCT in the last 72 hours. Microbiology Recent Results (from the past 240 hour(s))  Resp Panel by RT-PCR (Flu A&B, Covid) Nasopharyngeal Swab     Status: None   Collection Time: 08/28/21  8:22 PM   Specimen: Nasopharyngeal Swab; Nasopharyngeal(NP) swabs in vial transport medium  Result Value Ref Range Status   SARS Coronavirus 2 by RT PCR NEGATIVE NEGATIVE Final    Comment: (NOTE) SARS-CoV-2 target nucleic acids are NOT DETECTED.  The SARS-CoV-2 RNA is generally detectable in upper respiratory specimens during the acute phase of infection. The lowest concentration of SARS-CoV-2 viral copies this assay can detect is 138 copies/mL. A negative result does not preclude SARS-Cov-2 infection and should not be used as the sole basis for treatment or other patient management decisions. A negative result may occur with  improper specimen collection/handling, submission of specimen other than nasopharyngeal swab, presence of viral mutation(s) within the areas targeted by this assay, and inadequate number of viral copies(<138 copies/mL). A negative  result must be combined with clinical observations, patient history, and epidemiological information. The expected result is Negative.  Fact Sheet for Patients:  EntrepreneurPulse.com.au  Fact Sheet for Healthcare Providers:  IncredibleEmployment.be  This test is no t yet approved or cleared by the [place] and  has been authorized for detection and/or diagnosis of SARS-CoV-2 by FDA under an Emergency Use Authorization (EUA). This EUA will remain  in effect (meaning this test can be used) for the duration of the COVID-19 declaration under Section 564(b)(1) of the Act, 21 U.S.C.section 360bbb-3(b)(1), unless the authorization is terminated  or revoked sooner.       Influenza A by PCR NEGATIVE NEGATIVE Final   Influenza B by PCR NEGATIVE NEGATIVE Final    Comment: (NOTE) The Xpert Xpress SARS-CoV-2/FLU/RSV plus assay is intended as an aid in the diagnosis of influenza from Nasopharyngeal swab specimens and should not be used as a sole basis for treatment. Nasal washings and aspirates are unacceptable for Xpert Xpress SARS-CoV-2/FLU/RSV testing.  Fact Sheet for Patients: EntrepreneurPulse.com.au  Fact Sheet for Healthcare Providers: IncredibleEmployment.be  This test is not yet approved or cleared by the [place] FDA and has been authorized for detection and/or diagnosis of SARS-CoV-2 by FDA under an Emergency Use Authorization (EUA). This EUA will remain in effect (meaning this test can be used) for the duration of the COVID-19 declaration under Section 564(b)(1) of the Act, 21 U.S.C. section 360bbb-3(b)(1), unless the authorization is terminated or revoked.  Performed at [facility], [place], [place] [address]   C Difficile Quick Screen w PCR reflex     Status: None   Collection Time: 08/29/21 11:34 PM   Specimen: STOOL  Result Value Ref Range Status   C Diff  antigen NEGATIVE NEGATIVE Final   C Diff toxin NEGATIVE NEGATIVE Final   C Diff interpretation No C. difficile detected.  Final    Comment: Performed at [facility], [address] [place], [place] [address]  Gastrointestinal Panel by PCR , Stool     Status: None   Collection Time: 08/29/21 11:34 PM   Specimen: Stool  Result Value Ref Range Status   Campylobacter species NOT DETECTED NOT DETECTED Final   Plesimonas shigelloides NOT DETECTED NOT DETECTED Final   Salmonella species NOT DETECTED NOT DETECTED Final   Yersinia enterocolitica NOT DETECTED NOT DETECTED Final   Vibrio species NOT DETECTED NOT DETECTED Final   Vibrio cholerae NOT DETECTED NOT DETECTED Final   Enteroaggregative E coli (EAEC) NOT DETECTED NOT DETECTED Final   Enteropathogenic E coli (EPEC) NOT DETECTED NOT DETECTED Final   Enterotoxigenic E coli (ETEC) NOT DETECTED NOT DETECTED Final   Shiga like toxin producing E coli (STEC) NOT DETECTED NOT DETECTED Final   Shigella/Enteroinvasive E coli (EIEC) NOT DETECTED NOT DETECTED Final   Cryptosporidium NOT DETECTED NOT DETECTED Final   Cyclospora cayetanensis NOT DETECTED NOT DETECTED Final   Entamoeba histolytica NOT DETECTED NOT DETECTED Final   Giardia lamblia NOT DETECTED NOT DETECTED Final   Adenovirus F40/41 NOT DETECTED NOT DETECTED Final   Astrovirus NOT DETECTED NOT DETECTED Final   Norovirus GI/GII NOT DETECTED NOT DETECTED Final   Rotavirus A NOT DETECTED NOT DETECTED Final   Sapovirus (I, II, IV, and V) NOT DETECTED NOT DETECTED Final    Comment: Performed at [facility], [address] [place], [place] [address]  Expectorated Sputum Assessment w Gram Stain, Rflx to Resp Cult     Status: None   Collection Time: 08/31/21  3:30 PM   Specimen: Sputum  Result Value Ref Range Status   Specimen Description SPUTUM  Final   Special Requests NONE  Final  Sputum evaluation   Final    THIS SPECIMEN IS ACCEPTABLE FOR SPUTUM  CULTURE Performed at [facility], [address] [place], [place] [address]    Report Status 09/01/2021 FINAL  Final  Culture, Respiratory w Gram Stain     Status: None   Collection Time: 08/31/21  3:30 PM   Specimen: SPU  Result Value Ref Range Status   Specimen Description   Final    SPUTUM Performed at [facility], [address], [place], [place] [address]    Special Requests   Final    NONE Reflexed from [id number] Performed at [facility], [address] [place], [place] [address]    Gram Stain   Final    NO SQUAMOUS EPITHELIAL CELLS SEEN FEW WBC SEEN FEW GRAM POSITIVE RODS MODERATE GRAM POSITIVE COCCI    Culture   Final    RARE Normal respiratory flora-no Staph aureus or Pseudomonas seen Performed at [facility] [address], [place], [place] [address]    Report Status 09/03/2021 FINAL  Final  Resp Panel by RT-PCR (Flu A&B, Covid) Nasopharyngeal Swab     Status: None   Collection Time: 09/06/21 10:45 AM   Specimen: Nasopharyngeal Swab; Nasopharyngeal(NP) swabs in vial transport medium  Result Value Ref Range Status   SARS Coronavirus 2 by RT PCR NEGATIVE NEGATIVE Final    Comment: (NOTE) SARS-CoV-2 target nucleic acids are NOT DETECTED.  The SARS-CoV-2 RNA is generally detectable in upper respiratory specimens during the acute phase of infection. The lowest concentration of SARS-CoV-2 viral copies this assay can detect is 138 copies/mL. A negative result does not preclude SARS-Cov-2 infection and should not be used as the sole basis for treatment or other patient management decisions. A negative result may occur with  improper specimen collection/handling, submission of specimen other than nasopharyngeal swab, presence of viral mutation(s) within the areas targeted by this assay, and inadequate number of viral copies(<138 copies/mL). A negative result must be combined with clinical observations, patient history, and  epidemiological information. The expected result is Negative.  Fact Sheet for Patients:  EntrepreneurPulse.com.au  Fact Sheet for Healthcare Providers:  IncredibleEmployment.be  This test is no t yet approved or cleared by the [place] FDA and  has been authorized for detection and/or diagnosis of SARS-CoV-2 by FDA under an Emergency Use Authorization (EUA). This EUA will remain  in effect (meaning this test can be used) for the duration of the COVID-19 declaration under Section 564(b)(1) of the Act, 21 U.S.C.section 360bbb-3(b)(1), unless the authorization is terminated  or revoked sooner.       Influenza A by PCR NEGATIVE NEGATIVE Final   Influenza B by PCR NEGATIVE NEGATIVE Final    Comment: (NOTE) The Xpert Xpress SARS-CoV-2/FLU/RSV plus assay is intended as an aid in the diagnosis of influenza from Nasopharyngeal swab specimens and should not be used as a sole basis for treatment. Nasal washings and aspirates are unacceptable for Xpert Xpress SARS-CoV-2/FLU/RSV testing.  Fact Sheet for Patients: EntrepreneurPulse.com.au  Fact Sheet for Healthcare Providers: IncredibleEmployment.be  This test is not yet approved or cleared by the [place] FDA and has been authorized for detection and/or diagnosis of SARS-CoV-2 by FDA under an Emergency Use Authorization (EUA). This EUA will remain in effect (meaning this test can be used) for the duration of the COVID-19 declaration under Section 564(b)(1) of the Act, 21 U.S.C. section 360bbb-3(b)(1), unless the authorization is terminated or revoked.  Performed at [facility], [address], [place], [place] [address]      Discharge Instructions:   Discharge Instructions     AMB Referral to Cardiac Rehabilitation - Phase II   Complete by: As directed    Diagnosis: Coronary Stents   After initial evaluation and assessments completed:  Virtual Based Care may be provided alone or in conjunction with Phase 2 Cardiac Rehab based on patient barriers.: Yes   Diet - low sodium heart healthy   Complete by: As directed    Increase activity slowly   Complete by: As directed       Allergies as of 09/06/2021       Reactions   Amoxicillin-pot Clavulanate Nausea Only   Cefprozil Nausea Only   Clarithromycin Nausea Only   Etodolac Diarrhea   Prednisone    Other reaction(s): Other (See Comments) Hyperglycemia   Pseudoephedrine    Other reaction(s): Unknown   Penicillins Rash   Sulfa Antibiotics Rash        Medication List     STOP taking these medications    cefdinir 300 MG capsule Commonly known as: OMNICEF   diltiazem 120 MG 24 hr capsule Commonly known as: CARDIZEM CD       TAKE these medications    amiodarone 200 MG tablet Commonly known as: PACERONE Take 1 tablet (200 mg total) by mouth daily. Start taking on: September 07, 2021   aspirin 81 MG chewable tablet Chew 1 tablet (81 mg total) by mouth daily. Start taking on: September 07, 2021   atorvastatin 80 MG tablet Commonly known as: LIPITOR Take 1 tablet (80 mg total) by mouth daily. Start taking on: September 07, 2021   Calcium Carbonate-Vitamin D 600-200 MG-UNIT Tabs Take 2 tablets by mouth daily.   carboxymethylcellul-glycerin 0.5-0.9 % ophthalmic solution Commonly known as: REFRESH OPTIVE Place 1 drop into both eyes 4 (four) times daily as needed for dry eyes.   cetirizine 10 MG tablet Commonly known as: ZYRTEC Take 10 mg by mouth at bedtime.   clopidogrel 75 MG tablet Commonly known as: PLAVIX Take 1 tablet (75 mg total) by mouth daily with breakfast. Start taking on: September 07, 2021   Eliquis 2.5 MG Tabs tablet Generic drug: apixaban Take 2.5 mg by mouth 2 (two) times daily.   escitalopram 5 MG tablet Commonly known as: LEXAPRO Take 5 mg by mouth in the morning and at bedtime.   fluticasone 50 MCG/ACT nasal spray Commonly  known as: FLONASE Place 1 spray into the nose daily as needed.   furosemide 20 MG tablet Commonly known as: LASIX Take 20 mg by mouth daily.   ipratropium-albuterol 0.5-2.5 (3) MG/3ML Soln Commonly known as: DUONEB Inhale 3 mLs into the lungs 3 (three) times daily.   metoprolol tartrate 100 MG tablet Commonly known as: LOPRESSOR Take 1 tablet (100 mg total) by mouth 2 (two) times daily. What changed:  medication strength how much to take Another medication with the same name was removed. Continue taking this medication, and follow the directions you see here.   omeprazole 20 MG capsule Commonly known as: PRILOSEC Take 20 mg by mouth daily.   potassium chloride SA 20 MEQ tablet Commonly known as: KLOR-CON M Take 1 tablet (20 mEq total) by mouth 2 (two) times daily. What changed: when to take this           If you experience worsening of your admission symptoms, develop shortness of breath, life threatening emergency, suicidal or homicidal thoughts you must  seek medical attention immediately by calling 911 or calling your MD immediately  if symptoms less severe.   You must read complete instructions/literature along with all the possible adverse reactions/side effects for all the medicines you take and that have been prescribed to you. Take any new medicines after you have completely understood and accept all the possible adverse reactions/side effects.    Please note   You were cared for by a hospitalist during your hospital stay. If you have any questions about your discharge medications or the care you received while you were in the hospital after you are discharged, you can call the unit and asked to speak with the hospitalist on call if the hospitalist that took care of you is not available. Once you are discharged, your primary care physician will handle any further medical issues. Please note that NO REFILLS for any discharge medications will be authorized once you are  discharged, as it is imperative that you return to your primary care physician (or establish a relationship with a primary care physician if you do not have one) for your aftercare needs so that they can reassess your need for medications and monitor your lab values.       Time coordinating discharge: 34 minutes  Signed:  Ruth Tully  [facility] 09/06/2021, 12:23 PM   Pager on [email or web address]. If 7PM-7AM, please contact night-coverage at [email or web address]

## 2021-09-06 NOTE — TOC Progression Note (Signed)
Transition of Care [facility]) - Progression Note    Patient Details  Name: [name] MRN: [id number] Date of Birth: 02-22-1927  Transition of Care [facility]) CM/SW [place], [place] Phone Number: 09/06/2021, 9:37 AM  Clinical Narrative:     CSW has reached out to [place] at [facility] for an update on [employer], pending response at this time.   Expected Discharge Plan: [place] Barriers to Discharge: Continued Medical Work up  Expected Discharge Plan and Services Expected Discharge Plan: [place] arrangements for the past 2 months: Single Family Home                                       Social Determinants of Health (SDOH) Interventions    Readmission Risk Interventions No flowsheet data found.

## 2021-09-06 NOTE — TOC Transition Note (Signed)
Transition of Care [facility]) - CM/SW Discharge Note   Patient Details  Name: [name] MRN: [id number] Date of Birth: Oct 07, 1926  Transition of Care [facility]) CM/SW Contact:  Alberteen Sam, LCSW Phone Number: 09/06/2021, 1:14 PM   Clinical Narrative:     Patient will DC to: [facility] Anticipated DC date: 09/06/21 Family notified: niece Glass blower/designer byJohnanna Schneiders  Per MD patient ready for DC to [facility] . RN, patient, patient's family, and facility notified of DC. Discharge Summary sent to facility. RN given number for report    [phone]. DC packet on chart. Ambulance transport requested for patient.  CSW signing off.  Pricilla Riffle, LCSW    Final next level of care: Skilled Nursing Facility Barriers to Discharge: No Barriers Identified   Patient Goals and CMS Choice Patient states their goals for this hospitalization and ongoing recovery are:: to go home CMS Medicare.gov Compare Post Acute Care list provided to:: Patient Choice offered to / list presented to : Patient  Discharge Placement              Patient chooses bed at: [facility] Patient to be transferred to facility by: [facility]   Patient and family notified of of transfer: 09/06/21  Discharge Plan and Services                                     Social Determinants of Health (SDOH) Interventions     Readmission Risk Interventions No flowsheet data found.

## 2021-09-06 NOTE — Progress Notes (Signed)
Occupational Therapy Treatment Patient Details Name: [name] MRN: [id number] DOB: [birth year] Today's Date: 09/06/2021   History of present illness 87 y.o. female with medical history significant for GERD, hypertension, osteoarthritis, non-STEMI, chronic diastolic CHF, and atrial fibrillation on Eliquis, coming with acute onset cough with associated dyspnea without chest pain or palpitations. Pt admitted for management of NSTEMI (troponin peaked 1554), Afib with RVR.  S/P cardiac cath with PCI to RCA (x2) via R femoral artery.   OT comments  Pt seen this date for OT tx session.  Pt in bed on arrival, pure wick in place however pt states she and the bed are both wet.  Removed pure wick and assisted patient with changing her clothing, min assist to manage telemetry box.  Pt required min guard and cues for bed mobility, min assist with transfer from bed to chair.  Pt able to complete grooming with set up.  Pt coughing quite a bit during session.  Pt continues to progress towards goals, she reports she may be going to short term rehab today.  Continue towards goals to maximize safety and independence with necessary daily tasks.      Recommendations for follow up therapy are one component of a multi-disciplinary discharge planning process, led by the attending physician.  Recommendations may be updated based on patient status, additional functional criteria and insurance authorization.    Follow Up Recommendations  Skilled nursing-short term rehab (<3 hours/day)    Assistance Recommended at Discharge Frequent or constant Supervision/Assistance  Equipment Recommendations  None recommended by OT    Recommendations for Other Services      Precautions / Restrictions Precautions Precautions: Fall Restrictions Weight Bearing Restrictions: No       Mobility Bed Mobility Overal bed mobility: Needs Assistance Bed Mobility: Supine to Sit     Supine to sit: Supervision;HOB elevated      General bed mobility comments: In recliner post session    Transfers Overall transfer level: Needs assistance Equipment used: Rolling walker (2 wheels) Transfers: Sit to/from Stand Sit to Stand: Min guard Stand pivot transfers: Min guard               Balance Overall balance assessment: Needs assistance Sitting-balance support: No upper extremity supported;Feet supported Sitting balance-Leahy Scale: Good Sitting balance - Comments: Good sitting balance during peri-care   Standing balance support: Bilateral upper extremity supported;During functional activity;Reliant on assistive device for balance Standing balance-Leahy Scale: Fair                             ADL either performed or assessed with clinical judgement   ADL Overall ADL's : Needs assistance/impaired     Grooming: Wash/dry hands;Supervision/safety;Set up;Sitting           Upper Body Dressing : Set up Upper Body Dressing Details (indicate cue type and reason): Assist with hospital gown to don and doff to manage telemetry box.                        Extremity/Trunk Assessment Upper Extremity Assessment Upper Extremity Assessment: Generalized weakness   Lower Extremity Assessment Lower Extremity Assessment: Defer to PT evaluation        Vision       Perception     Praxis      Cognition Arousal/Alertness: Awake/alert Behavior During Therapy: WFL for tasks assessed/performed Overall Cognitive Status: Within Functional Limits for tasks assessed  General Comments: A&Ox4. Pleasant and agreeable throughout          Exercises     Shoulder Instructions       General Comments      Pertinent Vitals/ Pain       Pain Assessment: No/denies pain Pain Score: 0-No pain  Home Living                                          Prior Functioning/Environment              Frequency  Min 3X/week         Progress Toward Goals  OT Goals(current goals can now be found in the care plan section)  Progress towards OT goals: Progressing toward goals  Acute Rehab OT Goals OT Goal Formulation: With patient Time For Goal Achievement: 09/15/21 Potential to Achieve Goals: Good  Plan Discharge plan remains appropriate;Frequency remains appropriate    Co-evaluation                 AM-PAC OT "6 Clicks" Daily Activity     Outcome Measure   Help from another person eating meals?: None Help from another person taking care of personal grooming?: A Little Help from another person toileting, which includes using toliet, bedpan, or urinal?: A Little Help from another person bathing (including washing, rinsing, drying)?: A Little Help from another person to put on and taking off regular upper body clothing?: A Little Help from another person to put on and taking off regular lower body clothing?: A Little 6 Click Score: 19    End of Session Equipment Utilized During Treatment: Rolling walker (2 wheels)  OT Visit Diagnosis: Muscle weakness (generalized) (M62.81)   Activity Tolerance Patient tolerated treatment well   Patient Left in chair;with call bell/phone within reach;with chair alarm set   Nurse Communication Mobility status;Other (comment)        Time: 4034-7425 OT Time Calculation (min): 29 min  Charges: OT General Charges $OT Visit: 1 Visit OT Treatments $Self Care/Home Management : 23-37 mins [name] [name], OTR/L, CLT   Cassiopeia Florentino 09/06/2021, 12:02 PM

## 2021-09-09 DIAGNOSIS — J471 Bronchiectasis with (acute) exacerbation: Secondary | ICD-10-CM | POA: Diagnosis not present

## 2021-09-09 DIAGNOSIS — I25119 Atherosclerotic heart disease of native coronary artery with unspecified angina pectoris: Secondary | ICD-10-CM | POA: Diagnosis not present

## 2021-09-09 DIAGNOSIS — I5032 Chronic diastolic (congestive) heart failure: Secondary | ICD-10-CM | POA: Diagnosis not present

## 2021-09-09 DIAGNOSIS — I4892 Unspecified atrial flutter: Secondary | ICD-10-CM | POA: Diagnosis not present

## 2021-09-09 DIAGNOSIS — F331 Major depressive disorder, recurrent, moderate: Secondary | ICD-10-CM | POA: Diagnosis not present

## 2021-09-24 NOTE — Progress Notes (Deleted)
°   Patient ID: [name], female    DOB: 1927/01/31, 87 y.o.   MRN: [id number]  HPI  [name] is a 87 y/o female with a history of  Echo report from 08/29/21 reviewed and showed an EF of 55-60% without structural changes.   LHC done 08/30/21 showed:   Mid LM to Dist LM lesion is 35% stenosed.   Prox LAD lesion is 50% stenosed.   Prox RCA lesion is 95% stenosed.   Mid RCA lesion is 75% stenosed.   A stent was successfully placed.   A stent was successfully placed.   Post intervention, there is a 0% residual stenosis.   Post intervention, there is a 0% residual stenosis.   There is mild to moderate left ventricular systolic dysfunction.   LV end diastolic pressure is mildly elevated.   The left ventricular ejection fraction is 35-45% by visual estimate.   There is no mitral valve regurgitation.   Conclusion Diagnostic cardiac cath because of non-STEMI and heart failure Left ventriculogram shows anterior  hypokinesis Ejection fraction of around 35 to 40% ejection fraction of around 35-40  Admitted 08/28/21 due to shortness of breath due to NSTEMI, AF RVR and acute on chronic HF. Given IV heparin, furosemide and empiric antibiotics.   Cardiology consult obtained. Underwent cath with subsequent PCI/stent. PT/OT evaluations done. Discharged after 9 days.   She presents today for her initial visit with a chief complaint of   Review of Systems    Physical Exam  Assessment & Plan:  1: Chronic heart failure with reduced ejection fraction (by cath)- - NYHA class - BNP 08/28/21 was 1095.0  2: HTN- - BP - saw PCP Sabra Heck) 03/27/21; currently seeing PCP at SNF - BMP 09/04/21 reviewed and showed sodium 136, potassium 3.7, creatinine 0.71 and GFR >60  3: Atrial fibrillation- - saw cardiology Petra Kuba) 06/13/21

## 2021-09-26 ENCOUNTER — Ambulatory Visit: Payer: Medicare HMO | Attending: Family | Admitting: Family

## 2021-09-26 ENCOUNTER — Encounter: Payer: Self-pay | Admitting: Family

## 2021-09-26 ENCOUNTER — Other Ambulatory Visit: Payer: Self-pay

## 2021-09-26 ENCOUNTER — Ambulatory Visit: Payer: Medicare HMO | Admitting: Family

## 2021-09-26 VITALS — BP 128/60 | HR 96 | Resp 20 | Ht 65.0 in | Wt 162.0 lb

## 2021-09-26 DIAGNOSIS — R252 Cramp and spasm: Secondary | ICD-10-CM | POA: Insufficient documentation

## 2021-09-26 DIAGNOSIS — K219 Gastro-esophageal reflux disease without esophagitis: Secondary | ICD-10-CM | POA: Insufficient documentation

## 2021-09-26 DIAGNOSIS — I4891 Unspecified atrial fibrillation: Secondary | ICD-10-CM | POA: Insufficient documentation

## 2021-09-26 DIAGNOSIS — I252 Old myocardial infarction: Secondary | ICD-10-CM | POA: Diagnosis not present

## 2021-09-26 DIAGNOSIS — I110 Hypertensive heart disease with heart failure: Secondary | ICD-10-CM | POA: Diagnosis not present

## 2021-09-26 DIAGNOSIS — I890 Lymphedema, not elsewhere classified: Secondary | ICD-10-CM

## 2021-09-26 DIAGNOSIS — I5022 Chronic systolic (congestive) heart failure: Secondary | ICD-10-CM | POA: Diagnosis not present

## 2021-09-26 DIAGNOSIS — I480 Paroxysmal atrial fibrillation: Secondary | ICD-10-CM

## 2021-09-26 DIAGNOSIS — I10 Essential (primary) hypertension: Secondary | ICD-10-CM

## 2021-09-26 NOTE — Progress Notes (Signed)
Patient ID: [name], female    DOB: 1926/10/19, 87 y.o.   MRN: [id number]  HPI  [name] is a 87 y/o female with a history of HTN, esophageal spasm, GERD, atrial fibrillation and chronic heart failure.   Echo report from 08/29/21 reviewed and showed an EF of 55-60% without structural changes.   LHC done 08/30/21 showed:   Mid LM to Dist LM lesion is 35% stenosed.   Prox LAD lesion is 50% stenosed.   Prox RCA lesion is 95% stenosed.   Mid RCA lesion is 75% stenosed.   A stent was successfully placed.   A stent was successfully placed.   Post intervention, there is a 0% residual stenosis.   Post intervention, there is a 0% residual stenosis.   There is mild to moderate left ventricular systolic dysfunction.   LV end diastolic pressure is mildly elevated.   The left ventricular ejection fraction is 35-45% by visual estimate.   There is no mitral valve regurgitation. Conclusion Diagnostic cardiac cath because of non-STEMI and heart failure Left ventriculogram shows anterior  hypokinesis Ejection fraction of around 35 to 40% ejection fraction of around 35-40  Admitted 08/28/21 due to shortness of breath due to NSTEMI, AF RVR and acute on chronic HF. Given IV heparin, furosemide and empiric antibiotics. Cardiology consult obtained. Underwent cath with subsequent PCI/stent. PT/OT evaluations done. Discharged after 9 days.   She presents today for her initial visit with a chief complaint of moderate fatigue with little exertion. She describes this as chronic in nature having been present for several years. She has associated decreased appetite, pedal edema, palpitations and abdominal distention along with this. She denies any difficulty sleeping, dizziness, cough, shortness of breath or chest pain.   Past Medical History:  Diagnosis Date   Allergic rhinitis    Arrhythmia    atrial fibrillation   Arthritis    CHF (congestive heart failure) (HCC)    Colon polyp    Edema    Esophageal  spasm    GERD (gastroesophageal reflux disease)    Hiatal hernia    Hypertension    Past Surgical History:  Procedure Laterality Date   APPENDECTOMY     CATARACT EXTRACTION     CORONARY STENT INTERVENTION N/A 08/30/2021   Procedure: CORONARY STENT INTERVENTION;  Surgeon: Yolonda Kida, MD;  Location: [place] [facility];  Service: Cardiovascular;  Laterality: N/A;   dilatation and curatage     EYE SURGERY     HYSTEROTOMY     LEFT HEART CATH AND CORONARY ANGIOGRAPHY N/A 08/30/2021   Procedure: LEFT HEART CATH AND CORONARY ANGIOGRAPHY possible PCI and stent;  Surgeon: Yolonda Kida, MD;  Location: [place] [facility];  Service: Cardiovascular;  Laterality: N/A;   OOPHORECTOMY     TONSILLECTOMY     Family History  Problem Relation Age of Onset   Dementia Mother    Heart attack Father    Social History   Tobacco Use   Smoking status: Never   Smokeless tobacco: Never  Substance Use Topics   Alcohol use: Never   Allergies  Allergen Reactions   Amoxicillin-Pot Clavulanate Nausea Only   Cefprozil Nausea Only   Clarithromycin Nausea Only   Etodolac Diarrhea   Prednisone     Other reaction(s): Other (See Comments) Hyperglycemia   Pseudoephedrine     Other reaction(s): Unknown   Penicillins Rash   Sulfa Antibiotics Rash   Prior to Admission medications   Medication Sig Start  Date End Date Taking? Authorizing Provider  amiodarone (PACERONE) 200 MG tablet Take 1 tablet (200 mg total) by mouth daily. 09/07/21  Yes Jennye Boroughs, MD  aspirin 81 MG chewable tablet Chew 1 tablet (81 mg total) by mouth daily. 09/07/21  Yes Jennye Boroughs, MD  atorvastatin (LIPITOR) 80 MG tablet Take 1 tablet (80 mg total) by mouth daily. 09/07/21  Yes Jennye Boroughs, MD  benzonatate (TESSALON) 100 MG capsule Take by mouth 3 (three) times daily as needed for cough.   Yes [provider]  Calcium Carbonate-Vitamin D 600-200 MG-UNIT TABS Take 2 tablets by mouth daily.    Yes  [provider]  carboxymethylcellul-glycerin (REFRESH OPTIVE) 0.5-0.9 % ophthalmic solution Place 1 drop into both eyes 4 (four) times daily as needed for dry eyes.   Yes [provider]  cetirizine (ZYRTEC) 10 MG tablet Take 10 mg by mouth at bedtime.   Yes [provider]  clopidogrel (PLAVIX) 75 MG tablet Take 1 tablet (75 mg total) by mouth daily with breakfast. 09/07/21  Yes Jennye Boroughs, MD  ELIQUIS 2.5 MG TABS tablet Take 2.5 mg by mouth 2 (two) times daily. 07/18/21  Yes [provider]  escitalopram (LEXAPRO) 5 MG tablet Take 5 mg by mouth in the morning and at bedtime.  02/02/18  Yes [provider]  fluticasone-salmeterol (ADVAIR) 100-50 MCG/ACT AEPB Inhale 1 puff into the lungs 2 (two) times daily.   Yes [provider]  furosemide (LASIX) 20 MG tablet Take 20 mg by mouth daily.  08/20/20  Yes [provider]  ipratropium-albuterol (DUONEB) 0.5-2.5 (3) MG/3ML SOLN Inhale 3 mLs into the lungs 3 (three) times daily. 07/06/20  Yes [provider]  metoprolol tartrate (LOPRESSOR) 100 MG tablet Take 1 tablet (100 mg total) by mouth 2 (two) times daily. 09/06/21  Yes Jennye Boroughs, MD  pantoprazole (PROTONIX) 40 MG tablet Take 40 mg by mouth daily.   Yes [provider]  potassium chloride SA (KLOR-CON M) 20 MEQ tablet Take 1 tablet (20 mEq total) by mouth 2 (two) times daily. 09/06/21  Yes Jennye Boroughs, MD  tiotropium (SPIRIVA) 18 MCG inhalation capsule Place 18 mcg into inhaler and inhale daily.   Yes [provider]  traMADol (ULTRAM) 50 MG tablet Take by mouth every 6 (six) hours as needed. Has not taken since 12/17 per pt's family member   Yes [provider]   Review of Systems  Constitutional:  Positive for appetite change (decreased) and fatigue (easily).  HENT:  Negative for congestion, postnasal drip and sore throat.   Eyes: Negative.   Respiratory:  Negative for cough, chest  tightness and shortness of breath.   Cardiovascular:  Positive for palpitations (at times) and leg swelling. Negative for chest pain.  Gastrointestinal:  Positive for abdominal distention (on occasion). Negative for abdominal pain.  Endocrine: Negative.   Genitourinary: Negative.   Skin: Negative.   Allergic/Immunologic: Negative.   Neurological:  Negative for dizziness and light-headedness.  Hematological:  Negative for adenopathy. Bruises/bleeds easily (right lower leg from cath).  Psychiatric/Behavioral:  Negative for dysphoric mood and sleep disturbance (sleeping on 2 pillows). The patient is not nervous/anxious.    Vitals:   09/26/21 1446  BP: 128/60  Pulse: 96  Resp: 20  SpO2: 97%  Weight: 162 lb (73.5 kg)  Height: 5\' 5"  (1.651 m)   Wt Readings from Last 3 Encounters:  09/26/21 162 lb (73.5 kg)  09/05/21 158 lb (71.7 kg)  09/03/20 156 lb  9.6 oz (71 kg)   Lab Results  Component Value Date   CREATININE 0.71 09/04/2021   CREATININE 0.97 09/03/2021   CREATININE 0.97 09/02/2021   Physical Exam Vitals and nursing note reviewed. Exam conducted with a chaperone present (niece).  Constitutional:      Appearance: Normal appearance.  HENT:     Head: Normocephalic and atraumatic.  Cardiovascular:     Rate and Rhythm: Normal rate. Rhythm irregular.  Pulmonary:     Effort: Pulmonary effort is normal. No respiratory distress.     Breath sounds: No wheezing or rales.  Abdominal:     General: There is no distension.     Palpations: Abdomen is soft.  Musculoskeletal:        General: No tenderness.     Cervical back: Normal range of motion and neck supple.     Right lower leg: Edema (2+ pitting) present.     Left lower leg: Edema (2+ pitting) present.  Skin:    General: Skin is warm and dry.     Findings: Bruising (right lower leg) present.  Neurological:     General: No focal deficit present.     Mental Status: She is alert and oriented to person, place, and time.   Psychiatric:        Mood and Affect: Mood normal.        Behavior: Behavior normal.        Thought Content: Thought content normal.   Assessment & Plan:  1: Chronic heart failure with reduced ejection fraction (by cath)- - NYHA class III - euvolemic today - weighing daily; reminded to call for an overnight weight gain of > 2 pounds or a weekly weight gain of > 5 pounds - not adding salt and has been trying to follow a low sodium diet; low sodium cookbook provided - on GDMT of metoprolol (although tartrate) - she and her niece admit to confusion regarding her meds from hospital discharge and then recent d/c from [facility] - reviewed starting other GDMT at next visit once her med confusion has cleared - BNP 08/28/21 was 1095.0  2: HTN- - BP looks good (128/60) - saw PCP Sabra Heck) 03/27/21; currently seeing PCP at SNF - BMP 09/04/21 reviewed and showed sodium 136, potassium 3.7, creatinine 0.71 and GFR >60  3: Atrial fibrillation- - saw cardiology Petra Kuba) 06/13/21  - HR irregular today  4: Lymphedema- - stage 2 - wearing compression socks but edema persists - encouraged to elevate legs when sitting for long periods of time - limited in her ability to exercise due to her symptoms - consider lymphapress compression boots if edema persists and after her leg bruising has resolved   Medication list reviewed.   Return in 6 weeks or sooner for any questions/problems before then.

## 2021-09-26 NOTE — Patient Instructions (Signed)
Continue weighing daily and call for an overnight weight gain of 3 pounds or more or a weekly weight gain of more than 5 pounds.  °

## 2021-10-01 DIAGNOSIS — I739 Peripheral vascular disease, unspecified: Secondary | ICD-10-CM | POA: Diagnosis not present

## 2021-10-01 DIAGNOSIS — I2510 Atherosclerotic heart disease of native coronary artery without angina pectoris: Secondary | ICD-10-CM | POA: Diagnosis not present

## 2021-10-01 DIAGNOSIS — J439 Emphysema, unspecified: Secondary | ICD-10-CM | POA: Diagnosis not present

## 2021-10-01 DIAGNOSIS — I25119 Atherosclerotic heart disease of native coronary artery with unspecified angina pectoris: Secondary | ICD-10-CM | POA: Diagnosis not present

## 2021-10-01 DIAGNOSIS — I4891 Unspecified atrial fibrillation: Secondary | ICD-10-CM | POA: Diagnosis not present

## 2021-10-01 DIAGNOSIS — I4892 Unspecified atrial flutter: Secondary | ICD-10-CM | POA: Diagnosis not present

## 2021-10-01 DIAGNOSIS — I482 Chronic atrial fibrillation, unspecified: Secondary | ICD-10-CM | POA: Diagnosis not present

## 2021-10-01 DIAGNOSIS — I5020 Unspecified systolic (congestive) heart failure: Secondary | ICD-10-CM | POA: Diagnosis not present

## 2021-10-02 ENCOUNTER — Other Ambulatory Visit: Payer: Self-pay | Admitting: Family

## 2021-10-02 MED ORDER — FUROSEMIDE 20 MG PO TABS: 20.0000 mg | ORAL_TABLET | Freq: Every day | ORAL | 5 refills | Status: DC

## 2021-10-02 NOTE — Progress Notes (Signed)
Furosemide RX sent to [facility] while she waits on her mail order

## 2021-10-03 DIAGNOSIS — I219 Acute myocardial infarction, unspecified: Secondary | ICD-10-CM | POA: Diagnosis not present

## 2021-10-03 DIAGNOSIS — J189 Pneumonia, unspecified organism: Secondary | ICD-10-CM | POA: Diagnosis not present

## 2021-10-03 DIAGNOSIS — F331 Major depressive disorder, recurrent, moderate: Secondary | ICD-10-CM | POA: Diagnosis not present

## 2021-10-03 DIAGNOSIS — I110 Hypertensive heart disease with heart failure: Secondary | ICD-10-CM | POA: Diagnosis not present

## 2021-10-03 DIAGNOSIS — I2510 Atherosclerotic heart disease of native coronary artery without angina pectoris: Secondary | ICD-10-CM | POA: Diagnosis not present

## 2021-10-03 DIAGNOSIS — I5042 Chronic combined systolic (congestive) and diastolic (congestive) heart failure: Secondary | ICD-10-CM | POA: Diagnosis not present

## 2021-10-03 DIAGNOSIS — I4891 Unspecified atrial fibrillation: Secondary | ICD-10-CM | POA: Diagnosis not present

## 2021-10-10 ENCOUNTER — Ambulatory Visit: Payer: Medicare HMO | Admitting: Family

## 2021-10-15 DIAGNOSIS — I2510 Atherosclerotic heart disease of native coronary artery without angina pectoris: Secondary | ICD-10-CM | POA: Diagnosis not present

## 2021-10-15 DIAGNOSIS — Z23 Encounter for immunization: Secondary | ICD-10-CM | POA: Diagnosis not present

## 2021-10-15 DIAGNOSIS — I5020 Unspecified systolic (congestive) heart failure: Secondary | ICD-10-CM | POA: Diagnosis not present

## 2021-10-15 DIAGNOSIS — I4892 Unspecified atrial flutter: Secondary | ICD-10-CM | POA: Diagnosis not present

## 2021-10-15 DIAGNOSIS — J439 Emphysema, unspecified: Secondary | ICD-10-CM | POA: Diagnosis not present

## 2021-10-15 DIAGNOSIS — J449 Chronic obstructive pulmonary disease, unspecified: Secondary | ICD-10-CM | POA: Diagnosis not present

## 2021-10-15 DIAGNOSIS — I4891 Unspecified atrial fibrillation: Secondary | ICD-10-CM | POA: Diagnosis not present

## 2021-10-15 DIAGNOSIS — I482 Chronic atrial fibrillation, unspecified: Secondary | ICD-10-CM | POA: Diagnosis not present

## 2021-10-15 DIAGNOSIS — I739 Peripheral vascular disease, unspecified: Secondary | ICD-10-CM | POA: Diagnosis not present

## 2021-10-15 DIAGNOSIS — I25119 Atherosclerotic heart disease of native coronary artery with unspecified angina pectoris: Secondary | ICD-10-CM | POA: Diagnosis not present

## 2021-11-05 ENCOUNTER — Other Ambulatory Visit: Payer: Self-pay

## 2021-11-05 ENCOUNTER — Encounter: Payer: Self-pay | Admitting: Family

## 2021-11-05 ENCOUNTER — Ambulatory Visit: Payer: Medicare HMO | Attending: Family | Admitting: Family

## 2021-11-05 VITALS — BP 124/64 | HR 58 | Resp 16 | Ht 65.0 in | Wt 152.4 lb

## 2021-11-05 DIAGNOSIS — Y713 Surgical instruments, materials and cardiovascular devices (including sutures) associated with adverse incidents: Secondary | ICD-10-CM | POA: Insufficient documentation

## 2021-11-05 DIAGNOSIS — Z955 Presence of coronary angioplasty implant and graft: Secondary | ICD-10-CM | POA: Diagnosis not present

## 2021-11-05 DIAGNOSIS — I890 Lymphedema, not elsewhere classified: Secondary | ICD-10-CM | POA: Diagnosis not present

## 2021-11-05 DIAGNOSIS — Z79899 Other long term (current) drug therapy: Secondary | ICD-10-CM | POA: Diagnosis not present

## 2021-11-05 DIAGNOSIS — I4891 Unspecified atrial fibrillation: Secondary | ICD-10-CM | POA: Insufficient documentation

## 2021-11-05 DIAGNOSIS — I10 Essential (primary) hypertension: Secondary | ICD-10-CM

## 2021-11-05 DIAGNOSIS — I480 Paroxysmal atrial fibrillation: Secondary | ICD-10-CM | POA: Diagnosis not present

## 2021-11-05 DIAGNOSIS — K224 Dyskinesia of esophagus: Secondary | ICD-10-CM | POA: Diagnosis not present

## 2021-11-05 DIAGNOSIS — I252 Old myocardial infarction: Secondary | ICD-10-CM | POA: Insufficient documentation

## 2021-11-05 DIAGNOSIS — I110 Hypertensive heart disease with heart failure: Secondary | ICD-10-CM | POA: Insufficient documentation

## 2021-11-05 DIAGNOSIS — R399 Unspecified symptoms and signs involving the genitourinary system: Secondary | ICD-10-CM | POA: Diagnosis not present

## 2021-11-05 DIAGNOSIS — Y838 Other surgical procedures as the cause of abnormal reaction of the patient, or of later complication, without mention of misadventure at the time of the procedure: Secondary | ICD-10-CM | POA: Insufficient documentation

## 2021-11-05 DIAGNOSIS — S8011XA Contusion of right lower leg, initial encounter: Secondary | ICD-10-CM | POA: Diagnosis not present

## 2021-11-05 DIAGNOSIS — I5022 Chronic systolic (congestive) heart failure: Secondary | ICD-10-CM | POA: Diagnosis not present

## 2021-11-05 DIAGNOSIS — K219 Gastro-esophageal reflux disease without esophagitis: Secondary | ICD-10-CM | POA: Diagnosis not present

## 2021-11-05 NOTE — Progress Notes (Signed)
Patient ID: [name], female    DOB: Jul 26, 1927, 88 y.o.   MRN: [id number]   [name] is a 88 y/o female with a history of HTN, esophageal spasm, GERD, atrial fibrillation and chronic heart failure.   Echo report from 08/29/21 reviewed and showed an EF of 55-60% without structural changes.   LHC done 08/30/21 showed:   Mid LM to Dist LM lesion is 35% stenosed.   Prox LAD lesion is 50% stenosed.   Prox RCA lesion is 95% stenosed.   Mid RCA lesion is 75% stenosed.   A stent was successfully placed.   A stent was successfully placed.   Post intervention, there is a 0% residual stenosis.   Post intervention, there is a 0% residual stenosis.   There is mild to moderate left ventricular systolic dysfunction.   LV end diastolic pressure is mildly elevated.   The left ventricular ejection fraction is 35-45% by visual estimate.   There is no mitral valve regurgitation. Conclusion Diagnostic cardiac cath because of non-STEMI and heart failure Left ventriculogram shows anterior  hypokinesis Ejection fraction of around 35 to 40% ejection fraction of around 35-40  Admitted 08/28/21 due to shortness of breath due to NSTEMI, AF RVR and acute on chronic HF. Given IV heparin, furosemide and empiric antibiotics. Cardiology consult obtained. Underwent cath with subsequent PCI/stent. PT/OT evaluations done. Discharged after 9 days.   She presents today for a follow up visit with a chief complaint of minimal fatigue with moderate exertion. She describes this as chronic in nature having been present for several years, although recently improving. She denies any CP, SOB, palpitations, difficulty sleeping, dizziness, cough, shortness of breath or weight gain.   She weighs daily and report her weight has steadily decreased since her PCP added metolazone to her medication regimen.   She is working with PT in the home and they reported today her BP was 110/64, which is lower than what it has been. Her PT  contacted her PCP and they want to check her today for a UTI. She denies any increased urgency, frequency, or odors. Once she leaves today, she is going to drop a urine sample off for her PCP.  Past Medical History:  Diagnosis Date   Allergic rhinitis    Arrhythmia    atrial fibrillation   Arthritis    CHF (congestive heart failure) (HCC)    Colon polyp    Edema    Esophageal spasm    GERD (gastroesophageal reflux disease)    Hiatal hernia    Hypertension    Past Surgical History:  Procedure Laterality Date   APPENDECTOMY     CATARACT EXTRACTION     CORONARY STENT INTERVENTION N/A 08/30/2021   Procedure: CORONARY STENT INTERVENTION;  Surgeon: Yolonda Kida, MD;  Location: [facility];  Service: Cardiovascular;  Laterality: N/A;   dilatation and curatage     EYE SURGERY     HYSTEROTOMY     LEFT HEART CATH AND CORONARY ANGIOGRAPHY N/A 08/30/2021   Procedure: LEFT HEART CATH AND CORONARY ANGIOGRAPHY possible PCI and stent;  Surgeon: Yolonda Kida, MD;  Location: [facility];  Service: Cardiovascular;  Laterality: N/A;   OOPHORECTOMY     TONSILLECTOMY     Family History  Problem Relation Age of Onset   Dementia Mother    Heart attack Father    Social History   Tobacco Use   Smoking status: Never   Smokeless tobacco: Never  Substance Use Topics   Alcohol use: Never   Allergies  Allergen Reactions   Amoxicillin-Pot Clavulanate Nausea Only   Cefprozil Nausea Only   Clarithromycin Nausea Only   Etodolac Diarrhea   Prednisone     Other reaction(s): Other (See Comments) Hyperglycemia   Pseudoephedrine     Other reaction(s): Unknown   Penicillins Rash   Sulfa Antibiotics Rash   Prior to Admission medications   Medication Sig Start Date End Date Taking? Authorizing Provider  amiodarone (PACERONE) 200 MG tablet Take 1 tablet (200 mg total) by mouth daily. 09/07/21  Yes Jennye Boroughs, MD  aspirin 81 MG chewable tablet Chew 1 tablet (81 mg  total) by mouth daily. 09/07/21  Yes Jennye Boroughs, MD  Calcium Carbonate-Vitamin D 600-200 MG-UNIT TABS Take 2 tablets by mouth daily.    Yes [provider]  carboxymethylcellul-glycerin (REFRESH OPTIVE) 0.5-0.9 % ophthalmic solution Place 1 drop into both eyes 4 (four) times daily as needed for dry eyes.   Yes [provider]  cetirizine (ZYRTEC) 10 MG tablet Take 10 mg by mouth at bedtime.   Yes [provider]  clopidogrel (PLAVIX) 75 MG tablet Take 1 tablet (75 mg total) by mouth daily with breakfast. 09/07/21  Yes Jennye Boroughs, MD  ELIQUIS 2.5 MG TABS tablet Take 2.5 mg by mouth 2 (two) times daily. 07/18/21  Yes [provider]  escitalopram (LEXAPRO) 5 MG tablet Take 5 mg by mouth in the morning and at bedtime.  02/02/18  Yes [provider]  furosemide (LASIX) 20 MG tablet Take 1 tablet (20 mg total) by mouth daily. 10/02/21  Yes Darylene Price A, FNP  metoprolol tartrate (LOPRESSOR) 100 MG tablet Take 1 tablet (100 mg total) by mouth 2 (two) times daily. 09/06/21  Yes Jennye Boroughs, MD  pantoprazole (PROTONIX) 40 MG tablet Take 40 mg by mouth daily.   Yes [provider]  potassium chloride SA (KLOR-CON M) 20 MEQ tablet Take 1 tablet (20 mEq total) by mouth 2 (two) times daily. 09/06/21  Yes Jennye Boroughs, MD  atorvastatin (LIPITOR) 80 MG tablet Take 1 tablet (80 mg total) by mouth daily. Patient not taking: Reported on 11/05/2021 09/07/21   Jennye Boroughs, MD  benzonatate (TESSALON) 100 MG capsule Take by mouth 3 (three) times daily as needed for cough. Patient not taking: Reported on 11/05/2021    [provider]  fluticasone-salmeterol (ADVAIR) 100-50 MCG/ACT AEPB Inhale 1 puff into the lungs 2 (two) times daily. Patient not taking: Reported on 11/05/2021    [provider]  ipratropium-albuterol (DUONEB) 0.5-2.5 (3) MG/3ML SOLN Inhale 3 mLs into the lungs 3 (three) times daily. Patient not taking: Reported on 11/05/2021  07/06/20   [provider]  tiotropium (SPIRIVA) 18 MCG inhalation capsule Place 18 mcg into inhaler and inhale daily. Patient not taking: Reported on 11/05/2021    [provider]  traMADol (ULTRAM) 50 MG tablet Take by mouth every 6 (six) hours as needed. Has not taken since 12/17 per pt's family member Patient not taking: Reported on 11/05/2021    [provider]   Review of Systems  Constitutional:  Positive for fatigue (improving).  HENT:  Negative for congestion, postnasal drip and sore throat.   Eyes: Negative.   Respiratory:  Negative for cough, chest tightness and shortness of breath.   Cardiovascular:  Positive for leg swelling. Negative for chest pain and palpitations (at times).  Gastrointestinal:  Negative for abdominal distention (on occasion) and abdominal pain.  Endocrine: Negative.   Genitourinary: Negative.  Negative for difficulty urinating, dyspareunia, dysuria, flank pain, frequency, hematuria and urgency.  Skin: Negative.   Allergic/Immunologic: Negative.   Neurological:  Negative for dizziness and light-headedness.  Hematological:  Negative for adenopathy. Bruises/bleeds easily (right lower leg from cath).  Psychiatric/Behavioral:  Negative for dysphoric mood and sleep disturbance (sleeping on 2 pillows). The patient is not nervous/anxious.    Vitals:   11/05/21 1412  BP: 124/64  Pulse: (!) 58  Resp: 16  SpO2: 98%  Weight: 152 lb 6 oz (69.1 kg)  Height: 5\' 5"  (1.651 m)   Wt Readings from Last 3 Encounters:  11/05/21 152 lb 6 oz (69.1 kg)  09/26/21 162 lb (73.5 kg)  09/05/21 158 lb (71.7 kg)   Lab Results  Component Value Date   CREATININE 0.71 09/04/2021   CREATININE 0.97 09/03/2021   CREATININE 0.97 09/02/2021   Physical Exam Vitals and nursing note reviewed. Exam conducted with a chaperone present (niece).  Constitutional:      General: She is not in acute distress.    Appearance: Normal appearance.  HENT:     Head:  Normocephalic and atraumatic.  Cardiovascular:     Rate and Rhythm: Bradycardia present. Rhythm irregular.  Pulmonary:     Effort: Pulmonary effort is normal. No respiratory distress.     Breath sounds: Wheezing (clears with cough) present. No rales.  Abdominal:     General: There is no distension.     Palpations: Abdomen is soft.  Musculoskeletal:        General: No tenderness.     Cervical back: Normal range of motion and neck supple.     Right lower leg: Edema (trace) present.     Left lower leg: Edema (trace) present.  Skin:    General: Skin is warm and dry.     Findings: Bruising (right lower leg) present.  Neurological:     General: No focal deficit present.     Mental Status: She is alert and oriented to person, place, and time.  Psychiatric:        Mood and Affect: Mood normal.        Behavior: Behavior normal.        Thought Content: Thought content normal.   Assessment & Plan:  1: Chronic heart failure with reduced ejection fraction (by cath)- - NYHA class II - euvolemic today - weighing daily; reminded to call for an overnight weight gain of > 2 pounds or a weekly weight gain of > 5 pounds - weight down 10 lbs from last visit, states since starting metolazone weight has decreased steadily - not adding salt and has been trying to follow a low sodium diet - on GDMT of metoprolol (although tartrate) - concerns from home health about UTI and recent low blood pressures, will not start SGLT2, MRA or entresto at this time - BNP 08/28/21 was 1095.0  2: HTN- - BP 126/64; reports lower at home by home health staff that have been seeing her - will see PCP on 11/19/21 - BMP 09/04/21 reviewed and showed sodium 136, potassium 3.7, creatinine 0.71 and GFR >60  3: Atrial fibrillation- - saw cardiology Petra Kuba) 06/13/21 ; will see cardiology again on 12/11/21 - HR regular today   4: Lymphedema- - significantly improved since last visit - wearing compression hose daily - PCP  added metolazone twice/week    Medication list reviewed.   Return in 6 weeks or sooner for any questions/problems before then.

## 2021-11-05 NOTE — Patient Instructions (Signed)
[facility] will be moving around the corner to [id number] mid-February. Our phone number will remain the same.  Ask Dr. Sabra Heck about your inhalers.  Return to the HF clinic as needed.

## 2021-11-19 DIAGNOSIS — I5023 Acute on chronic systolic (congestive) heart failure: Secondary | ICD-10-CM | POA: Diagnosis not present

## 2021-11-19 DIAGNOSIS — Z0000 Encounter for general adult medical examination without abnormal findings: Secondary | ICD-10-CM | POA: Diagnosis not present

## 2021-11-19 DIAGNOSIS — I482 Chronic atrial fibrillation, unspecified: Secondary | ICD-10-CM | POA: Diagnosis not present

## 2021-12-05 DIAGNOSIS — J470 Bronchiectasis with acute lower respiratory infection: Secondary | ICD-10-CM | POA: Diagnosis not present

## 2021-12-05 DIAGNOSIS — I5030 Unspecified diastolic (congestive) heart failure: Secondary | ICD-10-CM | POA: Diagnosis not present

## 2021-12-05 DIAGNOSIS — J188 Other pneumonia, unspecified organism: Secondary | ICD-10-CM | POA: Diagnosis not present

## 2021-12-05 DIAGNOSIS — I482 Chronic atrial fibrillation, unspecified: Secondary | ICD-10-CM | POA: Diagnosis not present

## 2021-12-05 DIAGNOSIS — J189 Pneumonia, unspecified organism: Secondary | ICD-10-CM | POA: Diagnosis not present

## 2021-12-09 DIAGNOSIS — J449 Chronic obstructive pulmonary disease, unspecified: Secondary | ICD-10-CM | POA: Diagnosis not present

## 2021-12-09 DIAGNOSIS — I4891 Unspecified atrial fibrillation: Secondary | ICD-10-CM | POA: Diagnosis not present

## 2021-12-09 DIAGNOSIS — J188 Other pneumonia, unspecified organism: Secondary | ICD-10-CM | POA: Diagnosis not present

## 2021-12-09 DIAGNOSIS — I5020 Unspecified systolic (congestive) heart failure: Secondary | ICD-10-CM | POA: Diagnosis not present

## 2021-12-11 DIAGNOSIS — I25119 Atherosclerotic heart disease of native coronary artery with unspecified angina pectoris: Secondary | ICD-10-CM | POA: Diagnosis not present

## 2021-12-11 DIAGNOSIS — I4891 Unspecified atrial fibrillation: Secondary | ICD-10-CM | POA: Diagnosis not present

## 2021-12-11 DIAGNOSIS — I4819 Other persistent atrial fibrillation: Secondary | ICD-10-CM | POA: Diagnosis not present

## 2021-12-11 DIAGNOSIS — I4892 Unspecified atrial flutter: Secondary | ICD-10-CM | POA: Diagnosis not present

## 2021-12-11 DIAGNOSIS — I10 Essential (primary) hypertension: Secondary | ICD-10-CM | POA: Diagnosis not present

## 2021-12-11 DIAGNOSIS — I5023 Acute on chronic systolic (congestive) heart failure: Secondary | ICD-10-CM | POA: Diagnosis not present

## 2022-01-07 ENCOUNTER — Other Ambulatory Visit: Payer: Self-pay | Admitting: Family

## 2022-01-14 DIAGNOSIS — J189 Pneumonia, unspecified organism: Secondary | ICD-10-CM | POA: Diagnosis not present

## 2022-01-14 DIAGNOSIS — I509 Heart failure, unspecified: Secondary | ICD-10-CM | POA: Diagnosis not present

## 2022-01-14 DIAGNOSIS — E538 Deficiency of other specified B group vitamins: Secondary | ICD-10-CM | POA: Diagnosis not present

## 2022-01-14 DIAGNOSIS — F32A Depression, unspecified: Secondary | ICD-10-CM | POA: Diagnosis not present

## 2022-01-14 DIAGNOSIS — Z0000 Encounter for general adult medical examination without abnormal findings: Secondary | ICD-10-CM | POA: Diagnosis not present

## 2022-01-14 DIAGNOSIS — I4891 Unspecified atrial fibrillation: Secondary | ICD-10-CM | POA: Diagnosis not present

## 2022-01-14 DIAGNOSIS — Z79899 Other long term (current) drug therapy: Secondary | ICD-10-CM | POA: Diagnosis not present

## 2022-01-16 DIAGNOSIS — H353131 Nonexudative age-related macular degeneration, bilateral, early dry stage: Secondary | ICD-10-CM | POA: Diagnosis not present

## 2022-01-24 IMAGING — CT CT ANGIO CHEST
2 of 7 series · 18 of 46 positions shown · IV contrast (APPLIED)
Comparison: Chest x-ray 08/28/2021, CT chest 08/27/2020, CT chest
03/05/2009

CLINICAL DATA: Shortness of breath

EXAM:
CT ANGIOGRAPHY CHEST WITH CONTRAST
TECHNIQUE: Multidetector CT imaging of the chest was performed using the
standard protocol during bolus administration of intravenous
contrast. Multiplanar CT image reconstructions and MIPs were
obtained to evaluate the vascular anatomy.
CONTRAST:  75mL OMNIPAQUE IOHEXOL 350 MG/ML SOLN

[Series 5: thins · axial · 0.71mm/px · z∈[-772,-506]mm · 15 of 370 slices shown]
[im 19/370  lung]
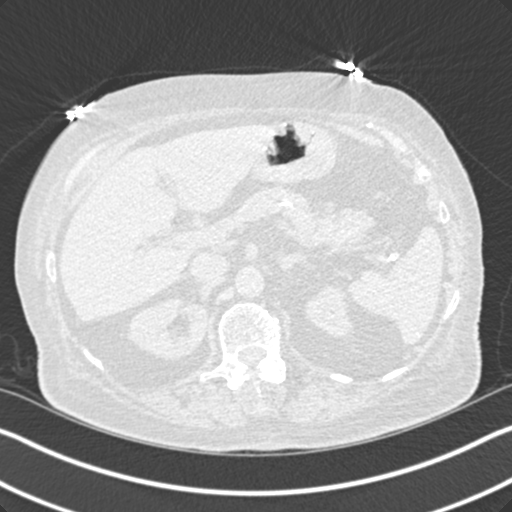
[im 37/370  soft-tissue]
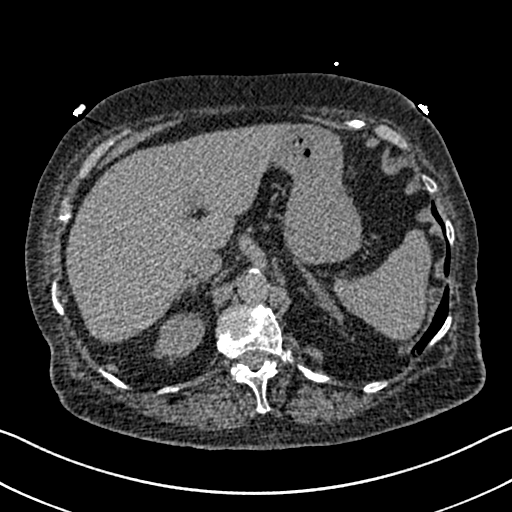
[im 74/370  lung]
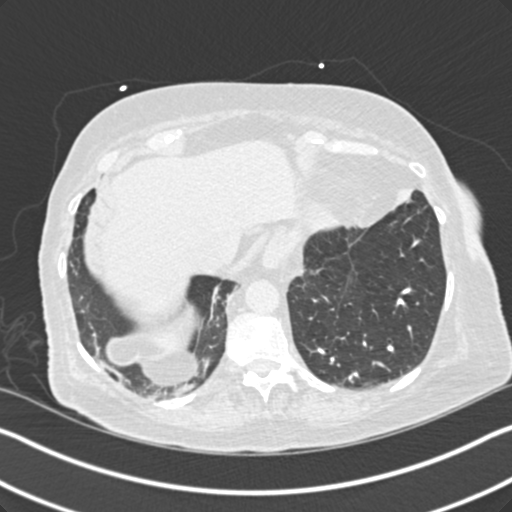
[im 93/370  soft-tissue]
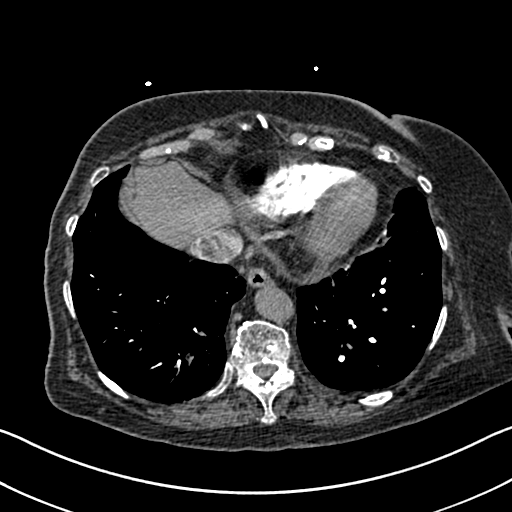
[im 111/370  lung]
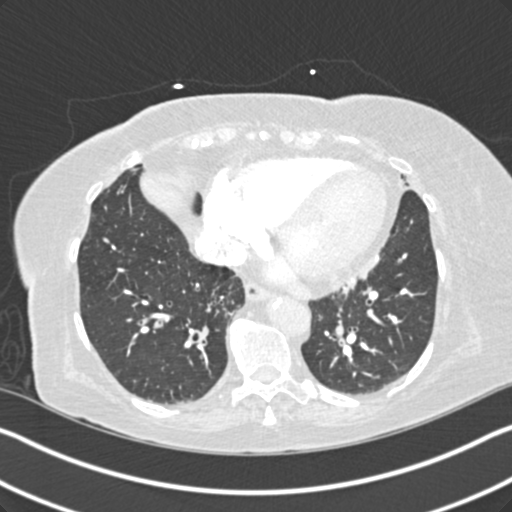
[im 130/370  soft-tissue]
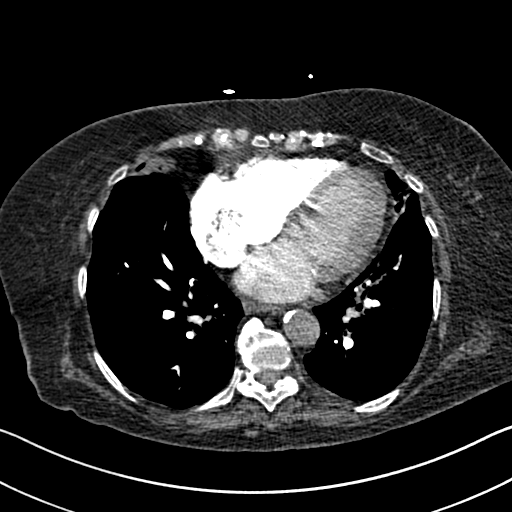
[im 167/370  lung]
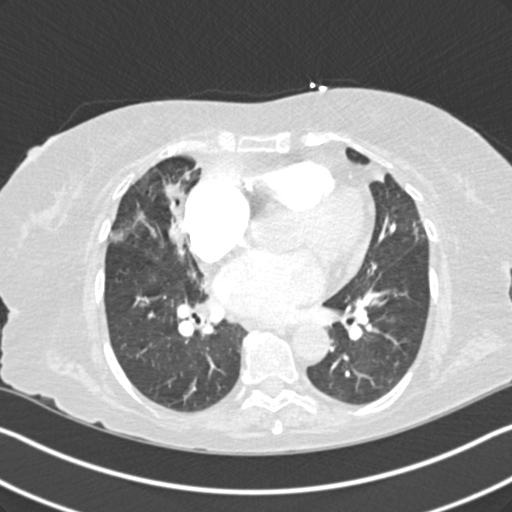
[im 185/370  soft-tissue]
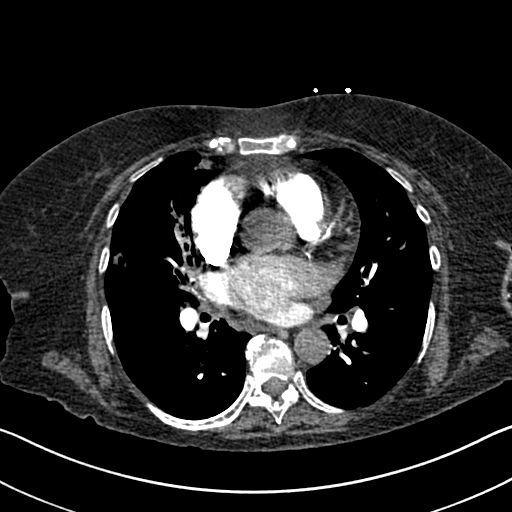
[im 203/370  lung]
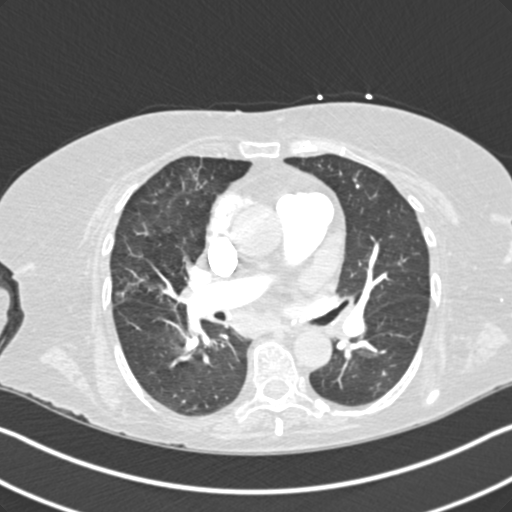
[im 240/370  soft-tissue]
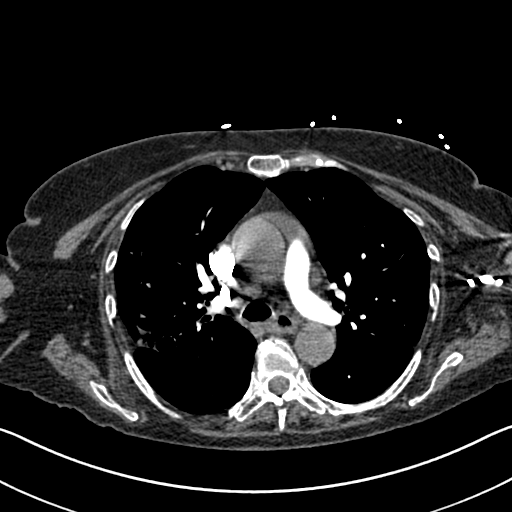
[im 259/370  lung]
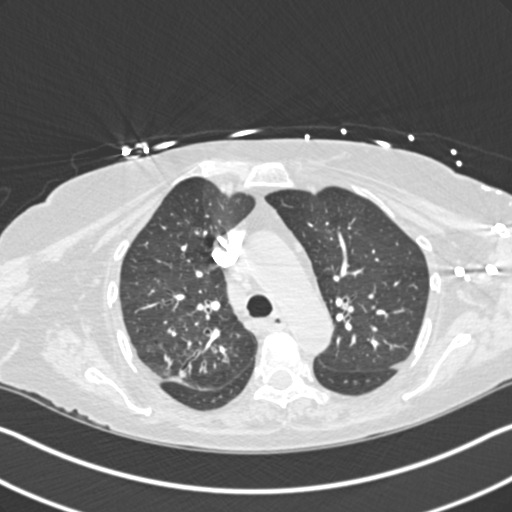
[im 277/370  soft-tissue]
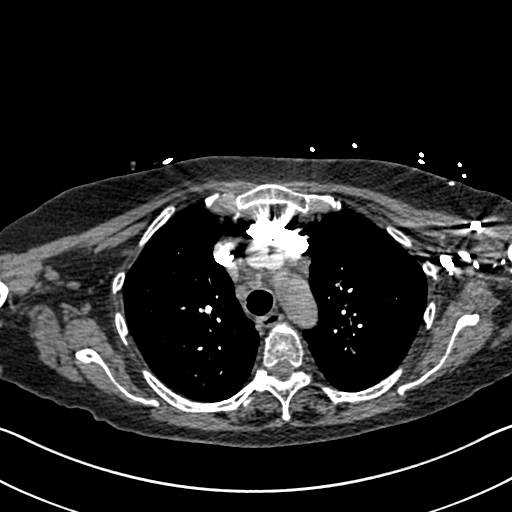
[im 296/370  lung]
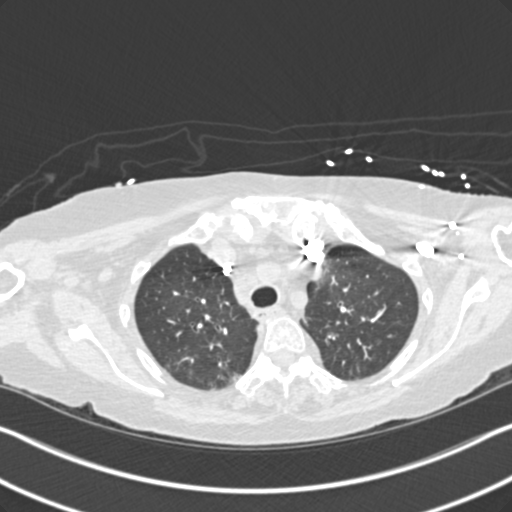
[im 333/370  soft-tissue]
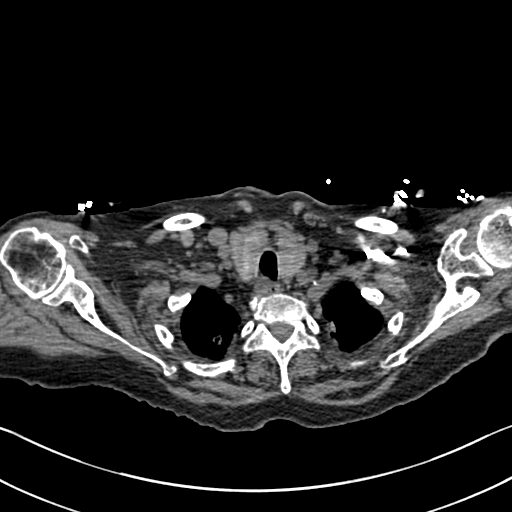
[im 351/370  lung]
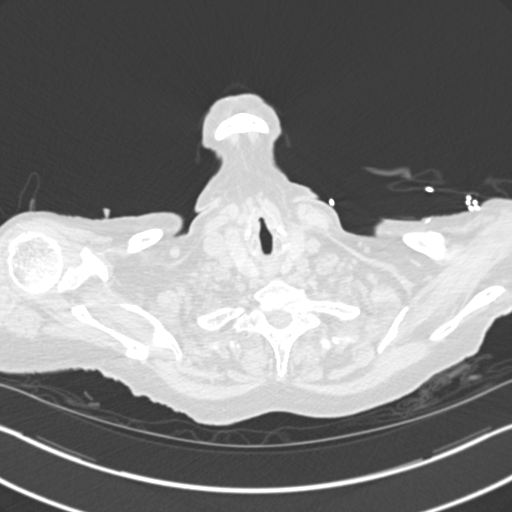

[Series 7: coronal mpr · coronal · 0.58mm/px · 3 of 82 slices shown]
[im 21/82  soft-tissue]
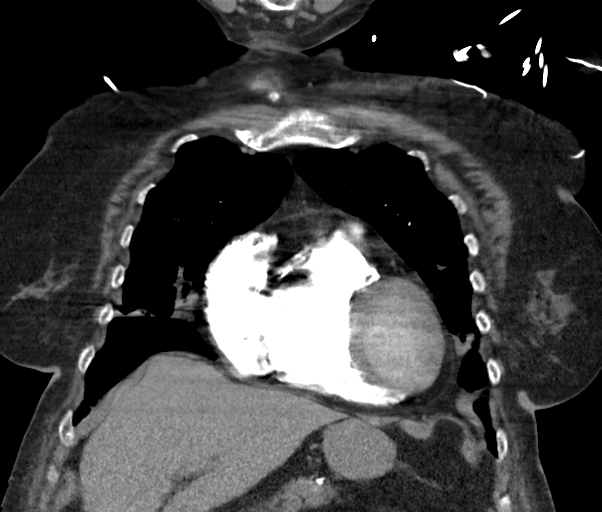
[im 41/82  soft-tissue]
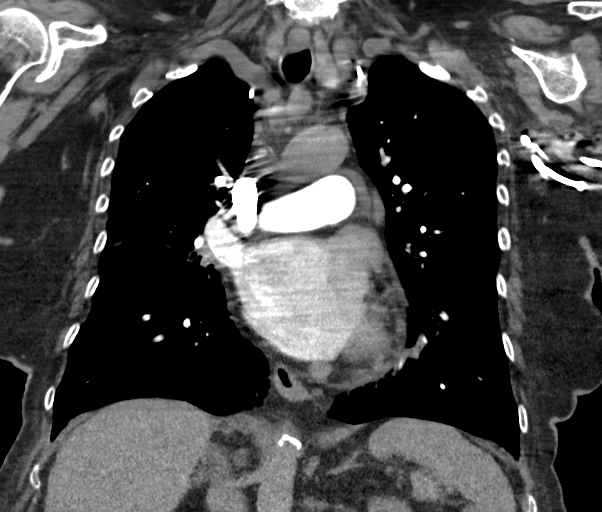
[im 61/82  soft-tissue]
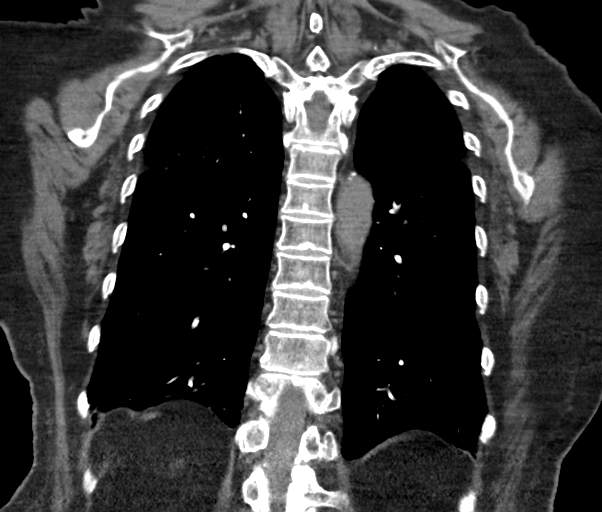

[18 of 46 positions shown; findings below may reference images not displayed]

FINDINGS: Cardiovascular: Satisfactory opacification of the pulmonary arteries
to the segmental level. No evidence of pulmonary embolism. Mild
aortic atherosclerosis. No aneurysm. Coronary vascular
calcification. Borderline cardiomegaly. No pericardial effusion.

Mediastinum/Nodes: Midline trachea. Mild mediastinal adenopathy,
right paratracheal lymph node measures 15 mm. Subcarinal node
measures 17 mm. Small right hilar nodes. Esophagus within normal
limits.

Lungs/Pleura: Apical scarring with mild bronchiectasis in the right
upper and middle lobes. Chronic scarring in the right upper lobe. No
definite acute superimposed airspace disease. No pleural effusion or
pneumothorax.

Upper Abdomen: No acute abnormality.

Musculoskeletal: No chest wall abnormality. No acute or significant
osseous findings.

Review of the MIP images confirms the above findings.
IMPRESSION: 1. Negative for acute pulmonary embolus.
2. Chronic lung disease and sequelae of prior infection most evident
in the right upper and middle lobes. No definite acute superimposed
airspace disease.
3. Mild mediastinal adenopathy nonspecific, possibly reactive.

Aortic Atherosclerosis (MCXS8-SRH.H).

## 2022-01-24 IMAGING — CR DG CHEST 2V
1 series · 2 of 2 positions shown · non-contrast
Comparison: August 27, 2020.

CLINICAL DATA: Shortness of breath.

EXAM:
CHEST - 2 VIEW

[Series 1: dg chest 2 view · 0.14mm/px · 2 of 2 slices shown]
[im 1/2]
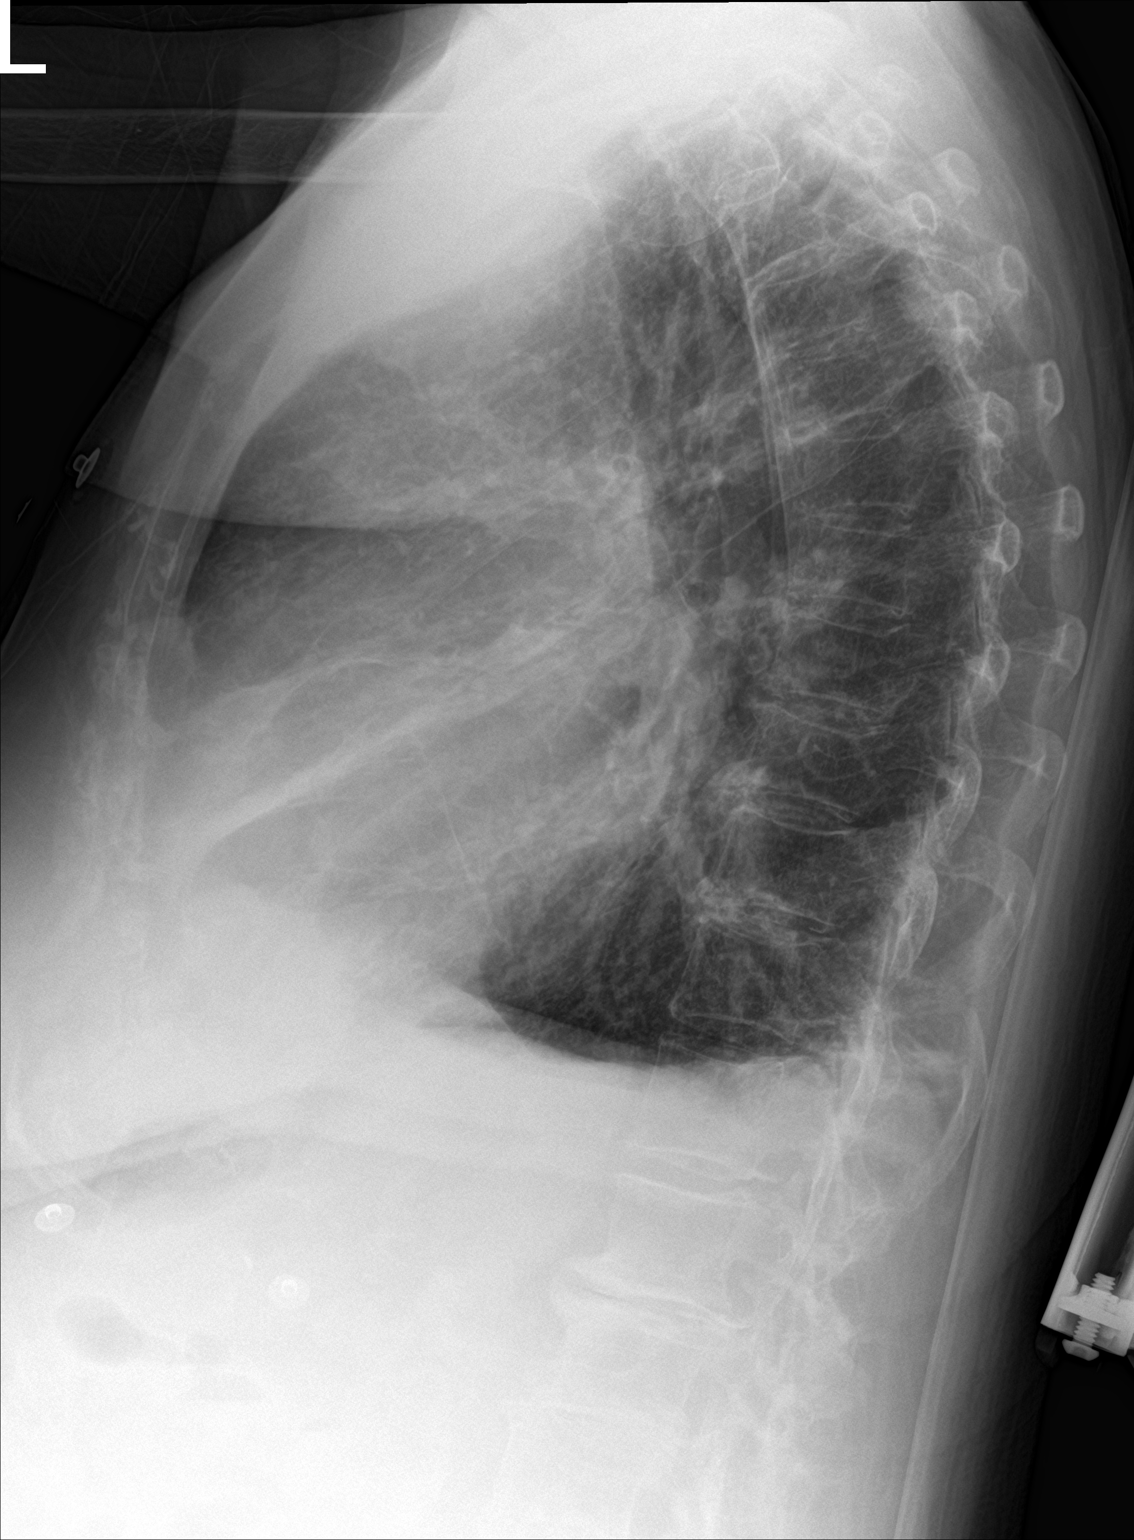
[im 2/2]
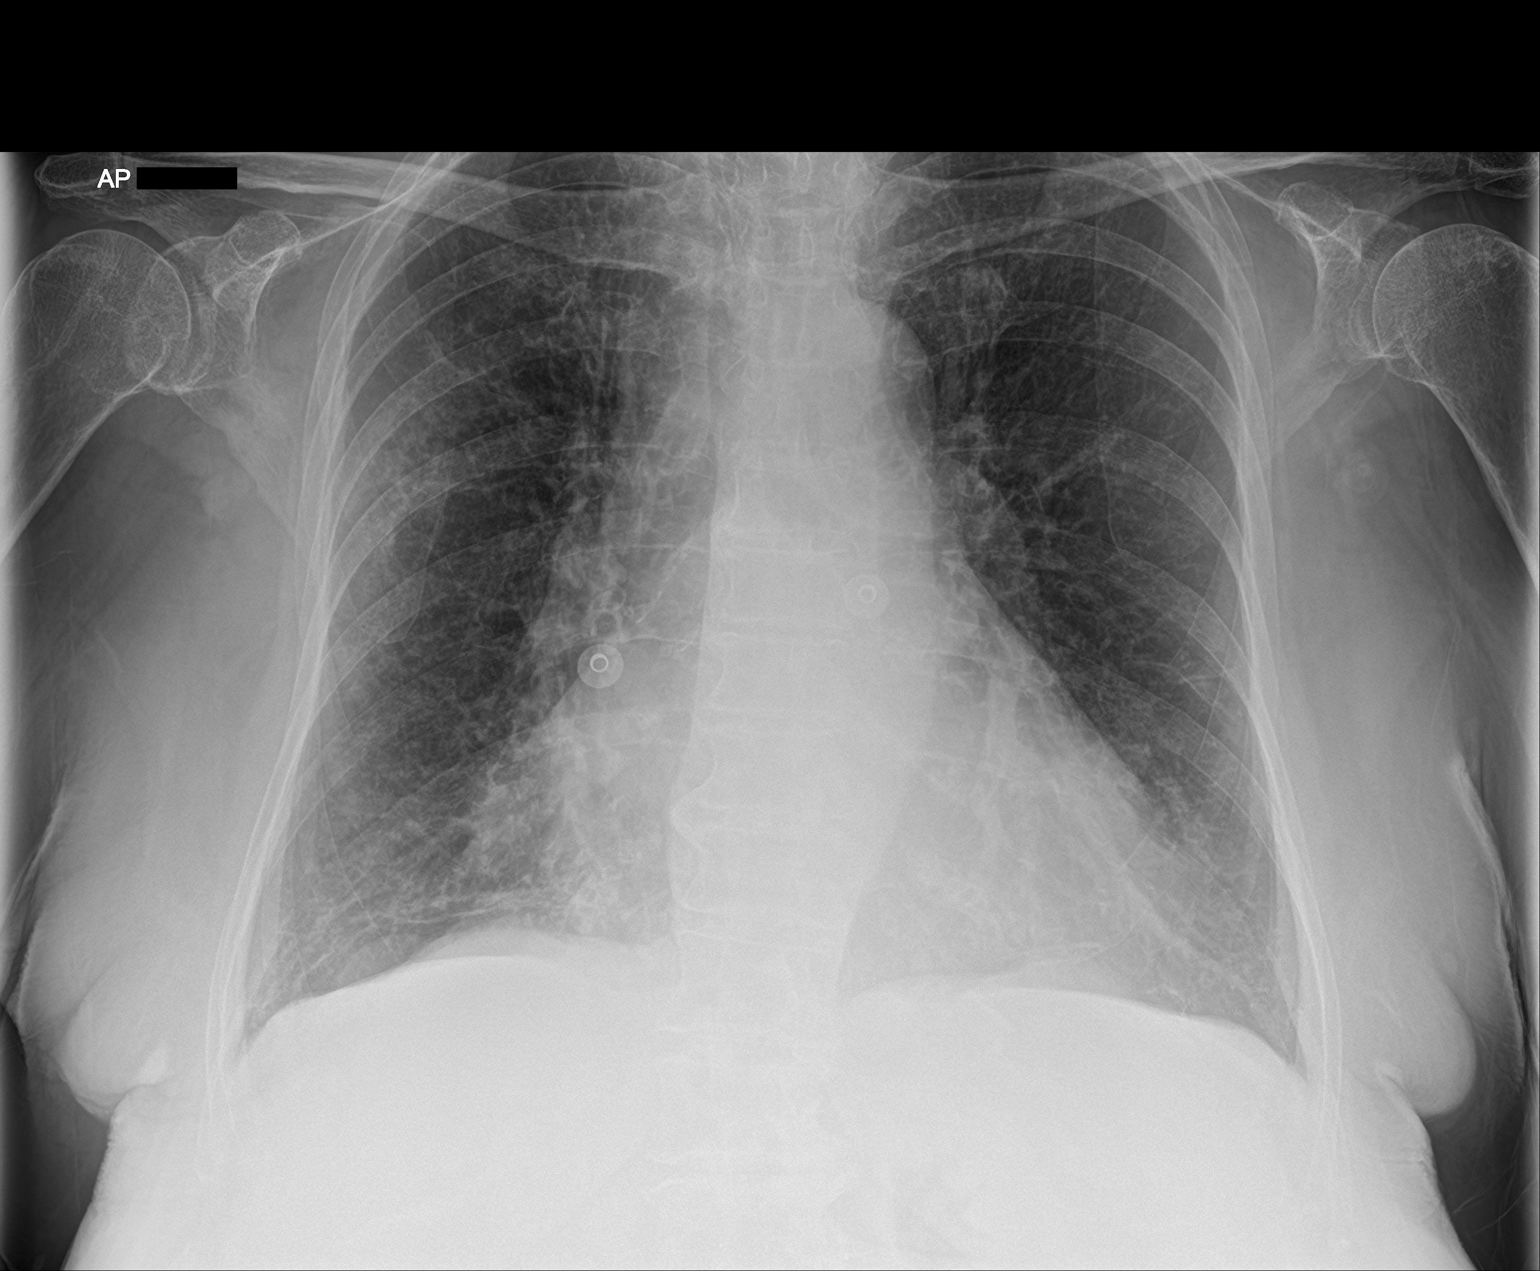

[2 of 2 positions shown; findings below may reference images not displayed]

FINDINGS: Stable cardiomegaly. No pneumothorax is noted. Mild bibasilar
subsegmental atelectasis is noted. Bony thorax is unremarkable.
IMPRESSION: Mild bibasilar subsegmental atelectasis.

## 2022-05-15 DIAGNOSIS — E46 Unspecified protein-calorie malnutrition: Secondary | ICD-10-CM | POA: Diagnosis not present

## 2022-05-15 DIAGNOSIS — M5416 Radiculopathy, lumbar region: Secondary | ICD-10-CM | POA: Diagnosis not present

## 2022-05-15 DIAGNOSIS — J449 Chronic obstructive pulmonary disease, unspecified: Secondary | ICD-10-CM | POA: Diagnosis not present

## 2022-05-15 DIAGNOSIS — N3090 Cystitis, unspecified without hematuria: Secondary | ICD-10-CM | POA: Diagnosis not present

## 2022-05-15 DIAGNOSIS — N390 Urinary tract infection, site not specified: Secondary | ICD-10-CM | POA: Diagnosis not present

## 2022-05-15 DIAGNOSIS — I4891 Unspecified atrial fibrillation: Secondary | ICD-10-CM | POA: Diagnosis not present

## 2022-05-22 DIAGNOSIS — I10 Essential (primary) hypertension: Secondary | ICD-10-CM | POA: Diagnosis not present

## 2022-05-22 DIAGNOSIS — I2510 Atherosclerotic heart disease of native coronary artery without angina pectoris: Secondary | ICD-10-CM | POA: Diagnosis not present

## 2022-05-22 DIAGNOSIS — E782 Mixed hyperlipidemia: Secondary | ICD-10-CM | POA: Diagnosis not present

## 2022-05-22 DIAGNOSIS — Z79899 Other long term (current) drug therapy: Secondary | ICD-10-CM | POA: Diagnosis not present

## 2022-05-22 DIAGNOSIS — I4891 Unspecified atrial fibrillation: Secondary | ICD-10-CM | POA: Diagnosis not present

## 2022-05-22 DIAGNOSIS — I4892 Unspecified atrial flutter: Secondary | ICD-10-CM | POA: Diagnosis not present

## 2022-05-22 DIAGNOSIS — I5022 Chronic systolic (congestive) heart failure: Secondary | ICD-10-CM | POA: Diagnosis not present

## 2022-05-22 DIAGNOSIS — I25119 Atherosclerotic heart disease of native coronary artery with unspecified angina pectoris: Secondary | ICD-10-CM | POA: Diagnosis not present

## 2022-06-10 DIAGNOSIS — I5022 Chronic systolic (congestive) heart failure: Secondary | ICD-10-CM | POA: Diagnosis not present

## 2022-06-23 DIAGNOSIS — J471 Bronchiectasis with (acute) exacerbation: Secondary | ICD-10-CM | POA: Diagnosis not present

## 2022-06-23 DIAGNOSIS — R059 Cough, unspecified: Secondary | ICD-10-CM | POA: Diagnosis not present

## 2022-06-23 DIAGNOSIS — J40 Bronchitis, not specified as acute or chronic: Secondary | ICD-10-CM | POA: Diagnosis not present

## 2022-06-23 DIAGNOSIS — R918 Other nonspecific abnormal finding of lung field: Secondary | ICD-10-CM | POA: Diagnosis not present

## 2022-06-23 DIAGNOSIS — Z8709 Personal history of other diseases of the respiratory system: Secondary | ICD-10-CM | POA: Diagnosis not present

## 2022-07-16 DIAGNOSIS — J40 Bronchitis, not specified as acute or chronic: Secondary | ICD-10-CM | POA: Diagnosis not present

## 2022-07-16 DIAGNOSIS — R918 Other nonspecific abnormal finding of lung field: Secondary | ICD-10-CM | POA: Diagnosis not present

## 2022-07-16 DIAGNOSIS — J069 Acute upper respiratory infection, unspecified: Secondary | ICD-10-CM | POA: Diagnosis not present

## 2022-07-16 DIAGNOSIS — Z03818 Encounter for observation for suspected exposure to other biological agents ruled out: Secondary | ICD-10-CM | POA: Diagnosis not present

## 2022-07-16 DIAGNOSIS — Z8701 Personal history of pneumonia (recurrent): Secondary | ICD-10-CM | POA: Diagnosis not present

## 2022-07-16 DIAGNOSIS — R112 Nausea with vomiting, unspecified: Secondary | ICD-10-CM | POA: Diagnosis not present

## 2022-07-17 ENCOUNTER — Inpatient Hospital Stay
Admission: EM | Admit: 2022-07-17 | Discharge: 2022-07-24 | DRG: 194 | Disposition: A | Discharge status: Skilled Nursing Facility | Payer: Medicare HMO | Attending: Internal Medicine | Admitting: Internal Medicine

## 2022-07-17 ENCOUNTER — Other Ambulatory Visit: Payer: Self-pay

## 2022-07-17 ENCOUNTER — Emergency Department: Payer: Medicare HMO

## 2022-07-17 ENCOUNTER — Inpatient Hospital Stay: Payer: Medicare HMO

## 2022-07-17 DIAGNOSIS — K8020 Calculus of gallbladder without cholecystitis without obstruction: Secondary | ICD-10-CM | POA: Diagnosis not present

## 2022-07-17 DIAGNOSIS — R112 Nausea with vomiting, unspecified: Secondary | ICD-10-CM

## 2022-07-17 DIAGNOSIS — M160 Bilateral primary osteoarthritis of hip: Secondary | ICD-10-CM | POA: Diagnosis not present

## 2022-07-17 DIAGNOSIS — I4891 Unspecified atrial fibrillation: Secondary | ICD-10-CM | POA: Diagnosis not present

## 2022-07-17 DIAGNOSIS — R7989 Other specified abnormal findings of blood chemistry: Secondary | ICD-10-CM | POA: Diagnosis not present

## 2022-07-17 DIAGNOSIS — I10 Essential (primary) hypertension: Secondary | ICD-10-CM

## 2022-07-17 DIAGNOSIS — K219 Gastro-esophageal reflux disease without esophagitis: Secondary | ICD-10-CM | POA: Diagnosis present

## 2022-07-17 DIAGNOSIS — T502X5A Adverse effect of carbonic-anhydrase inhibitors, benzothiadiazides and other diuretics, initial encounter: Secondary | ICD-10-CM | POA: Diagnosis present

## 2022-07-17 DIAGNOSIS — E860 Dehydration: Secondary | ICD-10-CM | POA: Diagnosis present

## 2022-07-17 DIAGNOSIS — I482 Chronic atrial fibrillation, unspecified: Secondary | ICD-10-CM | POA: Diagnosis not present

## 2022-07-17 DIAGNOSIS — I5030 Unspecified diastolic (congestive) heart failure: Secondary | ICD-10-CM | POA: Diagnosis not present

## 2022-07-17 DIAGNOSIS — Z8249 Family history of ischemic heart disease and other diseases of the circulatory system: Secondary | ICD-10-CM | POA: Diagnosis not present

## 2022-07-17 DIAGNOSIS — M47816 Spondylosis without myelopathy or radiculopathy, lumbar region: Secondary | ICD-10-CM | POA: Diagnosis not present

## 2022-07-17 DIAGNOSIS — Z7401 Bed confinement status: Secondary | ICD-10-CM | POA: Diagnosis not present

## 2022-07-17 DIAGNOSIS — I5032 Chronic diastolic (congestive) heart failure: Secondary | ICD-10-CM | POA: Diagnosis present

## 2022-07-17 DIAGNOSIS — E861 Hypovolemia: Secondary | ICD-10-CM | POA: Diagnosis present

## 2022-07-17 DIAGNOSIS — R9431 Abnormal electrocardiogram [ECG] [EKG]: Secondary | ICD-10-CM | POA: Diagnosis present

## 2022-07-17 DIAGNOSIS — A084 Viral intestinal infection, unspecified: Secondary | ICD-10-CM | POA: Diagnosis present

## 2022-07-17 DIAGNOSIS — Z79899 Other long term (current) drug therapy: Secondary | ICD-10-CM | POA: Diagnosis not present

## 2022-07-17 DIAGNOSIS — R059 Cough, unspecified: Secondary | ICD-10-CM | POA: Diagnosis not present

## 2022-07-17 DIAGNOSIS — F32A Depression, unspecified: Secondary | ICD-10-CM | POA: Diagnosis present

## 2022-07-17 DIAGNOSIS — I509 Heart failure, unspecified: Secondary | ICD-10-CM | POA: Diagnosis not present

## 2022-07-17 DIAGNOSIS — Y92009 Unspecified place in unspecified non-institutional (private) residence as the place of occurrence of the external cause: Secondary | ICD-10-CM

## 2022-07-17 DIAGNOSIS — Z888 Allergy status to other drugs, medicaments and biological substances status: Secondary | ICD-10-CM | POA: Diagnosis not present

## 2022-07-17 DIAGNOSIS — W19XXXA Unspecified fall, initial encounter: Secondary | ICD-10-CM

## 2022-07-17 DIAGNOSIS — I480 Paroxysmal atrial fibrillation: Secondary | ICD-10-CM | POA: Diagnosis present

## 2022-07-17 DIAGNOSIS — R140 Abdominal distension (gaseous): Secondary | ICD-10-CM | POA: Diagnosis not present

## 2022-07-17 DIAGNOSIS — Z66 Do not resuscitate: Secondary | ICD-10-CM | POA: Diagnosis not present

## 2022-07-17 DIAGNOSIS — I2510 Atherosclerotic heart disease of native coronary artery without angina pectoris: Secondary | ICD-10-CM | POA: Diagnosis present

## 2022-07-17 DIAGNOSIS — Z9849 Cataract extraction status, unspecified eye: Secondary | ICD-10-CM

## 2022-07-17 DIAGNOSIS — E440 Moderate protein-calorie malnutrition: Secondary | ICD-10-CM | POA: Diagnosis not present

## 2022-07-17 DIAGNOSIS — Z20822 Contact with and (suspected) exposure to covid-19: Secondary | ICD-10-CM | POA: Diagnosis not present

## 2022-07-17 DIAGNOSIS — I110 Hypertensive heart disease with heart failure: Secondary | ICD-10-CM | POA: Diagnosis not present

## 2022-07-17 DIAGNOSIS — Z7901 Long term (current) use of anticoagulants: Secondary | ICD-10-CM

## 2022-07-17 DIAGNOSIS — R29898 Other symptoms and signs involving the musculoskeletal system: Secondary | ICD-10-CM | POA: Diagnosis not present

## 2022-07-17 DIAGNOSIS — E876 Hypokalemia: Secondary | ICD-10-CM | POA: Diagnosis present

## 2022-07-17 DIAGNOSIS — Z880 Allergy status to penicillin: Secondary | ICD-10-CM | POA: Diagnosis not present

## 2022-07-17 DIAGNOSIS — B379 Candidiasis, unspecified: Secondary | ICD-10-CM | POA: Diagnosis present

## 2022-07-17 DIAGNOSIS — Z882 Allergy status to sulfonamides status: Secondary | ICD-10-CM

## 2022-07-17 DIAGNOSIS — E785 Hyperlipidemia, unspecified: Secondary | ICD-10-CM | POA: Diagnosis not present

## 2022-07-17 DIAGNOSIS — R531 Weakness: Secondary | ICD-10-CM | POA: Diagnosis not present

## 2022-07-17 DIAGNOSIS — G9389 Other specified disorders of brain: Secondary | ICD-10-CM | POA: Diagnosis not present

## 2022-07-17 DIAGNOSIS — R110 Nausea: Secondary | ICD-10-CM | POA: Diagnosis not present

## 2022-07-17 DIAGNOSIS — B370 Candidal stomatitis: Secondary | ICD-10-CM | POA: Diagnosis not present

## 2022-07-17 DIAGNOSIS — R197 Diarrhea, unspecified: Secondary | ICD-10-CM | POA: Diagnosis not present

## 2022-07-17 DIAGNOSIS — J323 Chronic sphenoidal sinusitis: Secondary | ICD-10-CM | POA: Diagnosis not present

## 2022-07-17 DIAGNOSIS — R42 Dizziness and giddiness: Secondary | ICD-10-CM

## 2022-07-17 DIAGNOSIS — Z7902 Long term (current) use of antithrombotics/antiplatelets: Secondary | ICD-10-CM

## 2022-07-17 DIAGNOSIS — K5900 Constipation, unspecified: Secondary | ICD-10-CM | POA: Diagnosis present

## 2022-07-17 DIAGNOSIS — I252 Old myocardial infarction: Secondary | ICD-10-CM

## 2022-07-17 DIAGNOSIS — E871 Hypo-osmolality and hyponatremia: Secondary | ICD-10-CM | POA: Diagnosis not present

## 2022-07-17 DIAGNOSIS — R9082 White matter disease, unspecified: Secondary | ICD-10-CM | POA: Diagnosis not present

## 2022-07-17 DIAGNOSIS — J189 Pneumonia, unspecified organism: Secondary | ICD-10-CM | POA: Diagnosis not present

## 2022-07-17 DIAGNOSIS — R1011 Right upper quadrant pain: Secondary | ICD-10-CM | POA: Diagnosis not present

## 2022-07-17 DIAGNOSIS — Z7982 Long term (current) use of aspirin: Secondary | ICD-10-CM

## 2022-07-17 DIAGNOSIS — E222 Syndrome of inappropriate secretion of antidiuretic hormone: Secondary | ICD-10-CM | POA: Diagnosis present

## 2022-07-17 DIAGNOSIS — J168 Pneumonia due to other specified infectious organisms: Secondary | ICD-10-CM | POA: Diagnosis not present

## 2022-07-17 DIAGNOSIS — J449 Chronic obstructive pulmonary disease, unspecified: Secondary | ICD-10-CM | POA: Diagnosis not present

## 2022-07-17 LAB — CBC WITH DIFFERENTIAL/PLATELET
Abs Immature Granulocytes: 0.05 10*3/uL (ref 0.00–0.07)
Basophils Absolute: 0 10*3/uL (ref 0.0–0.1)
Basophils Relative: 0 %
Eosinophils Absolute: 0 10*3/uL (ref 0.0–0.5)
Eosinophils Relative: 0 %
HCT: 39.1 % (ref 36.0–46.0)
Hemoglobin: 13.5 g/dL (ref 12.0–15.0)
Immature Granulocytes: 1 %
Lymphocytes Relative: 7 %
Lymphs Abs: 0.6 10*3/uL — ABNORMAL LOW (ref 0.7–4.0)
MCH: 30.5 pg (ref 26.0–34.0)
MCHC: 34.5 g/dL (ref 30.0–36.0)
MCV: 88.5 fL (ref 80.0–100.0)
Monocytes Absolute: 1 10*3/uL (ref 0.1–1.0)
Monocytes Relative: 10 %
Neutro Abs: 7.8 10*3/uL — ABNORMAL HIGH (ref 1.7–7.7)
Neutrophils Relative %: 82 %
Platelets: 201 10*3/uL (ref 150–400)
RBC: 4.42 MIL/uL (ref 3.87–5.11)
RDW: 14.9 % (ref 11.5–15.5)
WBC: 9.5 10*3/uL (ref 4.0–10.5)
nRBC: 0 % (ref 0.0–0.2)

## 2022-07-17 LAB — COMPREHENSIVE METABOLIC PANEL
ALT: 81 U/L — ABNORMAL HIGH (ref 0–44)
AST: 63 U/L — ABNORMAL HIGH (ref 15–41)
Albumin: 3.5 g/dL (ref 3.5–5.0)
Alkaline Phosphatase: 102 U/L (ref 38–126)
Anion gap: 10 (ref 5–15)
BUN: 16 mg/dL (ref 8–23)
CO2: 29 mmol/L (ref 22–32)
Calcium: 9.1 mg/dL (ref 8.9–10.3)
Chloride: 79 mmol/L — ABNORMAL LOW (ref 98–111)
Creatinine, Ser: 0.88 mg/dL (ref 0.44–1.00)
GFR, Estimated: >60 mL/min (ref >60)
Glucose, Bld: 106 mg/dL — ABNORMAL HIGH (ref 70–99)
Potassium: 3.4 mmol/L — ABNORMAL LOW (ref 3.5–5.1)
Sodium: 118 mmol/L — CL (ref 135–145)
Total Bilirubin: 1 mg/dL (ref 0.3–1.2)
Total Protein: 6.5 g/dL (ref 6.5–8.1)

## 2022-07-17 LAB — LIPASE, BLOOD: Lipase: 41 U/L (ref 11–51)

## 2022-07-17 LAB — SARS CORONAVIRUS 2 BY RT PCR: SARS Coronavirus 2 by RT PCR: NEGATIVE

## 2022-07-17 LAB — BASIC METABOLIC PANEL
Anion gap: 10 (ref 5–15)
BUN: 13 mg/dL (ref 8–23)
CO2: 28 mmol/L (ref 22–32)
Calcium: 8.3 mg/dL — ABNORMAL LOW (ref 8.9–10.3)
Chloride: 83 mmol/L — ABNORMAL LOW (ref 98–111)
Creatinine, Ser: 0.74 mg/dL (ref 0.44–1.00)
GFR, Estimated: >60 mL/min (ref >60)
Glucose, Bld: 101 mg/dL — ABNORMAL HIGH (ref 70–99)
Potassium: 3.2 mmol/L — ABNORMAL LOW (ref 3.5–5.1)
Sodium: 121 mmol/L — ABNORMAL LOW (ref 135–145)

## 2022-07-17 LAB — PHOSPHORUS: Phosphorus: 3.1 mg/dL (ref 2.5–4.6)

## 2022-07-17 LAB — LACTIC ACID, PLASMA
Lactic Acid, Venous: 1 mmol/L (ref 0.5–1.9)
Lactic Acid, Venous: 0.9 mmol/L (ref 0.5–1.9)

## 2022-07-17 LAB — OSMOLALITY: Osmolality: 257 mOsm/kg — ABNORMAL LOW (ref 275–295)

## 2022-07-17 LAB — SODIUM, URINE, RANDOM: Sodium, Ur: 11 mmol/L

## 2022-07-17 LAB — BRAIN NATRIURETIC PEPTIDE: B Natriuretic Peptide: 215 pg/mL — ABNORMAL HIGH (ref 0.0–100.0)

## 2022-07-17 LAB — OSMOLALITY, URINE: Osmolality, Ur: 153 mOsm/kg — ABNORMAL LOW (ref 300–900)

## 2022-07-17 LAB — MAGNESIUM: Magnesium: 1.9 mg/dL (ref 1.7–2.4)

## 2022-07-17 MED ORDER — OYSTER SHELL CALCIUM/D3 500-5 MG-MCG PO TABS
1.0000 | ORAL_TABLET | Freq: Two times a day (BID) | ORAL | Status: DC
  Administered 2022-07-17 – 2022-07-24 (×14): 1 via ORAL
  Filled 2022-07-17 (×14): qty 1

## 2022-07-17 MED ORDER — ONDANSETRON HCL 4 MG/2ML IJ SOLN: 4.0000 mg | Freq: Three times a day (TID) | INTRAMUSCULAR | Status: DC | PRN

## 2022-07-17 MED ORDER — DIPHENHYDRAMINE HCL 50 MG/ML IJ SOLN: 12.5000 mg | Freq: Three times a day (TID) | INTRAMUSCULAR | Status: DC | PRN

## 2022-07-17 MED ORDER — LORATADINE 10 MG PO TABS: 10.0000 mg | ORAL_TABLET | Freq: Every day | ORAL | Status: DC | PRN

## 2022-07-17 MED ORDER — SODIUM CHLORIDE 0.9 % IV SOLN
1.0000 g | Freq: Once | INTRAVENOUS | Status: AC
  Administered 2022-07-17: 1 g via INTRAVENOUS
  Filled 2022-07-17: qty 10

## 2022-07-17 MED ORDER — MAGNESIUM SULFATE IN D5W 1-5 GM/100ML-% IV SOLN
1.0000 g | Freq: Once | INTRAVENOUS | Status: AC
  Administered 2022-07-17: 1 g via INTRAVENOUS
  Filled 2022-07-17: qty 100

## 2022-07-17 MED ORDER — AMLODIPINE BESYLATE 5 MG PO TABS
5.0000 mg | ORAL_TABLET | Freq: Every day | ORAL | Status: DC
  Administered 2022-07-17 – 2022-07-18 (×2): 5 mg via ORAL
  Filled 2022-07-17 (×2): qty 1

## 2022-07-17 MED ORDER — AMIODARONE HCL 200 MG PO TABS
100.0000 mg | ORAL_TABLET | Freq: Every day | ORAL | Status: DC
  Administered 2022-07-18 – 2022-07-24 (×7): 100 mg via ORAL
  Filled 2022-07-17 (×8): qty 1

## 2022-07-17 MED ORDER — APIXABAN 2.5 MG PO TABS: 2.5000 mg | ORAL_TABLET | Freq: Every day | ORAL | Status: DC

## 2022-07-17 MED ORDER — HYDRALAZINE HCL 20 MG/ML IJ SOLN
10.0000 mg | INTRAMUSCULAR | Status: DC | PRN
  Administered 2022-07-17: 10 mg via INTRAVENOUS
  Filled 2022-07-17: qty 1

## 2022-07-17 MED ORDER — ALBUTEROL SULFATE (2.5 MG/3ML) 0.083% IN NEBU: 2.5000 mg | INHALATION_SOLUTION | RESPIRATORY_TRACT | Status: DC | PRN

## 2022-07-17 MED ORDER — APIXABAN 2.5 MG PO TABS
2.5000 mg | ORAL_TABLET | Freq: Once | ORAL | Status: AC
  Administered 2022-07-17: 2.5 mg via ORAL
  Filled 2022-07-17: qty 1

## 2022-07-17 MED ORDER — SODIUM CHLORIDE 0.9 % IV SOLN: INTRAVENOUS | Status: DC

## 2022-07-17 MED ORDER — MOMETASONE FURO-FORMOTEROL FUM 100-5 MCG/ACT IN AERO
2.0000 | INHALATION_SPRAY | Freq: Two times a day (BID) | RESPIRATORY_TRACT | Status: DC
  Administered 2022-07-18 – 2022-07-24 (×13): 2 via RESPIRATORY_TRACT
  Filled 2022-07-17: qty 8.8

## 2022-07-17 MED ORDER — DOXYCYCLINE HYCLATE 100 MG PO TABS
100.0000 mg | ORAL_TABLET | Freq: Two times a day (BID) | ORAL | Status: AC
  Administered 2022-07-18 – 2022-07-22 (×9): 100 mg via ORAL
  Filled 2022-07-17 (×9): qty 1

## 2022-07-17 MED ORDER — POTASSIUM CHLORIDE IN NACL 20-0.9 MEQ/L-% IV SOLN
INTRAVENOUS | Status: DC
  Filled 2022-07-17: qty 1000

## 2022-07-17 MED ORDER — SODIUM CHLORIDE 1 G PO TABS
1.0000 g | ORAL_TABLET | Freq: Two times a day (BID) | ORAL | Status: DC
  Administered 2022-07-17 – 2022-07-24 (×13): 1 g via ORAL
  Filled 2022-07-17 (×15): qty 1

## 2022-07-17 MED ORDER — ACETAMINOPHEN 325 MG PO TABS
325.0000 mg | ORAL_TABLET | Freq: Four times a day (QID) | ORAL | Status: DC | PRN
  Administered 2022-07-20: 325 mg via ORAL
  Filled 2022-07-17: qty 1

## 2022-07-17 MED ORDER — HYDRALAZINE HCL 20 MG/ML IJ SOLN: 5.0000 mg | INTRAMUSCULAR | Status: DC | PRN

## 2022-07-17 MED ORDER — METOPROLOL TARTRATE 50 MG PO TABS
100.0000 mg | ORAL_TABLET | Freq: Once | ORAL | Status: AC
  Administered 2022-07-17: 100 mg via ORAL
  Filled 2022-07-17: qty 2

## 2022-07-17 MED ORDER — DOXYCYCLINE HYCLATE 100 MG PO TABS
100.0000 mg | ORAL_TABLET | Freq: Once | ORAL | Status: AC
  Administered 2022-07-17: 100 mg via ORAL
  Filled 2022-07-17: qty 1

## 2022-07-17 MED ORDER — POTASSIUM CHLORIDE CRYS ER 20 MEQ PO TBCR
40.0000 meq | EXTENDED_RELEASE_TABLET | Freq: Once | ORAL | Status: AC
  Administered 2022-07-17: 40 meq via ORAL
  Filled 2022-07-17: qty 2

## 2022-07-17 MED ORDER — METOPROLOL TARTRATE 50 MG PO TABS
50.0000 mg | ORAL_TABLET | Freq: Every day | ORAL | Status: DC
  Administered 2022-07-17 – 2022-07-23 (×7): 50 mg via ORAL
  Filled 2022-07-17 (×8): qty 1

## 2022-07-17 MED ORDER — SODIUM CHLORIDE 0.9 % IV SOLN
1.0000 g | INTRAVENOUS | Status: AC
  Administered 2022-07-18 – 2022-07-21 (×4): 1 g via INTRAVENOUS
  Filled 2022-07-17 (×5): qty 10

## 2022-07-17 MED ORDER — DM-GUAIFENESIN ER 30-600 MG PO TB12: 1.0000 | ORAL_TABLET | Freq: Two times a day (BID) | ORAL | Status: DC | PRN

## 2022-07-17 MED ORDER — APIXABAN 2.5 MG PO TABS
2.5000 mg | ORAL_TABLET | Freq: Two times a day (BID) | ORAL | Status: DC
  Administered 2022-07-18 – 2022-07-24 (×13): 2.5 mg via ORAL
  Filled 2022-07-17 (×13): qty 1

## 2022-07-17 MED ORDER — ONDANSETRON HCL 4 MG/2ML IJ SOLN
4.0000 mg | Freq: Once | INTRAMUSCULAR | Status: AC
  Administered 2022-07-17: 4 mg via INTRAVENOUS
  Filled 2022-07-17: qty 2

## 2022-07-17 MED ORDER — POLYVINYL ALCOHOL 1.4 % OP SOLN: 1.0000 [drp] | Freq: Four times a day (QID) | OPHTHALMIC | Status: DC | PRN

## 2022-07-17 MED ORDER — SODIUM CHLORIDE 0.9 % IV BOLUS
1000.0000 mL | Freq: Once | INTRAVENOUS | Status: AC
  Administered 2022-07-17: 1000 mL via INTRAVENOUS

## 2022-07-17 NOTE — ED Notes (Signed)
US at bedside

## 2022-07-17 NOTE — ED Triage Notes (Signed)
Pt with family. As per pt/family, pt has pneumonia and was seen yesterday and was told it is getting worse. Pt states being sick to the stomach, unable to keep anything down. Pt reports nausea and vomiting. Family states negative covid test was yesterday. Pt reports being treated at home with antibiotics.

## 2022-07-17 NOTE — ED Provider Triage Note (Signed)
Emergency Medicine Provider Triage Evaluation Note  Santresa Levett , a 88 y.o. female  was evaluated in triage.  Pt complains of pneumonia for 1 month. Was told yesterday x-ray worse.  Vomiting +  Review of Systems  Positive: + vomting, sob, weak Negative: No known fever  Physical Exam  BP (!) 186/70   Pulse 64   Temp (!) 97.4 F (36.3 C) (Oral)   Resp 20   SpO2 100%  Gen:   Awake, no distress  talkative Resp:  Normal effort  MSK:   Moves extremities without difficulty  Other:    Medical Decision Making  Medically screening exam initiated at 12:48 PM.  Appropriate orders placed.  Kalani Baray was informed that the remainder of the evaluation will be completed by another provider, this initial triage assessment does not replace that evaluation, and the importance of remaining in the ED until their evaluation is complete.     Johnn Hai, PA-C 07/17/22 1250

## 2022-07-17 NOTE — H&P (Signed)
History and Physical    [name] HKV:[id number] DOB: 05-29-1927 DOA: 07/17/2022  Referring MD/NP/PA:   PCP: Crystal Aus, MD   Patient coming from:  The patient is coming from home.  At baseline, pt is independent for most of ADL.        Chief Complaint: Cough, nausea, vomiting  HPI: [name] is a 88 y.o. female with medical history significant of hypertension, hyperlipidemia, GERD, depression, CHF, atrial fibrillation on Eliquis, CAD, non-STEMI, who presents with cough, nausea, vomiting.  Patient states that she has cough and possible pneumonia for almost 1 month.  She was treated with doxycycline and prednisone recently, without significant improvement.  He continues to have productive cough with yellow-colored sputum production.  Denies chest pain or shortness breath.  Patient had subjective fever yesterday, no chills.  Her body temperature is 97.4 today. She also reports nausea, vomiting and mild diarrhea for 2 days.  She states that she has multiple episodes of nonbilious nonbloody vomiting each day.  She has mild diarrhea.  No abdominal pain.  Denies symptoms of UTI.  Patient does not have confusion.  Mental status is normal.  Patient had negative COVID test in clinic as outpatient yesterday. Per her niece, she fell a weak ago and has bruises in her back.   Data reviewed independently and ED Course: pt was found to have sodium 118, BNP 215, potassium 3.4, magnesium 1.9, GFR> 60, mild abnormal liver function (ALP 102, AST 63, ALT 81, total bilirubin 1.0), lactic acid 1.0, INR 1.1, lipase 41.  Temperature 97.4, blood pressure 156/89, 214/75, heart rate 72, RR 22, oxygen saturation 94-99% on room air.  Chest x-ray showed mild infiltration in right upper lobe.  Right upper quadrant ultrasound showed gallstone without evidence of cholecystitis.  Patient is admitted to PCU as inpatient.  Dr. Theador Hawthorne of nephrology is consulted.  US-RUQ: 1. Cholelithiasis without sonographic evidence of  acute cholecystitis. 2. Hepatic morphologic changes suggestive of cirrhosis.  EKG: I have personally reviewed.  Sinus rhythm, QTc 531, PAC, nonspecific T wave change   Review of Systems:   General: no fevers, chills, no body weight gain, has poor appetite, has fatigue HEENT: no blurry vision, hearing changes or sore throat Respiratory: no dyspnea, has coughing, no wheezing CV: no chest pain, no palpitations GI: has nausea, vomiting, diarrhea, no constipation abdominal pain GU: no dysuria, burning on urination, increased urinary frequency, hematuria  Ext: has trace leg edema Neuro: no unilateral weakness, numbness, or tingling, no vision change or hearing loss Skin: no rash, no skin tear. MSK: No muscle spasm, no deformity, no limitation of range of movement in spin Heme: No easy bruising.  Travel history: No recent long distant travel.   Allergy:  Allergies  Allergen Reactions   Amoxicillin-Pot Clavulanate Nausea Only   Cefprozil Nausea Only   Clarithromycin Nausea Only   Etodolac Diarrhea   Prednisone     Other reaction(s): Other (See Comments) Hyperglycemia   Pseudoephedrine     Other reaction(s): Unknown   Penicillins Rash   Sulfa Antibiotics Rash    Past Medical History:  Diagnosis Date   Allergic rhinitis    Arrhythmia    atrial fibrillation   Arthritis    CHF (congestive heart failure) (HCC)    Colon polyp    Edema    Esophageal spasm    GERD (gastroesophageal reflux disease)    Hiatal hernia    Hypertension     Past Surgical History:  Procedure Laterality Date  APPENDECTOMY     CATARACT EXTRACTION     CORONARY STENT INTERVENTION N/A 08/30/2021   Procedure: CORONARY STENT INTERVENTION;  Surgeon: Yolonda Kida, MD;  Location: [facility];  Service: Cardiovascular;  Laterality: N/A;   dilatation and curatage     EYE SURGERY     HYSTEROTOMY     LEFT HEART CATH AND CORONARY ANGIOGRAPHY N/A 08/30/2021   Procedure: LEFT HEART CATH AND  CORONARY ANGIOGRAPHY possible PCI and stent;  Surgeon: Yolonda Kida, MD;  Location: [facility];  Service: Cardiovascular;  Laterality: N/A;   OOPHORECTOMY     TONSILLECTOMY      Social History:  reports that she has never smoked. She has never used smokeless tobacco. She reports that she does not drink alcohol and does not use drugs.  Family History:  Family History  Problem Relation Age of Onset   Dementia Mother    Heart attack Father      Prior to Admission medications   Medication Sig Start Date End Date Taking? Authorizing Provider  amiodarone (PACERONE) 200 MG tablet Take 1 tablet (200 mg total) by mouth daily. 09/07/21   Jennye Boroughs, MD  aspirin 81 MG chewable tablet Chew 1 tablet (81 mg total) by mouth daily. 09/07/21   Jennye Boroughs, MD  atorvastatin (LIPITOR) 80 MG tablet Take 1 tablet (80 mg total) by mouth daily. Patient not taking: Reported on 11/05/2021 09/07/21   Jennye Boroughs, MD  benzonatate (TESSALON) 100 MG capsule Take by mouth 3 (three) times daily as needed for cough. Patient not taking: Reported on 11/05/2021    [provider]  Calcium Carbonate-Vitamin D 600-200 MG-UNIT TABS Take 2 tablets by mouth daily.     [provider]  carboxymethylcellul-glycerin (REFRESH OPTIVE) 0.5-0.9 % ophthalmic solution Place 1 drop into both eyes 4 (four) times daily as needed for dry eyes.    [provider]  cetirizine (ZYRTEC) 10 MG tablet Take 10 mg by mouth at bedtime.    [provider]  clopidogrel (PLAVIX) 75 MG tablet Take 1 tablet (75 mg total) by mouth daily with breakfast. 09/07/21   Jennye Boroughs, MD  ELIQUIS 2.5 MG TABS tablet Take 2.5 mg by mouth 2 (two) times daily. 07/18/21   [provider]  escitalopram (LEXAPRO) 5 MG tablet Take 5 mg by mouth in the morning and at bedtime.  02/02/18   [provider]  fluticasone-salmeterol (ADVAIR) 100-50 MCG/ACT AEPB Inhale 1 puff into the lungs 2 (two) times  daily. Patient not taking: Reported on 11/05/2021    [provider]  furosemide (LASIX) 20 MG tablet TAKE 1 TABLET BY MOUTH DAILY 01/07/22   Darylene Price A, FNP  ipratropium-albuterol (DUONEB) 0.5-2.5 (3) MG/3ML SOLN Inhale 3 mLs into the lungs 3 (three) times daily. Patient not taking: Reported on 11/05/2021 07/06/20   [provider]  metoprolol tartrate (LOPRESSOR) 100 MG tablet Take 1 tablet (100 mg total) by mouth 2 (two) times daily. 09/06/21   Jennye Boroughs, MD  pantoprazole (PROTONIX) 40 MG tablet Take 40 mg by mouth daily.    [provider]  potassium chloride SA (KLOR-CON M) 20 MEQ tablet Take 1 tablet (20 mEq total) by mouth 2 (two) times daily. 09/06/21   Jennye Boroughs, MD  tiotropium (SPIRIVA) 18 MCG inhalation capsule Place 18 mcg into inhaler and inhale daily. Patient not taking: Reported on 11/05/2021    [provider]  traMADol (ULTRAM) 50 MG tablet Take by mouth  every 6 (six) hours as needed. Has not taken since 12/17 per pt's family member Patient not taking: Reported on 11/05/2021    [provider]    Physical Exam: Vitals:   07/17/22 1700 07/17/22 1730 07/17/22 1800 07/17/22 1830  BP: (!) 156/89 (!) 181/77 (!) 214/75 (!) 212/61  Pulse: 72 62 63 (!) 59  Resp: '19 13 17 14  '$ Temp:      TempSrc:      SpO2: 99% 98% 99% 97%  Weight:      Height:       General: Not in acute distress HEENT:       Eyes: PERRL, EOMI, no scleral icterus.       ENT: No discharge from the ears and nose, no pharynx injection, no tonsillar enlargement.        Neck: No JVD, no bruit, no mass felt. Heme: No neck lymph node enlargement. Cardiac: S1/S2, RRR, No murmurs, No gallops or rubs. Respiratory: No rales, wheezing, rhonchi or rubs. GI: Soft, nondistended, nontender, no rebound pain, no organomegaly, BS present. GU: No hematuria Ext: has trace leg edema bilaterally. 1+DP/PT pulse bilaterally. Musculoskeletal: No joint deformities, No joint redness or  warmth, no limitation of ROM in spin. Skin: No rashes.  Neuro: Alert, oriented X3, cranial nerves II-XII grossly intact, moves all extremities normally.  Marland Kitchen Psych: Patient is not psychotic, no suicidal or hemocidal ideation.  Labs on Admission: I have personally reviewed following labs and imaging studies  CBC: Recent Labs  Lab 07/17/22 1257  WBC 9.5  NEUTROABS 7.8*  HGB 13.5  HCT 39.1  MCV 88.5  PLT 858   Basic Metabolic Panel: Recent Labs  Lab 07/17/22 1257 07/17/22 1750 07/17/22 1816  NA 118*  --  121*  K 3.4*  --  3.2*  CL 79*  --  83*  CO2 29  --  28  GLUCOSE 106*  --  101*  BUN 16  --  13  CREATININE 0.88  --  0.74  CALCIUM 9.1  --  8.3*  MG 1.9  --   --   PHOS  --  3.1  --    GFR: Estimated Creatinine Clearance: 39.5 mL/min (by C-G formula based on SCr of 0.74 mg/dL). Liver Function Tests: Recent Labs  Lab 07/17/22 1257  AST 63*  ALT 81*  ALKPHOS 102  BILITOT 1.0  PROT 6.5  ALBUMIN 3.5   Recent Labs  Lab 07/17/22 1257  LIPASE 41   No results for input(s): "AMMONIA" in the last 168 hours. Coagulation Profile: No results for input(s): "INR", "PROTIME" in the last 168 hours. Cardiac Enzymes: No results for input(s): "CKTOTAL", "CKMB", "CKMBINDEX", "TROPONINI" in the last 168 hours. BNP (last 3 results) No results for input(s): "PROBNP" in the last 8760 hours. HbA1C: No results for input(s): "HGBA1C" in the last 72 hours. CBG: No results for input(s): "GLUCAP" in the last 168 hours. Lipid Profile: No results for input(s): "CHOL", "HDL", "LDLCALC", "TRIG", "CHOLHDL", "LDLDIRECT" in the last 72 hours. Thyroid Function Tests: No results for input(s): "TSH", "T4TOTAL", "FREET4", "T3FREE", "THYROIDAB" in the last 72 hours. Anemia Panel: No results for input(s): "VITAMINB12", "FOLATE", "FERRITIN", "TIBC", "IRON", "RETICCTPCT" in the last 72 hours. Urine analysis:    Component Value Date/Time   COLORURINE YELLOW 08/28/2021 2340   APPEARANCEUR  CLEAR 08/28/2021 2340   APPEARANCEUR Clear 02/20/2013 1404   LABSPEC 1.010 08/28/2021 2340   LABSPEC 1.006 02/20/2013 1404   PHURINE 6.0 08/28/2021 2340   GLUCOSEU NEGATIVE  08/28/2021 2340   GLUCOSEU >=500 02/20/2013 1404   HGBUR MODERATE (A) 08/28/2021 2340   BILIRUBINUR NEGATIVE 08/28/2021 2340   BILIRUBINUR Negative 02/20/2013 1404   KETONESUR TRACE (A) 08/28/2021 2340   PROTEINUR 30 (A) 08/28/2021 2340   NITRITE NEGATIVE 08/28/2021 2340   LEUKOCYTESUR NEGATIVE 08/28/2021 2340   LEUKOCYTESUR Negative 02/20/2013 1404   Sepsis Labs: '@LABRCNTIP'$ (procalcitonin:4,lacticidven:4) )No results found for this or any previous visit (from the past 240 hour(s)).   Radiological Exams on Admission: US Abdomen Limited RUQ (LIVER/GB)  Result Date: 07/17/2022 CLINICAL DATA:  One day of right upper quadrant abdominal pain. EXAM: ULTRASOUND ABDOMEN LIMITED RIGHT UPPER QUADRANT COMPARISON:  CT abdomen and pelvis March 13, 2018 FINDINGS: Gallbladder: Cholelithiasis in a nondistended gallbladder without gallbladder wall thickening. No sonographic Murphy sign noted by sonographer. Common bile duct: Diameter: 3 mm Liver: No focal lesion identified. Coarsened hepatic echotexture with nodular hepatic contour. Portal vein is patent on color Doppler imaging with normal direction of blood flow towards the liver. Other: None. IMPRESSION: 1. Cholelithiasis without sonographic evidence of acute cholecystitis. 2. Hepatic morphologic changes suggestive of cirrhosis. Electronically Signed   By: Dahlia Bailiff M.D.   On: 07/17/2022 17:19   DG Chest 2 View  Result Date: 07/17/2022 CLINICAL DATA:  Cough EXAM: CHEST - 2 VIEW COMPARISON:  08/28/2021 FINDINGS: Stable cardiomediastinal contours. Linear areas of bibasilar scarring or atelectasis. Small subtle opacity in the right upper lobe. No pleural effusion or pneumothorax. IMPRESSION: Small subtle opacity in the right upper lobe, which may reflect developing pneumonia.  Electronically Signed   By: Davina Poke D.O.   On: 07/17/2022 13:43      Assessment/Plan Principal Problem:   CAP (community acquired pneumonia) Active Problems:   Nausea vomiting and diarrhea   Hyponatremia   CAD (coronary artery disease)   Atrial fibrillation, chronic (HCC)   Chronic diastolic CHF (congestive heart failure) (HCC)   HLD (hyperlipidemia)   Hypertension   Hypokalemia   Abnormal LFTs   Fall at home, initial encounter   Depression   Prolonged QT interval   Assessment and Plan:  CAP (community acquired pneumonia): Patient does not meet security for sepsis.  Chest x-ray showed right upper lobe infiltration which is mild.  No oxygen desaturation.  -Admited to progressive unit as inpatient - IV Rocephin and doxycycline - Mucinex for cough  - Bronchodilators - Urine S. pneumococcal antigen - Follow up blood culture x2, sputum culture and covid19   Nausea vomiting and diarrhea: Etiology is not clear.  May be due to viral gastroenteritis -Check C. difficile PCR and GI pathogen panel -As needed Benadryl (cannot use Zofran due to QTc prolongation) -IV fluid  Hyponatremia: Na 118.  Mental status normal.  Likely due to GI loss, poor oral intake and dehydration  - Will check urine sodium, urine osmolality, serum osmolality. - Sodium chloride tablets 1 g twice daily - Fluid restriction - IVF: 1L NS in ED, will continue with IV normal saline at 75 mL/h - f/u by BMP q6h - avoid over correction too fast due to risk of central pontine myelinolysis - consulted Dr. Theador Hawthorne of renal  CAD (coronary artery disease): No chest pain -Continue metoprolol  Atrial fibrillation, chronic [facility] Patient had fall a week ago.  Pending CT head.  Patient was already given 2.5 mg of Eliquis by EDP.  I will order Eliquis to start at 10:00 tomorrow morning when CT head result should be back. -Continue Eliquis -Continue metoprolol, amiodarone  Chronic diastolic CHF (  congestive  heart failure) ([place]): 2D echo on 03/29/2021 showed EF of 55-60%.  Patient has trace leg edema, no JVD.  No pulm edema chest x-ray.  CHF is compensated. -Hold Lasix and metolazone due to hyponatremia -Check BNP --> 215  HLD (hyperlipidemia): Patient is not taking medications currently -Follow-up with PCP  Hypertension: Blood pressure 156/89, 214/75 -Continue metoprolol -IV hydralazine as needed -will add amlodipine 5 mg daily  Hypokalemia: Potassium 3.4.  Magnesium 1.9 -Repleted potassium -Give 1 g of magnesium sulfate due to QTc prolongation  Mild abnormal LFTs: Right upper quadrant ultrasound showed gallstone, but no evidence of cholecystitis.  Patient does not have abdominal pain. -Judicious use Tylenol (patient cannot use NSAIDs due to allergy)  Fall at home, initial encounter -Follow-up CT head -For precaution -PT/OT  Depression -Hold Lexapro due to QTc prolongation  Prolonged QT interval -Give 1 g magnesium sulfate as above -Hold Lexapro      DVT ppx: on Eliquis  Code Status: DNR (I discussed with patient and explained the meaning of CODE STATUS. Patient wants to be DNR)  Family Communication:  Yes, patient's niece  by phone  Disposition Plan:  Anticipate discharge back to previous environment  Consults called:  Dr. Theador Hawthorne of nephrology is consulted.  Admission status and Level of care: Progressive:     as inpt       Dispo: The patient is from: Home              Anticipated d/c is to: Home              Anticipated d/c date is: 2 days              Patient currently is not medically stable to d/c.    Severity of Illness:  The appropriate patient status for this patient is INPATIENT. Inpatient status is judged to be reasonable and necessary in order to provide the required intensity of service to ensure the patient's safety. The patient's presenting symptoms, physical exam findings, and initial radiographic and laboratory data in the context of their chronic  comorbidities is felt to place them at high risk for further clinical deterioration. Furthermore, it is not anticipated that the patient will be medically stable for discharge from the hospital within 2 midnights of admission.   * I certify that at the point of admission it is my clinical judgment that the patient will require inpatient hospital care spanning beyond 2 midnights from the point of admission due to high intensity of service, high risk for further deterioration and high frequency of surveillance required.*       Date of Service 07/17/2022    Ivor Costa [facility]   If 7PM-7AM, please contact night-coverage [email or web address] 07/17/2022, 7:08 PM

## 2022-07-17 NOTE — Consult Note (Addendum)
[name] MRN: [id number] DOB/AGE: 86/08/1927 86 y.o. Primary Care Physician:Miller, Christean Grief, MD Admit date: 07/17/2022 Chief Complaint:  Chief Complaint  Patient presents with   Nausea    cou   Cough    Current diagnosis of pneumonia.    HPI: Patient is a 88 year old Caucasian female with a past medical history of CHF, hypertension, GERD, arthritis who came to the ER with chief complaint of nausea and cough.  History of present illness date backs to a month ago when patient started having cough and was diagnosed with pneumonia. Patient was later on started on doxycycline and prednisone. Patient continues to have cough which was productive with yellow-colored sputum.  Patient later started having more nausea and was unable to keep anything down so she decided to come to the ER.  Upon evaluation in the ER patient was found to have hyponatremia with sodium of 118, hypokalemia with potassium 3.4, elevated LFTs.  Patient chest x-ray had shown mild infiltration in the right upper lobe.Nephrology was consulted for hyponatremia  Patient was seen in the ER Patient main concern was I have pneumonia. Patient informed me that she did not eat anything from past few days.  Patient added the detail that she only ate a bowl of cereal this morning. Patient offers no complaint of abdominal pain No complaint of change in speech or change in vision.    Past Medical History:  Diagnosis Date   Allergic rhinitis    Arrhythmia    atrial fibrillation   Arthritis    CHF (congestive heart failure) (HCC)    Colon polyp    Edema    Esophageal spasm    GERD (gastroesophageal reflux disease)    Hiatal hernia    Hypertension         Family History  Problem Relation Age of Onset   Dementia Mother    Heart attack Father     Social History:  reports that she has never smoked. She has never used smokeless tobacco. She reports that she does not drink alcohol and does not use drugs.   Allergies:   Allergies  Allergen Reactions   Amoxicillin-Pot Clavulanate Nausea Only   Cefprozil Nausea Only   Clarithromycin Nausea Only   Etodolac Diarrhea   Prednisone     Other reaction(s): Other (See Comments) Hyperglycemia   Pseudoephedrine     Other reaction(s): Unknown   Penicillins Rash   Sulfa Antibiotics Rash    (Not in a hospital admission)      LOV:FIEPP from the symptoms mentioned above,there are no other symptoms referable to all systems reviewed.   [START ON 07/18/2022] amiodarone  100 mg Oral Daily   amLODipine  5 mg Oral Daily   [START ON 07/18/2022] apixaban  2.5 mg Oral BID   calcium-vitamin D  1 tablet Oral BID   [START ON 07/18/2022] doxycycline  100 mg Oral Q12H   metoprolol tartrate  50 mg Oral Daily   [START ON 07/18/2022] mometasone-formoterol  2 puff Inhalation BID   sodium chloride  1 g Oral BID WC       Physical Exam: Vital signs in last 24 hours: Temp:  [97.4 F (36.3 C)-97.5 F (36.4 C)] 97.5 F (36.4 C) (10/19 1957) Pulse Rate:  [59-72] 59 (10/19 1930) Resp:  [11-22] 11 (10/19 1930) BP: (156-215)/(61-116) 170/116 (10/19 1930) SpO2:  [94 %-100 %] 97 % (10/19 1930) Weight:  [67.6 kg] 67.6 kg (10/19 1248) Weight change:     Intake/Output from previous day:  No intake/output data recorded. No intake/output data recorded.   Physical Exam: General- pt is awake,alert, oriented to time place and person Resp- No acute REsp distress, Rhonchi at bases CVS- S1S2 regular in rate and rhythm GIT- BS+, soft, NT, ND EXT-1+ LE Edema, Cyanosis CNS- CN 2-12 grossly intact. Moving all 4 extremities Psych- normal mood and affect    Lab Results: CBC Recent Labs    07/17/22 1257  WBC 9.5  HGB 13.5  HCT 39.1  PLT 201    BMET Recent Labs    07/17/22 1257 07/17/22 1816  NA 118* 121*  K 3.4* 3.2*  CL 79* 83*  CO2 29 28  GLUCOSE 106* 101*  BUN 16 13  CREATININE 0.88 0.74  CALCIUM 9.1 8.3*    MICRO Recent Results (from the past 240  hour(s))  SARS Coronavirus 2 by RT PCR (hospital order, performed in [facility] hospital lab) *cepheid single result test* Anterior Nasal Swab     Status: None   Collection Time: 07/17/22  6:16 PM   Specimen: Anterior Nasal Swab  Result Value Ref Range Status   SARS Coronavirus 2 by RT PCR NEGATIVE NEGATIVE Final    Comment: (NOTE) SARS-CoV-2 target nucleic acids are NOT DETECTED.  The SARS-CoV-2 RNA is generally detectable in upper and lower respiratory specimens during the acute phase of infection. The lowest concentration of SARS-CoV-2 viral copies this assay can detect is 250 copies / mL. A negative result does not preclude SARS-CoV-2 infection and should not be used as the sole basis for treatment or other patient management decisions.  A negative result may occur with improper specimen collection / handling, submission of specimen other than nasopharyngeal swab, presence of viral mutation(s) within the areas targeted by this assay, and inadequate number of viral copies (<250 copies / mL). A negative result must be combined with clinical observations, patient history, and epidemiological information.  Fact Sheet for Patients:   https://www.patel.info/  Fact Sheet for Healthcare Providers: https://hall.com/  This test is not yet approved or  cleared by the [place] FDA and has been authorized for detection and/or diagnosis of SARS-CoV-2 by FDA under an Emergency Use Authorization (EUA).  This EUA will remain in effect (meaning this test can be used) for the duration of the COVID-19 declaration under Section 564(b)(1) of the Act, 21 U.S.C. section 360bbb-3(b)(1), unless the authorization is terminated or revoked sooner.  Performed at [facility], [address] [place], [place] [address]       Lab Results  Component Value Date   CALCIUM 8.3 (L) 07/17/2022   PHOS 3.1 07/17/2022      Impression:   1)Renal     Hyponatremia Patient has multifactorial hyponatremia Patient has a component of euvolemic hyponatremia and Patient has hypovolemic hyponatremia.  Data in favor of hypovolemic hyponatremia is Patient serum osmolality is 257 Patient urine osmolality is 153 Patient urine sodium is low at 11  Patient low urine sodium shows that the patient is hypovolemic.  It also correlates with patient's recent history of not eating/not able to keep anything down.  Patient low urine osmolality could be from SIADH/euvolemic hyponatremia as patient is on SSRI as an outpatient And patient's pulmonary issues  Patient predominant component is hypovolemic hyponatremia as patient sodium has responded well to IV fluids  Patient sodium is improving Goal of patient sodium correction is to corrected at less than 8 mEq per 24 hours  No mental status changes Patient does not need 3% saline  2)Hypertension  Patient blood pressure well controlled Upon evaluation patient SBP was less than 140   3)Hypokalemia Patient is hypokalemia secondary to diuretics and poor p.o. intake Patient was on Lasix and metolazone as an outpatient Will we will replete patient's potassium  4)Community-acquired pneumonia Patient is on IV antibiotics Primary team is following   5)Chronic diastolic CHF Patient BNP is indeed elevated at 215 which goes against patient being hypovolemic but looking at patient's history in the past patient has had BNP of 1000. We will follow   6)Atrial fibrillation Patient is on Eliquis and amiodarone   7)Elevated LFTs Patient AST/ALT were elevated Primary team is following    Plan:   Patient does not need 3% saline. We will change patient's IV fluids to normal saline with potassium in an effort to help with hypokalemia replacement .   Patient potassium replacement does help with hyponatremia as well We will follow patient closely      Jamil Armwood s Tekeyah Santiago 07/17/2022, 8:21 PM

## 2022-07-17 NOTE — ED Notes (Addendum)
Lab called with a critical lab, Na of 118. Dr. Archie Balboa notified. Agricultural consultant notified

## 2022-07-17 NOTE — ED Provider Notes (Signed)
[facility] Provider Note    Event Date/Time   First MD Initiated Contact with Patient 07/17/22 1543     (approximate)   History   Nausea (cou) and Cough (Current diagnosis of pneumonia. )   HPI  [name] is a 88 y.o. female   Past medical history of fibrillation on Eliquis, hypertension, CHF, CAD, who presents to the emergency department with productive cough, nausea and vomiting ongoing for approximately 1 month.  She was seen by her outside provider at the end of September and was prescribed doxycycline and prednisone for sinus congestion and presumed pneumonia at that time.  She was then seen by her doctor about 1 week ago for follow-up and was ordered for chest x-ray and documentation they are suspicious for viral infection.  Today she states that her symptoms have been ongoing and she has been having very poor p.o. intake with increased nausea and vomiting over the last 2 days.  She continues to have a cough productive sputum shortness of breath about denies chest pain or abdominal pain.  She has had some diarrhea without blood or melena, no dysuria.  History was obtained via the patient and review of external medical notes including primary care visit on care everywhere last week      Physical Exam   Triage Vital Signs: ED Triage Vitals  Enc Vitals Group     BP 07/17/22 1246 (!) 186/70     Pulse Rate 07/17/22 1246 64     Resp 07/17/22 1246 20     Temp 07/17/22 1246 (!) 97.4 F (36.3 C)     Temp Source 07/17/22 1246 Oral     SpO2 07/17/22 1246 100 %     Weight 07/17/22 1248 149 lb (67.6 kg)     Height 07/17/22 1248 '5\' 5"'$  (1.651 m)     Head Circumference --      Peak Flow --      Pain Score 07/17/22 1247 0     Pain Loc --      Pain Edu? --      Excl. in [place]? --     Most recent vital signs: Vitals:   07/17/22 1630 07/17/22 1700  BP: (!) 199/76 (!) 156/89  Pulse: 64 72  Resp: 20 19  Temp:    SpO2: 94% 99%    General: Awake, no  distress.  Hypertensive 180s over 70s, unable to take her medications this morning. CV:  Good peripheral perfusion.  Dry mucous membranes and poor skin turgor, she does have ankle edema bilaterally. Resp:  Normal effort.  No focality or wheezing Abd:  No distention.  Nontender Other:  Alert and oriented, mentation is clear, speaking in full sentences, moving all extremities   ED Results / Procedures / Treatments   Labs (all labs ordered are listed, but only abnormal results are displayed) Labs Reviewed  COMPREHENSIVE METABOLIC PANEL - Abnormal; Notable for the following components:      Result Value   Sodium 118 (*)    Potassium 3.4 (*)    Chloride 79 (*)    Glucose, Bld 106 (*)    AST 63 (*)    ALT 81 (*)    All other components within normal limits  CBC WITH DIFFERENTIAL/PLATELET - Abnormal; Notable for the following components:   Neutro Abs 7.8 (*)    Lymphs Abs 0.6 (*)    All other components within normal limits  CULTURE, BLOOD (ROUTINE X 2)  CULTURE, BLOOD (  ROUTINE X 2)  LACTIC ACID, PLASMA  MAGNESIUM  LIPASE, BLOOD  LACTIC ACID, PLASMA     I reviewed labs and they are notable for hyponatremia 118 (120 yesterday at [facility] lab) and mildly elevated LFTs at 63 and 81 for AST and ALT respectively.  EKG  ED ECG REPORT I, Lucillie Garfinkel, the attending physician, personally viewed and interpreted this ECG.   Date: 07/17/2022  EKG Time: 1633  Rate: 60  Rhythm: normal EKG, normal sinus rhythm  Axis: nl  Intervals:qtc 514  ST&T Change: no stemi    RADIOLOGY I independently reviewed and interpreted chest x-ray and see no obvious opacities or pneumothorax, however formal read by radiology reports a subtle opacity in the right upper lobe reflecting pneumonia   PROCEDURES:  Critical Care performed: Yes, see critical care procedure note(s)  .Critical Care  Performed by: Lucillie Garfinkel, MD Authorized by: Lucillie Garfinkel, MD   Critical care provider statement:    Critical care  time (minutes):  30   Critical care was necessary to treat or prevent imminent or life-threatening deterioration of the following conditions:  Metabolic crisis   Critical care was time spent personally by me on the following activities:  Development of treatment plan with patient or surrogate, discussions with consultants, evaluation of patient's response to treatment, examination of patient, ordering and review of laboratory studies, ordering and review of radiographic studies, ordering and performing treatments and interventions, pulse oximetry, re-evaluation of patient's condition and review of old charts    MEDICATIONS ORDERED IN ED: Medications  cefTRIAXone (ROCEPHIN) 1 g in sodium chloride 0.9 % 100 mL IVPB (1 g Intravenous New Bag/Given 07/17/22 1717)  sodium chloride 0.9 % bolus 1,000 mL (1,000 mLs Intravenous New Bag/Given 07/17/22 1706)  ondansetron (ZOFRAN) injection 4 mg (4 mg Intravenous Given 07/17/22 1700)  apixaban (ELIQUIS) tablet 2.5 mg (2.5 mg Oral Given 07/17/22 1659)  metoprolol tartrate (LOPRESSOR) tablet 100 mg (100 mg Oral Given 07/17/22 1659)  doxycycline (VIBRA-TABS) tablet 100 mg (100 mg Oral Given 07/17/22 1700)  potassium chloride SA (KLOR-CON M) CR tablet 40 mEq (40 mEq Oral Given 07/17/22 1711)    Consultants:  I spoke with hospitalist regarding admission and regarding care plan for this patient.   IMPRESSION / MDM / ASSESSMENT AND PLAN / ED COURSE  I reviewed the triage vital signs and the nursing notes.                              Differential diagnosis includes, but is not limited to, hyponatremia without severe symptoms, pneumonia, viral infection, cholecystitis, dehydration considered but less likely ACS or PE given no chest pain and compliance with Eliquis, uncontrolled hypertension in the setting of inability to take medications due to vomiting.   The patient is on the cardiac monitor to evaluate for evidence of arrhythmia and/or significant heart  rate changes.  MDM: Patient with evidence of pneumonia in the right upper lobe with symptoms consistent, ongoing cough with sputum production.  Will antibiosis with ceftriaxone and doxycycline, in combination with hyponatremia and mildly LFT elevation, consider legionnaires.  Without chest pain I doubt she is having ACS or PE.  She is on Eliquis.  Hypertension in the setting of not taking her antihypertensives this morning due to vomiting.  I will order her oral antihypertensives after giving her Zofran for nausea.  Hyponatremia is profound at 118 though she has no neurologic symptoms and no seizures.  This  is likely due to dehydration and poor p.o. intake in the setting of subacute nausea and vomiting, will start with 1 L saline and closely monitor sodium for overcorrection.  Nausea and vomiting with mildly LFT elevations, nontender to palpation, will order right upper quadrant ultrasound to rule out cholecystitis.  UltraSound shows cholelithiasis without evidence of cholecystitis.  Patient to be admitted to medical service for pneumonia and hyponatremia, nausea and vomiting.   Patient's presentation is most consistent with acute presentation with potential threat to life or bodily function.       FINAL CLINICAL IMPRESSION(S) / ED DIAGNOSES   Final diagnoses:  Pneumonia of right upper lobe due to infectious organism  Hyponatremia  Uncontrolled hypertension  Nausea and vomiting, unspecified vomiting type  LFT elevation     Rx / DC Orders   ED Discharge Orders     None        Note:  This document was prepared using Dragon voice recognition software and may include unintentional dictation errors.    Lucillie Garfinkel, MD 07/17/22 [id number]

## 2022-07-18 DIAGNOSIS — A084 Viral intestinal infection, unspecified: Secondary | ICD-10-CM

## 2022-07-18 DIAGNOSIS — J189 Pneumonia, unspecified organism: Secondary | ICD-10-CM | POA: Diagnosis not present

## 2022-07-18 DIAGNOSIS — E871 Hypo-osmolality and hyponatremia: Secondary | ICD-10-CM | POA: Diagnosis not present

## 2022-07-18 DIAGNOSIS — I480 Paroxysmal atrial fibrillation: Secondary | ICD-10-CM

## 2022-07-18 DIAGNOSIS — I5032 Chronic diastolic (congestive) heart failure: Secondary | ICD-10-CM | POA: Diagnosis not present

## 2022-07-18 LAB — BASIC METABOLIC PANEL
Anion gap: 6 (ref 5–15)
BUN: 10 mg/dL (ref 8–23)
CO2: 28 mmol/L (ref 22–32)
Calcium: 8.2 mg/dL — ABNORMAL LOW (ref 8.9–10.3)
Chloride: 91 mmol/L — ABNORMAL LOW (ref 98–111)
Creatinine, Ser: 0.72 mg/dL (ref 0.44–1.00)
GFR, Estimated: >60 mL/min (ref >60)
Glucose, Bld: 88 mg/dL (ref 70–99)
Potassium: 3.8 mmol/L (ref 3.5–5.1)
Sodium: 125 mmol/L — ABNORMAL LOW (ref 135–145)

## 2022-07-18 LAB — CBC
HCT: 34.8 % — ABNORMAL LOW (ref 36.0–46.0)
Hemoglobin: 11.6 g/dL — ABNORMAL LOW (ref 12.0–15.0)
MCH: 30.4 pg (ref 26.0–34.0)
MCHC: 33.3 g/dL (ref 30.0–36.0)
MCV: 91.1 fL (ref 80.0–100.0)
Platelets: 200 10*3/uL (ref 150–400)
RBC: 3.82 MIL/uL — ABNORMAL LOW (ref 3.87–5.11)
RDW: 15.3 % (ref 11.5–15.5)
WBC: 8.7 10*3/uL (ref 4.0–10.5)
nRBC: 0 % (ref 0.0–0.2)

## 2022-07-18 LAB — STREP PNEUMONIAE URINARY ANTIGEN: Strep Pneumo Urinary Antigen: NEGATIVE

## 2022-07-18 NOTE — Hospital Course (Addendum)
[name] is a 88 y.o. female with medical history significant of hypertension, hyperlipidemia, GERD, depression, CHF, atrial fibrillation on Eliquis, CAD, non-STEMI, who presents with cough, nausea, vomiting. Chest x-ray showed mild infiltration in the right upper lobe concerning for pneumonia. Patient is started on antibiotics with Rocephin and doxycycline. Patient currently lives alone at home, she had a fall 2 weeks ago, she has generalized weakness. 10/24.  Completed antibiotics, pending nursing placement.  10/25.  Patient felt dizzy.  Patient was not orthostatic.  MRI of the brain without contrast was negative.  Scopolamine patch was discontinued.  10/26.  Patient feeling better.  Receive insurance authorization for rehab.

## 2022-07-18 NOTE — Progress Notes (Addendum)
[facility]  ROUNDING NOTE   Subjective:   Patient resting on stretcher, no family at bedside Alert and oriented Continues to complain of poor appetite Remains on room air  Objective:  Vital signs in last 24 hours:  Temp:  [97.3 F (36.3 C)-98.4 F (36.9 C)] 98.2 F (36.8 C) (10/20 1103) Pulse Rate:  [58-72] 65 (10/20 1230) Resp:  [11-23] 22 (10/20 1230) BP: (108-215)/(35-116) 119/77 (10/20 1230) SpO2:  [92 %-99 %] 93 % (10/20 1230)  Weight change:  Filed Weights   07/17/22 1248  Weight: 67.6 kg    Intake/Output: I/O last 3 completed shifts: In: 1432.9 [I.V.:253.3; IV Piggyback:1179.6] Out: -    Intake/Output this shift:  No intake/output data recorded.  Physical Exam: General: NAD  Head: Normocephalic, atraumatic. Moist oral mucosal membranes  Eyes: Anicteric  Lungs:  Clear to auscultation, normal effort  Heart: Regular rate and rhythm  Abdomen:  Soft, nontender  Extremities:  No peripheral edema.  Neurologic: Nonfocal, moving all four extremities  Skin: No lesions  Access: None    Basic Metabolic Panel: Recent Labs  Lab 07/17/22 1257 07/17/22 1750 07/17/22 1816 07/18/22 0455  NA 118*  --  121* 125*  K 3.4*  --  3.2* 3.8  CL 79*  --  83* 91*  CO2 29  --  28 28  GLUCOSE 106*  --  101* 88  BUN 16  --  13 10  CREATININE 0.88  --  0.74 0.72  CALCIUM 9.1  --  8.3* 8.2*  MG 1.9  --   --   --   PHOS  --  3.1  --   --     Liver Function Tests: Recent Labs  Lab 07/17/22 1257  AST 63*  ALT 81*  ALKPHOS 102  BILITOT 1.0  PROT 6.5  ALBUMIN 3.5   Recent Labs  Lab 07/17/22 1257  LIPASE 41   No results for input(s): "AMMONIA" in the last 168 hours.  CBC: Recent Labs  Lab 07/17/22 1257 07/18/22 0455  WBC 9.5 8.7  NEUTROABS 7.8*  --   HGB 13.5 11.6*  HCT 39.1 34.8*  MCV 88.5 91.1  PLT 201 200    Cardiac Enzymes: No results for input(s): "CKTOTAL", "CKMB", "CKMBINDEX", "TROPONINI" in the last 168 hours.  BNP: Invalid  input(s): "POCBNP"  CBG: No results for input(s): "GLUCAP" in the last 168 hours.  Microbiology: Results for orders placed or performed during the hospital encounter of 07/17/22  Blood Culture (routine x 2)     Status: None (Preliminary result)   Collection Time: 07/17/22  4:34 PM   Specimen: Left Antecubital; Blood  Result Value Ref Range Status   Specimen Description LEFT ANTECUBITAL  Final   Special Requests   Final    BOTTLES DRAWN AEROBIC AND ANAEROBIC Blood Culture adequate volume   Culture   Final    NO GROWTH < 24 HOURS Performed at [facility], [address], [place], [place] [address]    Report Status PENDING  Incomplete  Blood Culture (routine x 2)     Status: None (Preliminary result)   Collection Time: 07/17/22  4:34 PM   Specimen: BLOOD RIGHT FOREARM  Result Value Ref Range Status   Specimen Description BLOOD RIGHT FOREARM  Final   Special Requests   Final    BOTTLES DRAWN AEROBIC AND ANAEROBIC Blood Culture results may not be optimal due to an inadequate volume of blood received in culture bottles   Culture  Final    NO GROWTH < 24 HOURS Performed at [facility], [address] [place], [place] [address]    Report Status PENDING  Incomplete  SARS Coronavirus 2 by RT PCR (hospital order, performed in [facility] hospital lab) *cepheid single result test* Anterior Nasal Swab     Status: None   Collection Time: 07/17/22  6:16 PM   Specimen: Anterior Nasal Swab  Result Value Ref Range Status   SARS Coronavirus 2 by RT PCR NEGATIVE NEGATIVE Final    Comment: (NOTE) SARS-CoV-2 target nucleic acids are NOT DETECTED.  The SARS-CoV-2 RNA is generally detectable in upper and lower respiratory specimens during the acute phase of infection. The lowest concentration of SARS-CoV-2 viral copies this assay can detect is 250 copies / mL. A negative result does not preclude SARS-CoV-2 infection and should not be used as the sole basis for treatment or  other patient management decisions.  A negative result may occur with improper specimen collection / handling, submission of specimen other than nasopharyngeal swab, presence of viral mutation(s) within the areas targeted by this assay, and inadequate number of viral copies (<250 copies / mL). A negative result must be combined with clinical observations, patient history, and epidemiological information.  Fact Sheet for Patients:   https://www.patel.info/  Fact Sheet for Healthcare Providers: https://hall.com/  This test is not yet approved or  cleared by the [place] FDA and has been authorized for detection and/or diagnosis of SARS-CoV-2 by FDA under an Emergency Use Authorization (EUA).  This EUA will remain in effect (meaning this test can be used) for the duration of the COVID-19 declaration under Section 564(b)(1) of the Act, 21 U.S.C. section 360bbb-3(b)(1), unless the authorization is terminated or revoked sooner.  Performed at [facility], [address] [place], [place] [address]     Coagulation Studies: No results for input(s): "LABPROT", "INR" in the last 72 hours.  Urinalysis: No results for input(s): "COLORURINE", "LABSPEC", "PHURINE", "GLUCOSEU", "HGBUR", "BILIRUBINUR", "KETONESUR", "PROTEINUR", "UROBILINOGEN", "NITRITE", "LEUKOCYTESUR" in the last 72 hours.  Invalid input(s): "APPERANCEUR"    Imaging: CT HEAD WO CONTRAST (5MM)  Result Date: 07/17/2022 CLINICAL DATA:  Nausea and poor appetite. EXAM: CT HEAD WITHOUT CONTRAST TECHNIQUE: Contiguous axial images were obtained from the base of the skull through the vertex without intravenous contrast. RADIATION DOSE REDUCTION: This exam was performed according to the departmental dose-optimization program which includes automated exposure control, adjustment of the mA and/or kV according to patient size and/or use of iterative reconstruction technique.  COMPARISON:  CT head without and with contrast 03/10/2013. FINDINGS: Brain: There is mild-to-moderate cerebral atrophy and atrophic ventriculomegaly, moderately developed small-vessel disease in the cerebral white matter, findings slightly progressed from 2014. There is minimal cerebellar atrophy which is unchanged. No new asymmetry is seen concerning for an acute cortical based infarct, hemorrhage, mass or midline shift. Basal cisterns are clear. Vascular: There are patchy calcifications of the carotid siphons but no hyperdense central vessels. Skull: Negative for fractures or focal lesions.  Mild osteopenia. Sinuses/Orbits: There are old lens extractions, otherwise unremarkable orbits and orbital contents. There is patchy membrane thickening in the ethmoid air cells, mild membrane disease with mild fluid in the right sphenoid air cell. Other visualized sinuses, bilateral mastoid air cells and middle ears are clear. Other: Dystrophic benign dural calcifications are scattered along the falx, as before. IMPRESSION: Chronic changes.  No acute intracranial CT findings. Ethmoid and right sphenoid sinus disease, including with fluid in the right sphenoid  sinus. Electronically Signed   By: Telford Nab M.D.   On: 07/17/2022 20:06   US Abdomen Limited RUQ (LIVER/GB)  Result Date: 07/17/2022 CLINICAL DATA:  One day of right upper quadrant abdominal pain. EXAM: ULTRASOUND ABDOMEN LIMITED RIGHT UPPER QUADRANT COMPARISON:  CT abdomen and pelvis March 13, 2018 FINDINGS: Gallbladder: Cholelithiasis in a nondistended gallbladder without gallbladder wall thickening. No sonographic Murphy sign noted by sonographer. Common bile duct: Diameter: 3 mm Liver: No focal lesion identified. Coarsened hepatic echotexture with nodular hepatic contour. Portal vein is patent on color Doppler imaging with normal direction of blood flow towards the liver. Other: None. IMPRESSION: 1. Cholelithiasis without sonographic evidence of acute  cholecystitis. 2. Hepatic morphologic changes suggestive of cirrhosis. Electronically Signed   By: Dahlia Bailiff M.D.   On: 07/17/2022 17:19   DG Chest 2 View  Result Date: 07/17/2022 CLINICAL DATA:  Cough EXAM: CHEST - 2 VIEW COMPARISON:  08/28/2021 FINDINGS: Stable cardiomediastinal contours. Linear areas of bibasilar scarring or atelectasis. Small subtle opacity in the right upper lobe. No pleural effusion or pneumothorax. IMPRESSION: Small subtle opacity in the right upper lobe, which may reflect developing pneumonia. Electronically Signed   By: Davina Poke D.O.   On: 07/17/2022 13:43     Medications:    cefTRIAXone (ROCEPHIN)  IV      amiodarone  100 mg Oral Daily   amLODipine  5 mg Oral Daily   apixaban  2.5 mg Oral BID   calcium-vitamin D  1 tablet Oral BID   doxycycline  100 mg Oral Q12H   metoprolol tartrate  50 mg Oral Daily   mometasone-formoterol  2 puff Inhalation BID   sodium chloride  1 g Oral BID WC   acetaminophen, albuterol, dextromethorphan-guaiFENesin, diphenhydrAMINE, hydrALAZINE, loratadine, polyvinyl alcohol  Assessment/ Plan:  [name] is a 88 y.o.  female with a past medical history of CHF, hypertension, GERD, arthritis who came to the ER with chief complaint of nausea and cough.  Hyponatremia appears to be multifactorial with hypovolemia and SIADH component. Prescribe SSRI outpatient with pulmonary history. Serum Osm 257, Urine Osm 153 with urine sodium 11. Sodium level responding well to IVF. Sodium 125 today. IVF stopped and patient placed on salt tabs . Goal sodium 130 before considering discharge.   2. Hypertension, essential. Blood pressure well controlled  3. Hypokalemia  likely due to diuresis and poor oral intake. Potassium corrected to 3.8 today.  4. Community acquired pneumonia , continue IV antibiotics managed by primary team.  5. Chronic diastolic heart failure with elevated BNP. Stable at this time  6. Atrial fibrillation,  receiving amiodarone and eliquis  7. Elevated LFTs, liver enzymes elevated. Primary team managing.      LOS: [place] 10/20/20231:59 PM

## 2022-07-18 NOTE — Evaluation (Signed)
Physical Therapy Evaluation Patient Details Name: [name] MRN: [id number] DOB: Oct 01, 1926 Today's Date: 07/18/2022  History of Present Illness  Per MD: [name] is a 88 y.o. female with past medical history of fibrillation on Eliquis, hypertension, CHF, CAD, who presents to the emergency department with productive cough, nausea and vomiting ongoing for approximately 1 month.  She was seen by her outside provider at the end of September and was prescribed doxycycline and prednisone for sinus congestion and presumed pneumonia at that time.  She was then seen by her doctor about 1 week ago for follow-up and was ordered for chest x-ray and documentation they are suspicious for viral infection.   Clinical Impression  Pt received in Semi-Fowler's position and agreeable to therapy.  Pt somewhat lethargic upon arrival, but responds well to therapist and perks up following light being turned on.  Pt noted to be saturated in bed, so bedding was changed.  Pt is able to perform bed mobility requiring assistance from bed rails, but is then able to come up into sitting position.  Pt requires some assistance for bedroom slipper donning due to height of bed.  Pt then performed gait around the room due to having enteric precautions, did not leave room.  Pt then transitioned back to bed.  Pt required minA for LE navigation back into the bed.  Pt does have some edema in the LE's, but is wearing stocking.  Pt reports she has 24/7 support from family if needed, however would like to verify prior to d/c home with HHPT.  If she does not have the assistance needed for safe d/c home, then would recommend SNF.          Recommendations for follow up therapy are one component of a multi-disciplinary discharge planning process, led by the attending physician.  Recommendations may be updated based on patient status, additional functional criteria and insurance authorization.  Follow Up Recommendations Home health PT (only  home with HHPT if pt has 24/7 support like she reports)      Assistance Recommended at Discharge Frequent or constant Supervision/Assistance  Patient can return home with the following  A little help with walking and/or transfers;A little help with bathing/dressing/bathroom;Assist for transportation;Help with stairs or ramp for entrance    Equipment Recommendations None recommended by PT  Recommendations for Other Services       Functional Status Assessment Patient has had a recent decline in their functional status and demonstrates the ability to make significant improvements in function in a reasonable and predictable amount of time.     Precautions / Restrictions Precautions Precautions: Fall Restrictions Weight Bearing Restrictions: No      Mobility  Bed Mobility Overal bed mobility: Needs Assistance Bed Mobility: Supine to Sit, Sit to Supine     Supine to sit: Min guard Sit to supine: Min assist   General bed mobility comments: heavy use of the bedrails to come upright into sitting; requires minA for LE navigation into the bed.    Transfers Overall transfer level: Needs assistance Equipment used: Rolling walker (2 wheels) Transfers: Sit to/from Stand Sit to Stand: Min guard           General transfer comment: needs assistance with bedroom slipper placement due to height of bed.    Ambulation/Gait Ambulation/Gait assistance: Min guard Gait Distance (Feet): 30 Feet Assistive device: Rolling walker (2 wheels) Gait Pattern/deviations: Step-through pattern, Trunk flexed Gait velocity: decreased     General Gait Details: slow but steady ambulation  with forward flexed trunk  Stairs            Wheelchair Mobility    Modified Rankin (Stroke Patients Only)       Balance Overall balance assessment: Needs assistance Sitting-balance support: No upper extremity supported, Feet unsupported Sitting balance-Leahy Scale: Good     Standing balance  support: Bilateral upper extremity supported, During functional activity Standing balance-Leahy Scale: Fair                               Pertinent Vitals/Pain Pain Assessment Pain Assessment: 0-10 Pain Score: 4  Pain Location: L Ankle Pain Descriptors / Indicators: Aching Pain Intervention(s): Limited activity within patient's tolerance, Monitored during session, Repositioned    Home Living Family/patient expects to be discharged to:: Private residence Living Arrangements: Alone Available Help at Discharge: Family;Available 24 hours/day Type of Home: House Home Access: Stairs to enter Entrance Stairs-Rails: None Entrance Stairs-Number of Steps: 1   Home Layout: One level Home Equipment: Cane - single point;BSC/3in1;Rollator (4 wheels)      Prior Function Prior Level of Function : Independent/Modified Independent             Mobility Comments: Mod indep with household and community mobilization; rollator for community distances.  Denies fall history; no home O2. ADLs Comments: Indep with ADLs     Hand Dominance   Dominant Hand: Right    Extremity/Trunk Assessment   Upper Extremity Assessment Upper Extremity Assessment: Generalized weakness    Lower Extremity Assessment Lower Extremity Assessment: Generalized weakness       Communication   Communication: No difficulties  Cognition Arousal/Alertness: Awake/alert Behavior During Therapy: WFL for tasks assessed/performed Overall Cognitive Status: Within Functional Limits for tasks assessed                                          General Comments      Exercises Total Joint Exercises Ankle Circles/Pumps: AROM, Strengthening, Both, 10 reps, Supine Quad Sets: AROM, Strengthening, Both, 10 reps, Supine Gluteal Sets: AROM, Strengthening, Both, 10 reps, Supine Hip ABduction/ADduction: AROM, Strengthening, Both, 10 reps, Supine Straight Leg Raises: AROM, Strengthening, Both, 10  reps, Supine   Assessment/Plan    PT Assessment Patient needs continued PT services  PT Problem List Decreased strength;Decreased activity tolerance;Decreased balance;Decreased mobility;Decreased knowledge of use of DME;Decreased safety awareness;Pain       PT Treatment Interventions DME instruction;Gait training;Stair training;Functional mobility training;Therapeutic activities;Therapeutic exercise;Balance training;Neuromuscular re-education    PT Goals (Current goals can be found in the Care Plan section)  Acute Rehab PT Goals Patient Stated Goal: to go back home. PT Goal Formulation: With patient Time For Goal Achievement: 08/01/22 Potential to Achieve Goals: Good    Frequency Min 2X/week     Co-evaluation               AM-PAC PT "6 Clicks" Mobility  Outcome Measure Help needed turning from your back to your side while in a flat bed without using bedrails?: A Lot Help needed moving from lying on your back to sitting on the side of a flat bed without using bedrails?: A Lot Help needed moving to and from a bed to a chair (including a wheelchair)?: A Little Help needed standing up from a chair using your arms (e.g., wheelchair or bedside chair)?: A Little Help needed to  walk in hospital room?: A Little Help needed climbing 3-5 steps with a railing? : A Little 6 Click Score: 16    End of Session Equipment Utilized During Treatment: Gait belt Activity Tolerance: Patient tolerated treatment well Patient left: in bed;with call bell/phone within reach;with nursing/sitter in room Nurse Communication: Mobility status PT Visit Diagnosis: Unsteadiness on feet (R26.81);Other abnormalities of gait and mobility (R26.89);Muscle weakness (generalized) (M62.81);History of falling (Z91.81);Difficulty in walking, not elsewhere classified (R26.2);Pain Pain - Right/Left:  (Bil) Pain - part of body: Ankle and joints of foot    Time: 2637-8588 PT Time Calculation (min) (ACUTE ONLY): 36  min   Charges:   PT Evaluation $PT Eval Low Complexity: 1 Low PT Treatments $Gait Training: 8-22 mins        Gwenlyn Saran, PT, DPT Physical Therapist- [facility]  07/18/22, 1:47 PM  07/18/2022, 1:47 PM

## 2022-07-18 NOTE — Progress Notes (Signed)
TOC consult acknowledged for HH/DME needs, pending PT/OT evals for appropriate dispo recommendations.   [place], [place]

## 2022-07-18 NOTE — Progress Notes (Addendum)
  Progress Note   Patient: [name] ION:[id number] DOB: [birth year] DOA: 07/17/2022     1 DOS: the patient was seen and examined on 07/18/2022   Brief hospital course: Dalyla Chui is a 88 y.o. female with medical history significant of hypertension, hyperlipidemia, GERD, depression, CHF, atrial fibrillation on Eliquis, CAD, non-STEMI, who presents with cough, nausea, vomiting. Chest x-ray showed mild infiltration in the right upper lobe concerning for pneumonia. Patient is started on antibiotics with Rocephin and doxycycline. Patient currently lives alone at home, she had a fall 2 weeks ago, she has generalized weakness.  Assessment and Plan: Right upper lobe community acquired pneumonia  Patient currently does not have hypoxemia, not sepsis. Blood cultures are pending, continue antibiotics with Rocephin and doxycycline.  Acute gastroenteritis. Hyponatremia secondary to dehydration. Hypokalemia secondary to nausea vomiting diarrhea. Patient nausea vomiting is better, no additional diarrhea today. She received IV fluids, no longer feels dehydrated.  Discontinue IV fluids, she is also on salt tablets started yesterday.  Sodium level gradually improving. Potassium also normalized.  Generalized weakness and fall. Patient had a fall 2 weeks ago, she also has some weakness.  Continue PT/OT.  Chronic diastolic congestive heart failure No evidence of exacerbation.  Paroxysmal atrial fibrillation. Currently in sinus rhythm, continue anticoagulation.  Essential hypertension. On metoprolol, also added and amlodipine.  Prolonged QT interval. Continue to monitor.     Subjective:  Patient no longer has any nausea vomiting today.  He does not feel short of breath.  Physical Exam: Vitals:   07/18/22 0631 07/18/22 0700 07/18/22 0730 07/18/22 0928  BP:  (!) 142/54 (!) 136/48 (!) 110/35  Pulse:  (!) 58 (!) 58 60  Resp:  18 15   Temp: 98.4 F (36.9 C)     TempSrc: Oral     SpO2:  93%  93%   Weight:      Height:       General exam: Appears calm and comfortable, frail Respiratory system: Clear to auscultation. Respiratory effort normal. Cardiovascular system: S1 & S2 heard, RRR. No JVD, murmurs, rubs, gallops or clicks. No pedal edema. Gastrointestinal system: Abdomen is nondistended, soft and nontender. No organomegaly or masses felt. Normal bowel sounds heard. Central nervous system: Alert and oriented. No focal neurological deficits. Extremities: Symmetric 5 x 5 power. Skin: No rashes, lesions or ulcers Psychiatry: Mood & affect appropriate.   Data Reviewed:  Lab results and chest x-ray results reviewed.  Family Communication: Niece updated  Disposition: Status is: Inpatient Remains inpatient appropriate because:   Planned Discharge Destination:  TBD    Time spent: 50 minutes  Author: Sharen Hones, MD 07/18/2022 10:44 AM  For on call review [email or web address].

## 2022-07-18 NOTE — Evaluation (Signed)
Occupational Therapy Evaluation Patient Details Name: [name] MRN: [id number] DOB: 02-15-1927 Today's Date: 07/18/2022   History of Present Illness Per MD: [name] is a 88 y.o. female with past medical history of fibrillation on Eliquis, hypertension, CHF, CAD, who presents to the emergency department with productive cough, nausea and vomiting ongoing for approximately 1 month.  She was seen by her outside provider at the end of September and was prescribed doxycycline and prednisone for sinus congestion and presumed pneumonia at that time.  She was then seen by her doctor about 1 week ago for follow-up and was ordered for chest x-ray and documentation they are suspicious for viral infection.   Clinical Impression   Patient received for OT evaluation. See flowsheet below for details of function. Generally, patient requiring supervision/standby for bed mobility, supervision/standby for functional mobility, and supervision/standby for ADLs. Patient will benefit from continued OT while in acute care. Pt would benefit from increased IADL assistance from her family; has two nieces, although one is going on vacation tomorrow for a week; unclear if the other niece could increase support. If pt does not have 24/7 support at d/c, would benefit from short term rehab to improve activity tolerance prior to returning home.      Recommendations for follow up therapy are one component of a multi-disciplinary discharge planning process, led by the attending physician.  Recommendations may be updated based on patient status, additional functional criteria and insurance authorization.   Follow Up Recommendations  Home health OT    Assistance Recommended at Discharge Intermittent Supervision/Assistance  Patient can return home with the following A little help with bathing/dressing/bathroom;Assistance with cooking/housework;Direct supervision/assist for financial management;Assist for transportation     Functional Status Assessment  Patient has had a recent decline in their functional status and demonstrates the ability to make significant improvements in function in a reasonable and predictable amount of time.  Equipment Recommendations  None recommended by OT (has needed DME)    Recommendations for Other Services       Precautions / Restrictions Precautions Precautions: Fall Precaution Comments: Pt has hx of one recent fall 2 weeks ago; denies other falls in the past few months. Restrictions Weight Bearing Restrictions: No      Mobility Bed Mobility Overal bed mobility: Modified Independent Bed Mobility: Supine to Sit, Sit to Supine     Supine to sit: Supervision Sit to supine: Supervision   General bed mobility comments: Pt using rails. No hands-on assist of OT needed for bed mobility today.    Transfers Overall transfer level: Modified independent Equipment used: Rolling walker (2 wheels) Transfers: Sit to/from Stand Sit to Stand: Supervision           General transfer comment: Bedroom slippers not fitting well, but pt eventually able to get them on; pt states she has better fitting ones at home. OT only helping with holding up brief to keep it from falling to the floor.      Balance Overall balance assessment:  (Doing well with SBA with RW)                                         ADL either performed or assessed with clinical judgement   ADL Overall ADL's : Modified independent  General ADL Comments: Pt able to reach forward at edge of stretcher and touch toes; endorses recent difficulty with donning compression hoes, which is why she has just been wearing them 24/7; typically uses a piece of cloth to assist with donning. Pt moves very slowly with RW, but no loss of balance today walking to the door and back. BIL hand grip strength WNL. BIL UE ROM WNL.     Vision Ability to See in  Adequate Light: 0 Adequate       Perception     Praxis      Pertinent Vitals/Pain Pain Assessment Pain Assessment: No/denies pain (states "I just feel weak.")     Hand Dominance Right   Extremity/Trunk Assessment Upper Extremity Assessment Upper Extremity Assessment: Overall WFL for tasks assessed   Lower Extremity Assessment Lower Extremity Assessment: Defer to PT evaluation       Communication Communication Communication: No difficulties   Cognition Arousal/Alertness: Awake/alert Behavior During Therapy: Flat affect, WFL for tasks assessed/performed Overall Cognitive Status: Within Functional Limits for tasks assessed                                 General Comments: Follows all commands appropriately. States "I'm a quiet person".     General Comments       Exercises     Shoulder Instructions      Home Living Family/patient expects to be discharged to:: Private residence Living Arrangements: Alone (Pt states that she was with her husband for 70 years. He died 65 years ago. Has no children.) Available Help at Discharge: Family;Available 24 hours/day (Pt states that she has two nieces; one lives slightly closer to her than the other; states that the niece who would most likely be the one to care for her is traveling to [place] tomorrow for a week to celebrate a family wedding. Unclear if pt has 24/7 now) Type of Home: House Home Access: Stairs to enter [employer] of Steps: 1 Entrance Stairs-Rails: None Home Layout: One level     Bathroom Shower/Tub: Tub/shower unit;Sponge bathes at baseline   Bathroom Toilet: Handicapped height     Home Equipment: Ebony - single point;BSC/3in1;Rollator (4 wheels)   Additional Comments: Pt states she typically does not use AD, but has had to use cane and then rollator recently 2/2 feeling fatigued/weak the past 2-4 weeks.      Prior Functioning/Environment Prior Level of Function :  Independent/Modified Independent             Mobility Comments: Mod indep with household and community mobilization; rollator for community distances.  Denies fall history; no home O2. Pt just in the past two weeks has a housekeeper. ADLs Comments: (I). Sponge bathes at baseline. Just in the past two weeks has obtained a housekeeper. Otherwise, drives, grocery shops, etc, prior to about 2-4 weeks ago when pt started feeling unwell.        OT Problem List: Decreased activity tolerance      OT Treatment/Interventions: Self-care/ADL training;Therapeutic exercise;Energy conservation;Therapeutic activities;Patient/family education;DME and/or AE instruction    OT Goals(Current goals can be found in the care plan section) Acute Rehab OT Goals Patient Stated Goal: Feel not so weak OT Goal Formulation: With patient Time For Goal Achievement: 08/01/22 Potential to Achieve Goals: Good ADL Goals Pt Will Perform Grooming: with modified independence;standing Pt Will Perform Lower Body Dressing: with modified independence Pt Will Transfer to Toilet: with modified  independence;bedside commode (with bedside commode over toilet) Pt Will Perform Toileting - Clothing Manipulation and hygiene: with modified independence  OT Frequency: Min 2X/week    Co-evaluation              AM-PAC OT "6 Clicks" Daily Activity     Outcome Measure Help from another person eating meals?: None Help from another person taking care of personal grooming?: A Little Help from another person toileting, which includes using toliet, bedpan, or urinal?: A Little Help from another person bathing (including washing, rinsing, drying)?: A Little Help from another person to put on and taking off regular upper body clothing?: A Little Help from another person to put on and taking off regular lower body clothing?: A Little 6 Click Score: 19   End of Session Equipment Utilized During Treatment: Rolling walker (2  wheels)  Activity Tolerance: Patient tolerated treatment well Patient left: in bed;with call bell/phone within reach  OT Visit Diagnosis: Muscle weakness (generalized) (M62.81)                Time: 1337-1410 OT Time Calculation (min): 33 min Charges:  OT General Charges $OT Visit: 1 Visit OT Evaluation $OT Eval Moderate Complexity: 1 Mod  [place], MS, OTR/L   Vania Rea 07/18/2022, 3:34 PM

## 2022-07-18 NOTE — ED Notes (Signed)
Pt niece Tye [place] called and updated on pt status and location.

## 2022-07-19 DIAGNOSIS — I5032 Chronic diastolic (congestive) heart failure: Secondary | ICD-10-CM | POA: Diagnosis not present

## 2022-07-19 DIAGNOSIS — I482 Chronic atrial fibrillation, unspecified: Secondary | ICD-10-CM | POA: Diagnosis not present

## 2022-07-19 DIAGNOSIS — J189 Pneumonia, unspecified organism: Secondary | ICD-10-CM | POA: Diagnosis not present

## 2022-07-19 LAB — MAGNESIUM: Magnesium: 1.9 mg/dL (ref 1.7–2.4)

## 2022-07-19 LAB — BASIC METABOLIC PANEL
Anion gap: 5 (ref 5–15)
BUN: 12 mg/dL (ref 8–23)
CO2: 26 mmol/L (ref 22–32)
Calcium: 8.4 mg/dL — ABNORMAL LOW (ref 8.9–10.3)
Chloride: 97 mmol/L — ABNORMAL LOW (ref 98–111)
Creatinine, Ser: 0.99 mg/dL (ref 0.44–1.00)
GFR, Estimated: 53 mL/min — ABNORMAL LOW (ref >60)
Glucose, Bld: 93 mg/dL (ref 70–99)
Potassium: 3.7 mmol/L (ref 3.5–5.1)
Sodium: 128 mmol/L — ABNORMAL LOW (ref 135–145)

## 2022-07-19 MED ORDER — AMLODIPINE BESYLATE 10 MG PO TABS
10.0000 mg | ORAL_TABLET | Freq: Every day | ORAL | Status: DC
  Administered 2022-07-19 – 2022-07-24 (×6): 10 mg via ORAL
  Filled 2022-07-19 (×6): qty 1
  Filled 2022-07-19: qty 2

## 2022-07-19 MED ORDER — SENNOSIDES-DOCUSATE SODIUM 8.6-50 MG PO TABS
2.0000 | ORAL_TABLET | Freq: Two times a day (BID) | ORAL | Status: DC
  Administered 2022-07-20 – 2022-07-24 (×8): 2 via ORAL
  Filled 2022-07-19 (×9): qty 2

## 2022-07-19 NOTE — Progress Notes (Signed)
[facility]  PROGRESS NOTE   Subjective:   Awake and alert and comfortable.  Objective:  Vital signs: Blood pressure (!) 145/50, pulse 66, temperature 98.3 F (36.8 C), temperature source Oral, resp. rate 17, height '5\' 5"'$  (1.651 m), weight 67.6 kg, SpO2 96 %.  Intake/Output Summary (Last 24 hours) at 07/19/2022 1129 Last data filed at 07/18/2022 2235 Gross per 24 hour  Intake --  Output 800 ml  Net -800 ml   Filed Weights   07/17/22 1248  Weight: 67.6 kg     Physical Exam: General:  No acute distress  Head:  Normocephalic, atraumatic. Moist oral mucosal membranes  Eyes:  Anicteric  Neck:  Supple  Lungs:   Clear to auscultation, normal effort  Heart:  S1S2 no rubs  Abdomen:   Soft, nontender, bowel sounds present  Extremities:  peripheral edema.  Neurologic:  Awake, alert, following commands  Skin:  No lesions  Access:     Basic Metabolic Panel: Recent Labs  Lab 07/17/22 1257 07/17/22 1750 07/17/22 1816 07/18/22 0455 07/19/22 0554  NA 118*  --  121* 125* 128*  K 3.4*  --  3.2* 3.8 3.7  CL 79*  --  83* 91* 97*  CO2 29  --  '28 28 26  '$ GLUCOSE 106*  --  101* 88 93  BUN 16  --  '13 10 12  '$ CREATININE 0.88  --  0.74 0.72 0.99  CALCIUM 9.1  --  8.3* 8.2* 8.4*  MG 1.9  --   --   --  1.9  PHOS  --  3.1  --   --   --     CBC: Recent Labs  Lab 07/17/22 1257 07/18/22 0455  WBC 9.5 8.7  NEUTROABS 7.8*  --   HGB 13.5 11.6*  HCT 39.1 34.8*  MCV 88.5 91.1  PLT 201 200     Urinalysis: No results for input(s): "COLORURINE", "LABSPEC", "PHURINE", "GLUCOSEU", "HGBUR", "BILIRUBINUR", "KETONESUR", "PROTEINUR", "UROBILINOGEN", "NITRITE", "LEUKOCYTESUR" in the last 72 hours.  Invalid input(s): "APPERANCEUR"    Imaging: CT HEAD WO CONTRAST (5MM)  Result Date: 07/17/2022 CLINICAL DATA:  Nausea and poor appetite. EXAM: CT HEAD WITHOUT CONTRAST TECHNIQUE: Contiguous axial images were obtained from the base of the skull through the vertex without  intravenous contrast. RADIATION DOSE REDUCTION: This exam was performed according to the departmental dose-optimization program which includes automated exposure control, adjustment of the mA and/or kV according to patient size and/or use of iterative reconstruction technique. COMPARISON:  CT head without and with contrast 03/10/2013. FINDINGS: Brain: There is mild-to-moderate cerebral atrophy and atrophic ventriculomegaly, moderately developed small-vessel disease in the cerebral white matter, findings slightly progressed from 2014. There is minimal cerebellar atrophy which is unchanged. No new asymmetry is seen concerning for an acute cortical based infarct, hemorrhage, mass or midline shift. Basal cisterns are clear. Vascular: There are patchy calcifications of the carotid siphons but no hyperdense central vessels. Skull: Negative for fractures or focal lesions.  Mild osteopenia. Sinuses/Orbits: There are old lens extractions, otherwise unremarkable orbits and orbital contents. There is patchy membrane thickening in the ethmoid air cells, mild membrane disease with mild fluid in the right sphenoid air cell. Other visualized sinuses, bilateral mastoid air cells and middle ears are clear. Other: Dystrophic benign dural calcifications are scattered along the falx, as before. IMPRESSION: Chronic changes.  No acute intracranial CT findings. Ethmoid and right sphenoid sinus disease, including with fluid in the right sphenoid sinus. Electronically Signed   By:  Telford Nab M.D.   On: 07/17/2022 20:06   US Abdomen Limited RUQ (LIVER/GB)  Result Date: 07/17/2022 CLINICAL DATA:  One day of right upper quadrant abdominal pain. EXAM: ULTRASOUND ABDOMEN LIMITED RIGHT UPPER QUADRANT COMPARISON:  CT abdomen and pelvis March 13, 2018 FINDINGS: Gallbladder: Cholelithiasis in a nondistended gallbladder without gallbladder wall thickening. No sonographic Murphy sign noted by sonographer. Common bile duct: Diameter: 3 mm  Liver: No focal lesion identified. Coarsened hepatic echotexture with nodular hepatic contour. Portal vein is patent on color Doppler imaging with normal direction of blood flow towards the liver. Other: None. IMPRESSION: 1. Cholelithiasis without sonographic evidence of acute cholecystitis. 2. Hepatic morphologic changes suggestive of cirrhosis. Electronically Signed   By: Dahlia Bailiff M.D.   On: 07/17/2022 17:19   DG Chest 2 View  Result Date: 07/17/2022 CLINICAL DATA:  Cough EXAM: CHEST - 2 VIEW COMPARISON:  08/28/2021 FINDINGS: Stable cardiomediastinal contours. Linear areas of bibasilar scarring or atelectasis. Small subtle opacity in the right upper lobe. No pleural effusion or pneumothorax. IMPRESSION: Small subtle opacity in the right upper lobe, which may reflect developing pneumonia. Electronically Signed   By: Davina Poke D.O.   On: 07/17/2022 13:43     Medications:    cefTRIAXone (ROCEPHIN)  IV Stopped (07/18/22 1828)    amiodarone  100 mg Oral Daily   amLODipine  10 mg Oral Daily   apixaban  2.5 mg Oral BID   calcium-vitamin D  1 tablet Oral BID   doxycycline  100 mg Oral Q12H   metoprolol tartrate  50 mg Oral Daily   mometasone-formoterol  2 puff Inhalation BID   senna-docusate  2 tablet Oral BID   sodium chloride  1 g Oral BID WC    Assessment/ Plan:     Principal Problem:   CAP (community acquired pneumonia) Active Problems:   Hypertension   Hyponatremia   Depression   Hypokalemia   CAD (coronary artery disease)   HLD (hyperlipidemia)   Atrial fibrillation, chronic (HCC)   Prolonged QT interval   Nausea vomiting and diarrhea   Abnormal LFTs   Chronic diastolic CHF (congestive heart failure) ([place])   Fall at home, initial encounter   Viral gastroenteritis   Paroxysmal atrial fibrillation ([place])  88 year old female with history of hypertension, coronary artery disease, atrial fibrillation, congestive heart failure, GERD, osteoarthritis now admitted with  history of cough, nausea, vomiting and found to have hyponatremia and pneumonia.  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  There may be a component of prerenal azotemia.  We will continue the salt tablets.  #2: Pneumonia: She was on on Rocephin which is now changed to doxycycline.  #3: Hypertension/CHF: Continue present antihypertensive medications.  Continue fluid restriction to 1 L daily.  We will follow closely.   LOS: 2 Lyla Son, MD [facility] 10/21/202311:29 AM

## 2022-07-19 NOTE — Plan of Care (Signed)

## 2022-07-19 NOTE — Progress Notes (Addendum)
  Progress Note   Patient: [name] ZCH:[id number] DOB: July 27, 1927 DOA: 07/17/2022     2 DOS: the patient was seen and examined on 07/19/2022   Brief hospital course: [name] is a 88 y.o. female with medical history significant of hypertension, hyperlipidemia, GERD, depression, CHF, atrial fibrillation on Eliquis, CAD, non-STEMI, who presents with cough, nausea, vomiting. Chest x-ray showed mild infiltration in the right upper lobe concerning for pneumonia. Patient is started on antibiotics with Rocephin and doxycycline. Patient currently lives alone at home, she had a fall 2 weeks ago, she has generalized weakness.  Assessment and Plan: Right upper lobe community acquired pneumonia  Patient currently does not have hypoxemia, no sepsis. Blood cultures so far has no growth. Continue Rocephin and doxycycline.   Acute gastroenteritis. Hyponatremia secondary to dehydration. Hypokalemia secondary to nausea vomiting diarrhea. GI symptom has resolved, but patient still has poor appetite.  Not have a bowel movement since admission.  Sodium level gradually improving, will continue sodium chloride tablets.   Generalized weakness and fall. Spoke with patient niece, patient currently does not have support at home.  She lives alone, she has nursing visit 3 times a week.  She may need a nursing placement.   Chronic diastolic congestive heart failure No evidence of exacerbation.   Paroxysmal atrial fibrillation. Currently in sinus rhythm, continue anticoagulation.   Essential hypertension. On metoprolol, also added and amlodipine.   Prolonged QT interval. Continue to monitor.       Subjective:  Patient still feels very weak poor appetite.  But no nausea vomiting.  No bowel movement since admission.  Physical Exam: Vitals:   07/19/22 0719 07/19/22 0730 07/19/22 0753 07/19/22 0900  BP:  (!) 158/53  (!) 145/50  Pulse: (!) 55 (!) 55  66  Resp: '19 20  17  '$ Temp:   98.3 F (36.8 C)    TempSrc:   Oral   SpO2: 92% 91%  96%  Weight:      Height:       General exam: Appears calm and comfortable, frail. Respiratory system: Clear to auscultation. Respiratory effort normal. Cardiovascular system: Irregular. No JVD, murmurs, rubs, gallops or clicks. No pedal edema. Gastrointestinal system: Abdomen is nondistended, soft and nontender. No organomegaly or masses felt. Normal bowel sounds heard. Central nervous system: Alert and oriented. No focal neurological deficits. Extremities: Symmetric 5 x 5 power. Skin: No rashes, lesions or ulcers Psychiatry:  Mood & affect appropriate.   Data Reviewed:  Lab results reviewed.  Family Communication: Niece updated.  Disposition: Status is: Inpatient Remains inpatient appropriate because: Severity of disease, IV treatment.  Planned Discharge Destination: Skilled nursing facility    Time spent: 35 minutes  Author: Sharen Hones, MD 07/19/2022 10:29 AM  For on call review [email or web address].

## 2022-07-20 ENCOUNTER — Inpatient Hospital Stay: Payer: Medicare HMO

## 2022-07-20 DIAGNOSIS — I482 Chronic atrial fibrillation, unspecified: Secondary | ICD-10-CM | POA: Diagnosis not present

## 2022-07-20 DIAGNOSIS — I5032 Chronic diastolic (congestive) heart failure: Secondary | ICD-10-CM | POA: Diagnosis not present

## 2022-07-20 DIAGNOSIS — J189 Pneumonia, unspecified organism: Secondary | ICD-10-CM | POA: Diagnosis not present

## 2022-07-20 LAB — SODIUM: Sodium: 133 mmol/L — ABNORMAL LOW (ref 135–145)

## 2022-07-20 MED ORDER — ONDANSETRON HCL 4 MG/2ML IJ SOLN: 4.0000 mg | Freq: Four times a day (QID) | INTRAMUSCULAR | Status: DC | PRN

## 2022-07-20 MED ORDER — SCOPOLAMINE 1 MG/3DAYS TD PT72
1.0000 | MEDICATED_PATCH | TRANSDERMAL | Status: DC
  Administered 2022-07-20: 1.5 mg via TRANSDERMAL
  Filled 2022-07-20 (×2): qty 1

## 2022-07-20 MED ORDER — LACTULOSE 10 GM/15ML PO SOLN
20.0000 g | Freq: Once | ORAL | Status: AC
  Administered 2022-07-20: 20 g via ORAL
  Filled 2022-07-20: qty 30

## 2022-07-20 NOTE — Progress Notes (Addendum)
  Progress Note   Patient: [name] [id number] DOB: 1927-07-15 DOA: 07/17/2022     3 DOS: the patient was seen and examined on 07/20/2022   Brief hospital course: [name] is a 88 y.o. female with medical history significant of hypertension, hyperlipidemia, GERD, depression, CHF, atrial fibrillation on Eliquis, CAD, non-STEMI, who presents with cough, nausea, vomiting. Chest x-ray showed mild infiltration in the right upper lobe concerning for pneumonia. Patient is started on antibiotics with Rocephin and doxycycline. Patient currently lives alone at home, she had a fall 2 weeks ago, she has generalized weakness.  Assessment and Plan: Right upper lobe community acquired pneumonia  Patient currently does not have hypoxemia, no sepsis. Blood cultures have no growth. Will complete 5 days of antibiotics.   Acute gastroenteritis. Hyponatremia secondary to dehydration. Hypokalemia secondary to nausea vomiting diarrhea. Sodium level continue to improve, continue salt tablets.  Liberate fluid restriction to 1500 mL.    Generalized weakness and fall. Continue PT/OT, pending nursing placement.   Chronic diastolic congestive heart failure No evidence of exacerbation.   Paroxysmal atrial fibrillation. Currently in sinus rhythm, continue anticoagulation.   Essential hypertension. On metoprolol, also added and amlodipine.   Prolonged QT interval. Continue to monitor.         Subjective:  Patient still has signal weakness, otherwise no complaints.  Physical Exam: Vitals:   07/20/22 0011 07/20/22 0441 07/20/22 0500 07/20/22 0831  BP: (!) 151/53 (!) 160/58  (!) 170/58  Pulse: 60 62  63  Resp: '16 20  18  '$ Temp: 98.5 F (36.9 C) 98.8 F (37.1 C)  98.8 F (37.1 C)  TempSrc:  Oral    SpO2: 96% 93%  95%  Weight:   69 kg   Height:       General exam: Appears calm and comfortable  Respiratory system: Clear to auscultation. Respiratory effort normal. Cardiovascular system:  Irregular. No JVD, murmurs, rubs, gallops or clicks. No pedal edema. Gastrointestinal system: Abdomen is nondistended, soft and nontender. No organomegaly or masses felt. Normal bowel sounds heard. Central nervous system: Alert and oriented. No focal neurological deficits. Extremities: Symmetric 5 x 5 power. Skin: No rashes, lesions or ulcers Psychiatry: Judgement and insight appear normal. Mood & affect appropriate.   Data Reviewed:  Lab results reviewed.  Family Communication:   Disposition: Status is: Inpatient Remains inpatient appropriate because: Severity of disease, IV treatment.  Planned Discharge Destination: Skilled nursing facility    Time spent: 35 minutes  Author: Sharen Hones, MD 07/20/2022 12:00 PM  For on call review [email or web address].

## 2022-07-20 NOTE — NC FL2 (Signed)
[place] LEVEL OF CARE SCREENING TOOL     IDENTIFICATION  Patient Name: [name] Birthdate: October 11, 1926 Sex: female Admission Date (Current Location): 07/17/2022  [facility] and [place] Number:  Engineering geologist and Address:  [facility], [address] [place], [place] [address]      Provider Number: [id number]  Attending Physician Name and Address:  Sharen Hones, MD  Relative Name and Phone Number:       Current Level of Care: Hospital Recommended Level of Care: [place] Prior Approval Number:    Date Approved/Denied:   PASRR Number: [id number]  Discharge Plan: SNF    Current Diagnoses: Patient Active Problem List   Diagnosis Date Noted   Viral gastroenteritis 07/18/2022   Paroxysmal atrial fibrillation ([place]) 07/18/2022   CAP (community acquired pneumonia) 07/17/2022   CAD (coronary artery disease) 07/17/2022   HLD (hyperlipidemia) 07/17/2022   Atrial fibrillation, chronic ([place]) 07/17/2022   Prolonged QT interval 07/17/2022   Nausea vomiting and diarrhea 07/17/2022   Abnormal LFTs 07/17/2022   Chronic diastolic CHF (congestive heart failure) ([place]) 07/17/2022   Fall at home, initial encounter 07/17/2022   LFT elevation    Malnutrition of moderate degree 78/58/8502   Acute systolic CHF (congestive heart failure) ([place]) 08/30/2021   Diarrhea 08/29/2021   Depression 08/29/2021   Hypokalemia 08/29/2021   NSTEMI (non-ST elevated myocardial infarction) ([place]) 08/28/2021   AKI (acute kidney injury) ([place])    Hypoxia    RLL pneumonia 08/27/2020   Acute respiratory failure with hypoxia ([place]) 08/27/2020   Hyponatremia 08/27/2020   Community acquired pneumonia 08/27/2020   Atrial fibrillation with rapid ventricular response ([place]) 08/27/2020   DNR (do not resuscitate)/DNI(Do Not Intubate) 08/27/2020   SOB (shortness of breath) 06/30/2018   GERD (gastroesophageal reflux disease) 04/29/2018   Hypertension  03/02/2018   Arthritis 03/02/2018   Pain in limb 03/02/2018   HTN (hypertension), benign 01/08/2014    Orientation RESPIRATION BLADDER Height & Weight     Self, Time, Situation, Place  Normal Incontinent Weight: 152 lb 1.9 oz (69 kg) Height:  '5\' 5"'$  (165.1 cm)  BEHAVIORAL SYMPTOMS/MOOD NEUROLOGICAL BOWEL NUTRITION STATUS      Continent Diet (fluid restriction: 1500 mL)  AMBULATORY STATUS COMMUNICATION OF NEEDS Skin   Limited Assist Verbally Normal                       Personal Care Assistance Level of Assistance  Feeding, Dressing, Bathing Bathing Assistance: Limited assistance Feeding assistance: Independent Dressing Assistance: Limited assistance     Functional Limitations Info  Sight, Hearing, Speech Sight Info: Adequate Hearing Info: Adequate Speech Info: Adequate    SPECIAL CARE FACTORS FREQUENCY  PT (By licensed PT), OT (By licensed OT)     PT Frequency: 5x OT Frequency: 5x            Contractures Contractures Info: Not present    Additional Factors Info  Code Status, Allergies Code Status Info: DNR Allergies Info: Amoxicillin-pot Clavulanate, Cefprozil, Clarithromycin, Etodolac, Prednisone, Pseudoephedrine, Penicillins, Sulfa Antibiotics           Current Medications (07/20/2022):  This is the current hospital active medication list Current Facility-Administered Medications  Medication Dose Route Frequency Provider Last Rate Last Admin   acetaminophen (TYLENOL) tablet 325 mg  325 mg Oral Q6H PRN Ivor Costa, MD   325 mg at 07/20/22 0911   albuterol (PROVENTIL) (2.5 MG/3ML) 0.083% nebulizer solution 2.5 mg  2.5 mg Nebulization  Q4H PRN Ivor Costa, MD       amiodarone (PACERONE) tablet 100 mg  100 mg Oral Daily Ivor Costa, MD   100 mg at 07/20/22 0911   amLODipine (NORVASC) tablet 10 mg  10 mg Oral Daily Sharen Hones, MD   10 mg at 07/20/22 0912   apixaban (ELIQUIS) tablet 2.5 mg  2.5 mg Oral BID Ivor Costa, MD   2.5 mg at 07/20/22 0912    calcium-vitamin D (OSCAL WITH D) 500-5 MG-MCG per tablet 1 tablet  1 tablet Oral BID Ivor Costa, MD   1 tablet at 07/20/22 0911   cefTRIAXone (ROCEPHIN) 1 g in sodium chloride 0.9 % 100 mL IVPB  1 g Intravenous Q24H Rito Ehrlich A, RPH 200 mL/hr at 07/19/22 1943 1 g at 07/19/22 1943   dextromethorphan-guaiFENesin ([place] DM) 30-600 MG per 12 hr tablet 1 tablet  1 tablet Oral BID PRN Ivor Costa, MD       diphenhydrAMINE (BENADRYL) injection 12.5 mg  12.5 mg Intravenous Q8H PRN Ivor Costa, MD       doxycycline (VIBRA-TABS) tablet 100 mg  100 mg Oral Q12H Ivor Costa, MD   100 mg at 07/20/22 7017   hydrALAZINE (APRESOLINE) injection 10 mg  10 mg Intravenous Q2H PRN Ivor Costa, MD   10 mg at 07/17/22 1929   loratadine (CLARITIN) tablet 10 mg  10 mg Oral Daily PRN Ivor Costa, MD       metoprolol tartrate (LOPRESSOR) tablet 50 mg  50 mg Oral Daily Ivor Costa, MD   50 mg at 07/20/22 0911   mometasone-formoterol (DULERA) 100-5 MCG/ACT inhaler 2 puff  2 puff Inhalation BID Ivor Costa, MD   2 puff at 07/20/22 0912   polyvinyl alcohol (LIQUIFILM TEARS) 1.4 % ophthalmic solution 1 drop  1 drop Both Eyes QID PRN Ivor Costa, MD       senna-docusate (Senokot-S) tablet 2 tablet  2 tablet Oral BID Sharen Hones, MD   2 tablet at 07/20/22 7939   sodium chloride tablet 1 g  1 g Oral BID WC Ivor Costa, MD   1 g at 07/20/22 0300     Discharge Medications: Please see discharge summary for a list of discharge medications.  Relevant Imaging Results:  Relevant Lab Results:   Additional Information SSN:[id number]  [name] Crystal Mcginniss, LCSW

## 2022-07-20 NOTE — TOC Initial Note (Signed)
Transition of Care [facility]) - Initial/Assessment Note    Patient Details  Name: [name] MRN: [id number] Date of Birth: 1927-02-07  [facility] CM/SW Contact:    Eileen Stanford, LCSW Phone Number: 07/20/2022, 4:04 PM  Clinical Narrative:   CSW spoke with pt and pt states she wants to go to SNF. Pt states she has been to [facility] in the past and wants to go back there. CSW will notify PT that pt doesn't feel safe dc home.                 Expected Discharge Plan: Skilled Nursing Facility Barriers to Discharge: Continued Medical Work up   Patient Goals and CMS Choice Patient states their goals for this hospitalization and ongoing recovery are:: to go to snf      Expected Discharge Plan and Services Expected Discharge Plan: [place] In-house Referral: Clinical Social Work   Post Acute Care Choice: [place] Living arrangements for the past 2 months: [place]                                      Prior Living Arrangements/Services Living arrangements for the past 2 months: Single Family Home Lives with:: Self Patient language and need for interpreter reviewed:: Yes Do you feel safe going back to the place where you live?: Yes      Need for Family Participation in Patient Care: Yes (Comment) Care giver support system in place?: Yes (comment)   Criminal Activity/Legal Involvement Pertinent to Current Situation/Hospitalization: No - Comment as needed  Activities of Daily Living   ADL Screening (condition at time of admission) Patient's cognitive ability adequate to safely complete daily activities?: Yes Is the patient deaf or have difficulty hearing?: No Does the patient have difficulty seeing, even when wearing glasses/contacts?: No Does the patient have difficulty concentrating, remembering, or making decisions?: No Patient able to express need for assistance with ADLs?: Yes Does the patient have  difficulty dressing or bathing?: No Independently performs ADLs?: Yes (appropriate for developmental age) Does the patient have difficulty walking or climbing stairs?: No Weakness of Legs: None Weakness of Arms/Hands: None  Permission Sought/Granted Permission sought to share information with : Family Supports Permission granted to share information with : Yes, Verbal Permission Granted  Share Information with NAME: cathy     Permission granted to share info w Relationship: neice     Emotional Assessment Appearance:: Appears stated age Attitude/Demeanor/Rapport: Engaged Affect (typically observed): Accepting Orientation: : Oriented to Situation, Oriented to  Time, Oriented to Place, Oriented to Self Alcohol / Substance Use: Not Applicable Psych Involvement: No (comment)  Admission diagnosis:  Hyponatremia [E87.1] CAP (community acquired pneumonia) [J18.9] Uncontrolled hypertension [I10] LFT elevation [R79.89] Pneumonia of right upper lobe due to infectious organism [J18.9] Nausea and vomiting, unspecified vomiting type [R11.2] Patient Active Problem List   Diagnosis Date Noted   Viral gastroenteritis 07/18/2022   Paroxysmal atrial fibrillation ([place]) 07/18/2022   CAP (community acquired pneumonia) 07/17/2022   CAD (coronary artery disease) 07/17/2022   HLD (hyperlipidemia) 07/17/2022   Atrial fibrillation, chronic ([place]) 07/17/2022   Prolonged QT interval 07/17/2022   Nausea vomiting and diarrhea 07/17/2022   Abnormal LFTs 07/17/2022   Chronic diastolic CHF (congestive heart failure) ([place]) 07/17/2022   Fall at home, initial encounter 07/17/2022   LFT elevation    Malnutrition of moderate degree 08/31/2021  Acute systolic CHF (congestive heart failure) ([place]) 08/30/2021   Diarrhea 08/29/2021   Depression 08/29/2021   Hypokalemia 08/29/2021   NSTEMI (non-ST elevated myocardial infarction) ([place]) 08/28/2021   AKI (acute kidney injury) ([place])    Hypoxia    RLL pneumonia  08/27/2020   Acute respiratory failure with hypoxia ([place]) 08/27/2020   Hyponatremia 08/27/2020   Community acquired pneumonia 08/27/2020   Atrial fibrillation with rapid ventricular response ([place]) 08/27/2020   DNR (do not resuscitate)/DNI(Do Not Intubate) 08/27/2020   SOB (shortness of breath) 06/30/2018   GERD (gastroesophageal reflux disease) 04/29/2018   Hypertension 03/02/2018   Arthritis 03/02/2018   Pain in limb 03/02/2018   HTN (hypertension), benign 01/08/2014   PCP:  Rusty Aus, MD Pharmacy:   [place], [place] [address] [place] [address] Phone: [phone] Fax: [phone]     Social Determinants of Health (SDOH) Interventions    Readmission Risk Interventions     No data to display

## 2022-07-20 NOTE — Progress Notes (Signed)
[facility]  PROGRESS NOTE   Subjective:   Awake and alert.  Objective:  Vital signs: Blood pressure (!) 170/58, pulse 63, temperature 98.8 F (37.1 C), resp. rate 18, height '5\' 5"'$  (1.651 m), weight 69 kg, SpO2 95 %.  Intake/Output Summary (Last 24 hours) at 07/20/2022 1053 Last data filed at 07/20/2022 0445 Gross per 24 hour  Intake 192.79 ml  Output 550 ml  Net -357.21 ml   Filed Weights   07/17/22 1248 07/20/22 0500  Weight: 67.6 kg 69 kg     Physical Exam: General:  No acute distress  Head:  Normocephalic, atraumatic. Moist oral mucosal membranes  Eyes:  Anicteric  Neck:  Supple  Lungs:   Clear to auscultation, normal effort  Heart:  S1S2 no rubs  Abdomen:   Soft, nontender, bowel sounds present  Extremities:  peripheral edema.  Neurologic:  Awake, alert, following commands  Skin:  No lesions  Access:     Basic Metabolic Panel: Recent Labs  Lab 07/17/22 1257 07/17/22 1750 07/17/22 1816 07/18/22 0455 07/19/22 0554 07/20/22 0538  NA 118*  --  121* 125* 128* 133*  K 3.4*  --  3.2* 3.8 3.7  --   CL 79*  --  83* 91* 97*  --   CO2 29  --  '28 28 26  '$ --   GLUCOSE 106*  --  101* 88 93  --   BUN 16  --  '13 10 12  '$ --   CREATININE 0.88  --  0.74 0.72 0.99  --   CALCIUM 9.1  --  8.3* 8.2* 8.4*  --   MG 1.9  --   --   --  1.9  --   PHOS  --  3.1  --   --   --   --     CBC: Recent Labs  Lab 07/17/22 1257 07/18/22 0455  WBC 9.5 8.7  NEUTROABS 7.8*  --   HGB 13.5 11.6*  HCT 39.1 34.8*  MCV 88.5 91.1  PLT 201 200     Urinalysis: No results for input(s): "COLORURINE", "LABSPEC", "PHURINE", "GLUCOSEU", "HGBUR", "BILIRUBINUR", "KETONESUR", "PROTEINUR", "UROBILINOGEN", "NITRITE", "LEUKOCYTESUR" in the last 72 hours.  Invalid input(s): "APPERANCEUR"    Imaging: No results found.   Medications:    cefTRIAXone (ROCEPHIN)  IV 1 g (07/19/22 1943)    amiodarone  100 mg Oral Daily   amLODipine  10 mg Oral Daily   apixaban  2.5 mg Oral BID    calcium-vitamin D  1 tablet Oral BID   doxycycline  100 mg Oral Q12H   metoprolol tartrate  50 mg Oral Daily   mometasone-formoterol  2 puff Inhalation BID   senna-docusate  2 tablet Oral BID   sodium chloride  1 g Oral BID WC    Assessment/ Plan:     Principal Problem:   CAP (community acquired pneumonia) Active Problems:   Hypertension   Hyponatremia   Depression   Hypokalemia   CAD (coronary artery disease)   HLD (hyperlipidemia)   Atrial fibrillation, chronic (HCC)   Prolonged QT interval   Nausea vomiting and diarrhea   Abnormal LFTs   Chronic diastolic CHF (congestive heart failure) ([place])   Fall at home, initial encounter   Viral gastroenteritis   Paroxysmal atrial fibrillation ([place])  88 year old female with history of hypertension, coronary artery disease, atrial fibrillation, congestive heart failure, GERD, osteoarthritis now admitted with history of cough, nausea, vomiting and found to have hyponatremia and pneumonia.   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  There may be a component of prerenal azotemia.  We will continue the salt tablets.   #2: Pneumonia: She was on on Rocephin which is now changed to doxycycline.   #3: Hypertension/CHF: Continue present antihypertensive medications.  Continue fluid restriction to 1 L daily.   We will follow closely.   LOS: [place], MD [facility] 10/22/202310:53 AM

## 2022-07-21 DIAGNOSIS — I5032 Chronic diastolic (congestive) heart failure: Secondary | ICD-10-CM | POA: Diagnosis not present

## 2022-07-21 DIAGNOSIS — E876 Hypokalemia: Secondary | ICD-10-CM | POA: Diagnosis not present

## 2022-07-21 DIAGNOSIS — J189 Pneumonia, unspecified organism: Secondary | ICD-10-CM | POA: Diagnosis not present

## 2022-07-21 LAB — BASIC METABOLIC PANEL
Anion gap: 8 (ref 5–15)
BUN: 14 mg/dL (ref 8–23)
CO2: 26 mmol/L (ref 22–32)
Calcium: 8.7 mg/dL — ABNORMAL LOW (ref 8.9–10.3)
Chloride: 100 mmol/L (ref 98–111)
Creatinine, Ser: 0.83 mg/dL (ref 0.44–1.00)
GFR, Estimated: >60 mL/min (ref >60)
Glucose, Bld: 97 mg/dL (ref 70–99)
Potassium: 2.8 mmol/L — ABNORMAL LOW (ref 3.5–5.1)
Sodium: 134 mmol/L — ABNORMAL LOW (ref 135–145)

## 2022-07-21 LAB — PHOSPHORUS: Phosphorus: 3.8 mg/dL (ref 2.5–4.6)

## 2022-07-21 LAB — MAGNESIUM: Magnesium: 1.9 mg/dL (ref 1.7–2.4)

## 2022-07-21 MED ORDER — POTASSIUM CHLORIDE 20 MEQ PO PACK
40.0000 meq | PACK | Freq: Two times a day (BID) | ORAL | Status: AC
  Administered 2022-07-21 (×2): 40 meq via ORAL
  Filled 2022-07-21: qty 2

## 2022-07-21 MED ORDER — POTASSIUM CHLORIDE 10 MEQ/100ML IV SOLN
10.0000 meq | INTRAVENOUS | Status: AC
  Administered 2022-07-21: 10 meq via INTRAVENOUS
  Filled 2022-07-21 (×2): qty 100

## 2022-07-21 NOTE — Progress Notes (Addendum)
[facility]  PROGRESS NOTE   Subjective:   Patient resting comfortably, alert and oriented Appetite remains poor. No family at bedside Offered protein supplements during the day.   Objective:  Vital signs: Blood pressure (!) 116/47, pulse 74, temperature 98.1 F (36.7 C), temperature source Oral, resp. rate 18, height '5\' 5"'$  (1.651 m), weight 67 kg, SpO2 93 %.  Intake/Output Summary (Last 24 hours) at 07/21/2022 1522 Last data filed at 07/21/2022 0300 Gross per 24 hour  Intake 200 ml  Output 1100 ml  Net -900 ml    Filed Weights   07/17/22 1248 07/20/22 0500 07/21/22 0500  Weight: 67.6 kg 69 kg 67 kg     Physical Exam: General:  No acute distress  Head:  Normocephalic, atraumatic. Moist oral mucosal membranes  Eyes:  Anicteric  Lungs:   Clear to auscultation, normal effort  Heart:  S1S2 no rubs  Abdomen:   Soft, nontender, bowel sounds present  Extremities:  Trace peripheral edema.  Neurologic:  Awake, alert, following commands  Skin:  No lesions  Access: None    Basic Metabolic Panel: Recent Labs  Lab 07/17/22 1257 07/17/22 1750 07/17/22 1816 07/18/22 0455 07/19/22 0554 07/20/22 0538 07/21/22 0657  NA 118*  --  121* 125* 128* 133* 134*  K 3.4*  --  3.2* 3.8 3.7  --  2.8*  CL 79*  --  83* 91* 97*  --  100  CO2 29  --  '28 28 26  '$ --  26  GLUCOSE 106*  --  101* 88 93  --  97  BUN 16  --  '13 10 12  '$ --  14  CREATININE 0.88  --  0.74 0.72 0.99  --  0.83  CALCIUM 9.1  --  8.3* 8.2* 8.4*  --  8.7*  MG 1.9  --   --   --  1.9  --  1.9  PHOS  --  3.1  --   --   --   --  3.8     CBC: Recent Labs  Lab 07/17/22 1257 07/18/22 0455  WBC 9.5 8.7  NEUTROABS 7.8*  --   HGB 13.5 11.6*  HCT 39.1 34.8*  MCV 88.5 91.1  PLT 201 200      Urinalysis: No results for input(s): "COLORURINE", "LABSPEC", "PHURINE", "GLUCOSEU", "HGBUR", "BILIRUBINUR", "KETONESUR", "PROTEINUR", "UROBILINOGEN", "NITRITE", "LEUKOCYTESUR" in the last 72 hours.  Invalid  input(s): "APPERANCEUR"    Imaging: DG Abd 1 View  Result Date: 07/20/2022 CLINICAL DATA:  [id number] Abdominal distention [id number] EXAM: ABDOMEN - 1 VIEW COMPARISON:  Ultrasound abdomen 07/17/2022, CT abdomen pelvis 03/13/2018 FINDINGS: Multiple tiny calcifications within the right upper quadrant consistent with gallstones. The bowel gas pattern is normal. No radio-opaque calculi or other significant radiographic abnormality are seen. Degenerative changes of bilateral hips. Degenerative changes of the lumbar spine with several levels demonstrating lumbar vertebral body height loss. IMPRESSION: 1. Nonobstructive bowel gas pattern. 2. Cholelithiasis. Electronically Signed   By: Iven Finn M.D.   On: 07/20/2022 19:07     Medications:    cefTRIAXone (ROCEPHIN)  IV 1 g (07/20/22 1807)    amiodarone  100 mg Oral Daily   amLODipine  10 mg Oral Daily   apixaban  2.5 mg Oral BID   calcium-vitamin D  1 tablet Oral BID   doxycycline  100 mg Oral Q12H   metoprolol tartrate  50 mg Oral Daily   mometasone-formoterol  2 puff Inhalation BID   potassium chloride  40 mEq Oral BID   scopolamine  1 patch Transdermal Q72H   senna-docusate  2 tablet Oral BID   sodium chloride  1 g Oral BID WC    Assessment/ Plan:     Principal Problem:   CAP (community acquired pneumonia) Active Problems:   Hypertension   Hyponatremia   Depression   Hypokalemia   CAD (coronary artery disease)   HLD (hyperlipidemia)   Atrial fibrillation, chronic (HCC)   Prolonged QT interval   Nausea vomiting and diarrhea   Abnormal LFTs   Chronic diastolic CHF (congestive heart failure) ([place])   Fall at home, initial encounter   Viral gastroenteritis   Paroxysmal atrial fibrillation ([place])  88 year old female with history of hypertension, coronary artery disease, atrial fibrillation, congestive heart failure, GERD, osteoarthritis now admitted with history of cough, nausea, vomiting and found to have hyponatremia and  pneumonia.   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  There may be a component of prerenal azotemia.  Sodium improved to 134. Continue salt tabs as prescribed and patient encouraged to maintain oral intake.    #2: Pneumonia: Remains on Rocephin and doxycycline.   #3: Hypertension/CHF: Continue present antihypertensive medications.  Continue fluid restriction to 1 L daily.Blood pressure stable for this patient   Due to corrected sodium levels, we will sign off at this time.  Feel free to contact us with further questions or concerns.   LOS: [facility] 10/23/20233:22 PM

## 2022-07-21 NOTE — Progress Notes (Signed)
PT Cancellation Note  Patient Details Name: [name] MRN: [id number] DOB: 1927/06/08   Cancelled Treatment:     PT attempted twice to provide skilled PT interventions and pt refused 2/2 to nausea and vomiting. Pt is unable to receive anti nausea as per nurse. Pt with flat affect but alert. Pastor by her side. Pt deferred for today and PT will attempt gain tomorrow if appropriate.    Joaquin Music PT DPT 4:13 PM,07/21/22

## 2022-07-21 NOTE — Progress Notes (Addendum)
  Progress Note   Patient: [name] QPR:[id number] DOB: Aug 31, 1927 DOA: 07/17/2022     4 DOS: the patient was seen and examined on 07/21/2022   Brief hospital course: [name] is a 88 y.o. female with medical history significant of hypertension, hyperlipidemia, GERD, depression, CHF, atrial fibrillation on Eliquis, CAD, non-STEMI, who presents with cough, nausea, vomiting. Chest x-ray showed mild infiltration in the right upper lobe concerning for pneumonia. Patient is started on antibiotics with Rocephin and doxycycline. Patient currently lives alone at home, she had a fall 2 weeks ago, she has generalized weakness.  Assessment and Plan:  Right upper lobe community acquired pneumonia  Patient currently does not have hypoxemia, no sepsis. Blood cultures have no growth. Will complete 5 days of antibiotics.   Constipation. Nausea vomiting secondary to constipation. Patient was nauseated last night, she also feel constipated she was given enema and lactulose, she had 2 large bowel movements, nausea vomiting has resolved.  Acute gastroenteritis. Hyponatremia secondary to dehydration. Hypokalemia secondary to nausea vomiting diarrhea. Continue replete potassium, sodium is better.  May consider discontinue salt tablets tomorrow.     Generalized weakness and fall. Patient need nursing home placement.   Chronic diastolic congestive heart failure No evidence of exacerbation.   Paroxysmal atrial fibrillation. Currently in sinus rhythm, continue anticoagulation.   Essential hypertension. On metoprolol, also added and amlodipine.   Prolonged QT interval. Continue to monitor.      Subjective:  Patient was nauseous and vomiting last night.  Received enema, had large bowel movements, symptoms resolved afterwards. No short of breath.  Physical Exam: Vitals:   07/21/22 0026 07/21/22 0333 07/21/22 0500 07/21/22 0722  BP: (!) 118/42 (!) 116/50  (!) 120/48  Pulse: 68 69  67   Resp: '16 14  16  '$ Temp: 98.9 F (37.2 C) 98.6 F (37 C)  98.6 F (37 C)  TempSrc:  Oral  Oral  SpO2: 94% 95%  95%  Weight:   67 kg   Height:       General exam: Appears calm and comfortable, frail Respiratory system: Clear to auscultation. Respiratory effort normal. Cardiovascular system: Irregular. No JVD, murmurs, rubs, gallops or clicks. No pedal edema. Gastrointestinal system: Abdomen is nondistended, soft and nontender. No organomegaly or masses felt. Normal bowel sounds heard. Central nervous system: Alert and oriented. No focal neurological deficits. Extremities: Symmetric 5 x 5 power. Skin: No rashes, lesions or ulcers Psychiatry: Judgement and insight appear normal. Mood & affect appropriate.   Data Reviewed:  Lab results reviewed.  Family Communication:   Disposition: Status is: Inpatient Remains inpatient appropriate because: Severity of disease, IV infusion, unsafe discharge.  Planned Discharge Destination: Skilled nursing facility    Time spent: 35 minutes  Author: Sharen Hones, MD 07/21/2022 12:28 PM  For on call review [email or web address].

## 2022-07-21 NOTE — Progress Notes (Signed)
Occupational Therapy Treatment Patient Details Name: [name] MRN: [id number] DOB: 09/26/1927 Today's Date: 07/21/2022   History of present illness Per MD: [name] is a 88 y.o. female with past medical history of fibrillation on Eliquis, hypertension, CHF, CAD, who presents to the emergency department with productive cough, nausea and vomiting ongoing for approximately 1 month.  She was seen by her outside provider at the end of September and was prescribed doxycycline and prednisone for sinus congestion and presumed pneumonia at that time.  She was then seen by her doctor about 1 week ago for follow-up and was ordered for chest x-ray and documentation they are suspicious for viral infection.   OT comments  See flowsheet below for details; multiple ADLs performed during session. Pt with bowel incontinence on bed pad; dependent on OT for changing bed pad; performed hygiene MOD A in standing at EOB. At beginning of session, pt very slow in taking her pills and finishing up her meals. Performs all ADLs and transfers at very slow pace. Very flat affect, but agreeable and cooperative during session. Able to stand at EOB for approx 7 minutes for hygiene; rest break required prior to standing at sink for approx 12 minutes for grooming at sink. Used BSC next to bed. Left in supine, all needs in reach, pt appearing very fatigued (and agreeing when OT asked if she was fatigued). Education on how to call RN for assistance. Bed alarm on.    Recommendations for follow up therapy are one component of a multi-disciplinary discharge planning process, led by the attending physician.  Recommendations may be updated based on patient status, additional functional criteria and insurance authorization.    Follow Up Recommendations  Skilled nursing-short term rehab (<3 hours/day) (pt in agreement and stating she does not have assistance at home and she feels weak.)    Assistance Recommended at Discharge Frequent or  constant Supervision/Assistance  Patient can return home with the following  A little help with walking and/or transfers;A lot of help with bathing/dressing/bathroom;Assistance with cooking/housework;Assistance with feeding;Direct supervision/assist for medications management;Direct supervision/assist for financial management;Assist for transportation;Help with stairs or ramp for entrance   Equipment Recommendations   (Pt has all needed DME)    Recommendations for Other Services      Precautions / Restrictions Precautions Precautions: Fall Precaution Comments: Pt has hx of one recent fall 2 weeks ago; denies other falls in the past few months. Restrictions Weight Bearing Restrictions: No       Mobility Bed Mobility Overal bed mobility: Modified Independent Bed Mobility: Supine to Sit, Sit to Supine     Supine to sit: HOB elevated, Min assist Sit to supine: Supervision   General bed mobility comments: Pt taking much increased time for bed mobility; pulling on OT's hand for t/f to EOB.    Transfers Overall transfer level: Needs assistance Equipment used: Rolling walker (2 wheels) Transfers: Sit to/from Stand Sit to Stand: Supervision, From elevated surface           General transfer comment: Pt able to t/f with supervision and RW in front, extra time, HOB elevated. Also t/f from [facility] next to bed with same level of assist. Pt with decreased confidence, as prior to t/f onto [facility] stated "that looks low. I won't be able to get up." OT provided encouragement and reassurance that pt will be able to use the rails to push up, and later she was able to do so.     Balance Overall balance assessment: Needs  assistance Sitting-balance support: No upper extremity supported, Feet unsupported       Standing balance support: Single extremity supported, During functional activity (Occasionally able to take BIL UE off of RW during standing activity for grooming at sink.)                                ADL either performed or assessed with clinical judgement   ADL Overall ADL's : Needs assistance/impaired Eating/Feeding: Set up   Grooming: Min guard;Set up;Standing;Brushing hair;Oral care (combed hair from seated; other ADLs standing at sink) Grooming Details (indicate cue type and reason): all items set up for pt; sink a 2 foot walk from bed. Standing tolerance approx 12 minutes at sink.                 Toilet Transfer: Min guard;Cueing for safety;Cueing for sequencing;BSC/3in1 (BSC next to bed)   Toileting- Clothing Manipulation and Hygiene: Moderate assistance Toileting - Clothing Manipulation Details (indicate cue type and reason): Pt able to wipe front thoroughly from seated and standing; required MOD A overall due to need for OT assist for bowel hygiene thoroughness.            Extremity/Trunk Assessment Upper Extremity Assessment Upper Extremity Assessment: Overall WFL for tasks assessed   Lower Extremity Assessment Lower Extremity Assessment: Defer to PT evaluation;Overall WFL for tasks assessed        Vision       Perception     Praxis      Cognition Arousal/Alertness: Awake/alert Behavior During Therapy: Flat affect, WFL for tasks assessed/performed Overall Cognitive Status: Within Functional Limits for tasks assessed                                 General Comments: Able to provide the day of the week; asking appropriate questions.        Exercises      Shoulder Instructions       General Comments      Pertinent Vitals/ Pain       Pain Assessment Pain Assessment: 0-10 Pain Score:  (unrated) Pain Location: back- lower (chronic pain) with activity Pain Descriptors / Indicators: Aching Pain Intervention(s): Limited activity within patient's tolerance, Repositioned  Home Living                                          Prior Functioning/Environment              Frequency   Min 2X/week        Progress Toward Goals  OT Goals(current goals can now be found in the care plan section)  Progress towards OT goals: Progressing toward goals  Acute Rehab OT Goals Patient Stated Goal: Go to rehab OT Goal Formulation: With patient Time For Goal Achievement: 08/01/22 Potential to Achieve Goals: Good ADL Goals Pt Will Perform Grooming: with modified independence;standing Pt Will Perform Lower Body Dressing: with modified independence Pt Will Transfer to Toilet: with modified independence;bedside commode Pt Will Perform Toileting - Clothing Manipulation and hygiene: with modified independence  Plan Frequency remains appropriate    Co-evaluation                 AM-PAC OT "6 Clicks" Daily Activity     Outcome Measure  Help from another person eating meals?: None Help from another person taking care of personal grooming?: A Little (set up/SBA) Help from another person toileting, which includes using toliet, bedpan, or urinal?: A Lot Help from another person bathing (including washing, rinsing, drying)?: A Lot Help from another person to put on and taking off regular upper body clothing?: A Little Help from another person to put on and taking off regular lower body clothing?: A Lot 6 Click Score: 16    End of Session Equipment Utilized During Treatment: Rolling walker (2 wheels)  OT Visit Diagnosis: Muscle weakness (generalized) (M62.81)   Activity Tolerance Patient tolerated treatment well   Patient Left in bed;with call bell/phone within reach   Nurse Communication Mobility status;Other (comment) (ADL status.)        Time: 1856-3149 OT Time Calculation (min): 77 min  Charges: OT General Charges $OT Visit: 1 Visit OT Treatments $Self Care/Home Management : 68-82 mins  Waymon Amato, MS, OTR/L   Vania Rea 07/21/2022, 10:29 AM

## 2022-07-22 DIAGNOSIS — I482 Chronic atrial fibrillation, unspecified: Secondary | ICD-10-CM | POA: Diagnosis not present

## 2022-07-22 DIAGNOSIS — J189 Pneumonia, unspecified organism: Secondary | ICD-10-CM | POA: Diagnosis not present

## 2022-07-22 DIAGNOSIS — I5032 Chronic diastolic (congestive) heart failure: Secondary | ICD-10-CM | POA: Diagnosis not present

## 2022-07-22 LAB — CULTURE, BLOOD (ROUTINE X 2)
Culture: NO GROWTH
Culture: NO GROWTH
Special Requests: ADEQUATE

## 2022-07-22 LAB — BASIC METABOLIC PANEL
Anion gap: 4 — ABNORMAL LOW (ref 5–15)
BUN: 16 mg/dL (ref 8–23)
CO2: 26 mmol/L (ref 22–32)
Calcium: 8.7 mg/dL — ABNORMAL LOW (ref 8.9–10.3)
Chloride: 103 mmol/L (ref 98–111)
Creatinine, Ser: 0.89 mg/dL (ref 0.44–1.00)
GFR, Estimated: >60 mL/min (ref >60)
Glucose, Bld: 90 mg/dL (ref 70–99)
Potassium: 4.6 mmol/L (ref 3.5–5.1)
Sodium: 133 mmol/L — ABNORMAL LOW (ref 135–145)

## 2022-07-22 LAB — MAGNESIUM: Magnesium: 1.7 mg/dL (ref 1.7–2.4)

## 2022-07-22 MED ORDER — LACTULOSE 10 GM/15ML PO SOLN
20.0000 g | Freq: Once | ORAL | Status: AC
  Administered 2022-07-22: 20 g via ORAL
  Filled 2022-07-22: qty 30

## 2022-07-22 MED ORDER — PANTOPRAZOLE SODIUM 40 MG PO TBEC
40.0000 mg | DELAYED_RELEASE_TABLET | Freq: Every day | ORAL | Status: DC
  Administered 2022-07-22 – 2022-07-24 (×3): 40 mg via ORAL
  Filled 2022-07-22 (×4): qty 1

## 2022-07-22 NOTE — Care Management Important Message (Signed)
Important Message  Patient Details  Name: [name] MRN: [id number] Date of Birth: 06-09-1927   Medicare Important Message Given:  Yes  Patient asleep upon time of visit.  Copy of Medicare IM left in room for reference.   Dannette Barbara 07/22/2022, 11:43 AM

## 2022-07-22 NOTE — Progress Notes (Signed)
  Progress Note   Patient: [name] EFE:[id number] DOB: April 24, 1927 DOA: 07/17/2022     5 DOS: the patient was seen and examined on 07/22/2022   Brief hospital course: [name] is a 88 y.o. female with medical history significant of hypertension, hyperlipidemia, GERD, depression, CHF, atrial fibrillation on Eliquis, CAD, non-STEMI, who presents with cough, nausea, vomiting. Chest x-ray showed mild infiltration in the right upper lobe concerning for pneumonia. Patient is started on antibiotics with Rocephin and doxycycline. Patient currently lives alone at home, she had a fall 2 weeks ago, she has generalized weakness. 10/24.  Completed antibiotics, pending nursing placement.  Assessment and Plan:  Right upper lobe community acquired pneumonia  Patient currently does not have hypoxemia, no sepsis. Blood cultures have no growth. Completed a course of antibiotics.   Constipation. Nausea vomiting secondary to constipation. Patient still has nausea and poor appetite, added Protonix.  Also started senna yesterday, give additional dose of lactulose.   Acute gastroenteritis. Hyponatremia secondary to dehydration. Hypokalemia secondary to nausea vomiting diarrhea. Condition improving, patient is followed by nephrology.  Still on salt tablets.  Sodium 133 today.     Generalized weakness and fall. Patient need nursing home placement.   Chronic diastolic congestive heart failure No evidence of exacerbation.   Paroxysmal atrial fibrillation. Currently in sinus rhythm, continue anticoagulation.   Essential hypertension. On metoprolol, also added and amlodipine.   Prolonged QT interval. Continue to monitor.        Subjective:  Patient is still complaining of nausea, but appetite seems to be better, ate 100% of breakfast today. Still complaining of constipation.  Physical Exam: Vitals:   07/22/22 0421 07/22/22 0500 07/22/22 0758 07/22/22 1129  BP: (!) 139/52  (!) 159/66 (!)  136/46  Pulse: (!) 55  (!) 57 (!) 59  Resp: '14  14 16  '$ Temp: 98.7 F (37.1 C)  98.2 F (36.8 C) 98.1 F (36.7 C)  TempSrc: Oral   Oral  SpO2: 95%  97% 99%  Weight:  67.2 kg    Height:       General exam: Appears calm and comfortable, frail  Respiratory system: Clear to auscultation. Respiratory effort normal. Cardiovascular system: S1 & S2 heard, RRR. No JVD, murmurs, rubs, gallops or clicks. No pedal edema. Gastrointestinal system: Abdomen is nondistended, soft and nontender. No organomegaly or masses felt. Normal bowel sounds heard. Central nervous system: Alert and oriented x3. No focal neurological deficits. Extremities: Symmetric 5 x 5 power. Skin: No rashes, lesions or ulcers Psychiatry: Mood & affect appropriate.   Data Reviewed:  Lab results reviewed.  Family Communication: None  Disposition: Status is: Inpatient Remains inpatient appropriate because: Unsafe discharge, pending nursing placement.  Planned Discharge Destination: Skilled nursing facility    Time spent: 30 minutes  Author: Sharen Hones, MD 07/22/2022 3:24 PM  For on call review [email or web address].

## 2022-07-22 NOTE — Progress Notes (Signed)
Physical Therapy Treatment Patient Details Name: [name] MRN: [id number] DOB: 1926/10/09 Today's Date: 07/22/2022   History of Present Illness Per MD: [name] is a 88 y.o. female with past medical history of fibrillation on Eliquis, hypertension, CHF, CAD, who presents to the emergency department with productive cough, nausea and vomiting ongoing for approximately 1 month.  She was seen by her outside provider at the end of September and was prescribed doxycycline and prednisone for sinus congestion and presumed pneumonia at that time.  She was then seen by her doctor about 1 week ago for follow-up and was ordered for chest x-ray and documentation they are suspicious for viral infection.    PT Comments    Pt received in bed agreeable to PT interventions. Pt c/o nausea and gaging. Large bruises in L post trunk and BLE edema noted. Pt supine to sit with CGA to sup and sat on EOB with sup. STS with forward flexed posture with Min assist of 1. Transferred to chair with Min assist of 1 with RW. Pt stood twice for 1 mins and 30 secs  with CGA of 1 with heavy reliance on RW. AROM to BLE and bridges provided and same for HEP every 4 hours. Pt able to recall exs well. Pt is weak and difficulty swallowing noted. Pt may benefit from Speech evaluation. Pt lives lone and was Ind prior to this  hospitalization. Pt will benefit from SNF to return to PLOF after acute care. .   Recommendations for follow up therapy are one component of a multi-disciplinary discharge planning process, led by the attending physician.  Recommendations may be updated based on patient status, additional functional criteria and insurance authorization.  Follow Up Recommendations  Skilled nursing-short term rehab (<3 hours/day)     Assistance Recommended at Discharge Frequent or constant Supervision/Assistance  Patient can return home with the following A lot of help with walking and/or transfers;A lot of help with  bathing/dressing/bathroom;Assistance with cooking/housework;Direct supervision/assist for medications management;Direct supervision/assist for financial management;Assist for transportation;Help with stairs or ramp for entrance   Equipment Recommendations       Recommendations for Other Services Speech consult     Precautions / Restrictions Precautions Precautions: Fall Restrictions Weight Bearing Restrictions: No     Mobility  Bed Mobility Overal bed mobility: Modified Independent Bed Mobility: Supine to Sit     Supine to sit: HOB elevated, Min assist     General bed mobility comments: slow    Transfers Overall transfer level: Needs assistance Equipment used: Rolling walker (2 wheels) Transfers: Sit to/from Stand, Bed to chair/wheelchair/BSC Sit to Stand: Supervision, From elevated surface   Step pivot transfers: Min assist       General transfer comment:  (set up help)    Ambulation/Gait Ambulation/Gait assistance:  (unable 2/2 pt became nauseaus)                 Stairs             Wheelchair Mobility    Modified Rankin (Stroke Patients Only)       Balance Overall balance assessment: Needs assistance Sitting-balance support: No upper extremity supported, Feet unsupported Sitting balance-Leahy Scale: Good     Standing balance support: Reliant on assistive device for balance, Bilateral upper extremity supported Standing balance-Leahy Scale: Fair                              Cognition Arousal/Alertness: Awake/alert Behavior  During Therapy: Flat affect, WFL for tasks assessed/performed Overall Cognitive Status: Within Functional Limits for tasks assessed                                 General Comments: Able to provide the day of the week; asking appropriate questions.        Exercises Total Joint Exercises Ankle Circles/Pumps: AROM, 10 reps, Seated Gluteal Sets: Seated General Exercises - Lower  Extremity Heel Slides: AROM, 10 reps, Seated    General Comments        Pertinent Vitals/Pain Pain Assessment Pain Assessment: No/denies pain    Home Living                          Prior Function            PT Goals (current goals can now be found in the care plan section) Acute Rehab PT Goals Patient Stated Goal: to go back home. PT Goal Formulation: With patient Time For Goal Achievement: 08/01/22 Potential to Achieve Goals: Good Progress towards PT goals: Progressing toward goals    Frequency    Min 2X/week      PT Plan Current plan remains appropriate    Co-evaluation              AM-PAC PT "6 Clicks" Mobility   Outcome Measure  Help needed turning from your back to your side while in a flat bed without using bedrails?: None Help needed moving from lying on your back to sitting on the side of a flat bed without using bedrails?: A Little Help needed moving to and from a bed to a chair (including a wheelchair)?: A Lot Help needed standing up from a chair using your arms (e.g., wheelchair or bedside chair)?: A Little Help needed to walk in hospital room?: A Lot Help needed climbing 3-5 steps with a railing? : Total 6 Click Score: 15    End of Session Equipment Utilized During Treatment: Gait belt Activity Tolerance: Patient tolerated treatment well (limited due top nausea) Patient left: in chair;with chair alarm set;with nursing/sitter in room Nurse Communication: Mobility status PT Visit Diagnosis: Unsteadiness on feet (R26.81);Other abnormalities of gait and mobility (R26.89);Muscle weakness (generalized) (M62.81);History of falling (Z91.81);Difficulty in walking, not elsewhere classified (R26.2);Pain     Time: 7824-2353 PT Time Calculation (min) (ACUTE ONLY): 30 min  Charges:  $Therapeutic Exercise: 8-22 mins $Therapeutic Activity: 8-22 mins                    Joaquin Music PT DPT 5:05 PM,07/22/22

## 2022-07-23 ENCOUNTER — Inpatient Hospital Stay: Payer: Medicare HMO

## 2022-07-23 DIAGNOSIS — J189 Pneumonia, unspecified organism: Secondary | ICD-10-CM | POA: Diagnosis not present

## 2022-07-23 DIAGNOSIS — R531 Weakness: Secondary | ICD-10-CM

## 2022-07-23 DIAGNOSIS — I480 Paroxysmal atrial fibrillation: Secondary | ICD-10-CM

## 2022-07-23 DIAGNOSIS — R42 Dizziness and giddiness: Secondary | ICD-10-CM | POA: Diagnosis not present

## 2022-07-23 DIAGNOSIS — I482 Chronic atrial fibrillation, unspecified: Secondary | ICD-10-CM | POA: Diagnosis not present

## 2022-07-23 MED ORDER — MAGNESIUM OXIDE -MG SUPPLEMENT 400 (240 MG) MG PO TABS
400.0000 mg | ORAL_TABLET | Freq: Two times a day (BID) | ORAL | Status: DC
  Administered 2022-07-23 – 2022-07-24 (×3): 400 mg via ORAL
  Filled 2022-07-23 (×3): qty 1

## 2022-07-23 MED ORDER — METOPROLOL SUCCINATE ER 25 MG PO TB24
25.0000 mg | ORAL_TABLET | Freq: Every day | ORAL | Status: DC
  Administered 2022-07-24: 25 mg via ORAL
  Filled 2022-07-23: qty 1

## 2022-07-23 NOTE — Assessment & Plan Note (Signed)
Recheck LFTs tomorrow

## 2022-07-23 NOTE — Assessment & Plan Note (Signed)
Completed antibiotics 

## 2022-07-23 NOTE — Progress Notes (Addendum)
PT Cancellation Note  Patient Details Name: [name] MRN: [id number] DOB: [birth year]   Cancelled Treatment:     Pt did not receive PT treatment today because pt refused 2/2 to feeling dizzy, tired  and sleepy. Pt adjusted RW height to allow in room mobility with nursing care as needed. Pt requested to be consulted tomorrow. PT will return tomorrow.   Joaquin Music PT DPT 2:36 PM,07/23/22  Joaquin Music PT DPT 2:34 PM,07/23/22

## 2022-07-23 NOTE — TOC Progression Note (Signed)
Transition of Care [facility]) - Progression Note    Patient Details  Name: [name] MRN: [id number] Date of Birth: March 12, 1927  Transition of [facility]) CM/SW Contact  Beverly Sessions, RN Phone Number: 07/23/2022, 4:17 PM  Clinical Narrative:      Offers presented [employer] accepted  Auth started in navi portal   Expected Discharge Plan: [place] Barriers to Discharge: Continued Medical Work up  Expected Discharge Plan and Services Expected Discharge Plan: [place] In-house Referral: Clinical Social Work   Post Acute Care Choice: [place] Living arrangements for the past 2 months: Single Family Home                                       Social Determinants of Health (SDOH) Interventions    Readmission Risk Interventions     No data to display

## 2022-07-23 NOTE — Assessment & Plan Note (Signed)
Resolved

## 2022-07-23 NOTE — TOC Progression Note (Signed)
Transition of Care [facility]) - Progression Note    Patient Details  Name: Inella Kuwahara MRN: [id number] Date of Birth: [birth year]  Transition [facility]) CM/SW Contact  Beverly Sessions, RN Phone Number: 07/23/2022, 9:35 AM  Clinical Narrative:     Therapy new rec is for SNF [employer] listed as considering in the [place] Message sent to [employer] to determine if they can offer a bed Also extended bed search   Expected Discharge Plan: [employer] Barriers to Discharge: Continued Medical Work up  Expected Discharge Plan and Services Expected Discharge Plan: [place] In-house Referral: Clinical Social Work   Post Acute Care Choice: [place] Living arrangements for the past 2 months: Single Family Home                                       Social Determinants of Health (SDOH) Interventions    Readmission Risk Interventions     No data to display

## 2022-07-23 NOTE — Assessment & Plan Note (Addendum)
Patient also had dizziness today.  Check orthostatic vital signs.  Cut back beta-blocker to 25 mg daily.  Physical therapy recommending rehab.  Looks like someone started her on scopolamine patch.

## 2022-07-23 NOTE — Assessment & Plan Note (Addendum)
Sodium 135 today.  Currently on salt tablets.

## 2022-07-23 NOTE — TOC Progression Note (Signed)
Transition of Care [facility]) - Progression Note    Patient Details  Name: [name] MRN: [id number] Date of Birth: 08/21/1927  Transition of Care [facility]) CM/SW Contact  Beverly Sessions, RN Phone Number: 07/23/2022, 3:52 PM  Clinical Narrative:    Insurance auth started in [place] portal Pending auth ID [id number]   Expected Discharge Plan: [place] Barriers to Discharge: Continued Medical Work up  Expected Discharge Plan and Services Expected Discharge Plan: [place] In-house Referral: Clinical Social Work   Post Acute Care Choice: [place] Living arrangements for the past 2 months: Single Family Home                                       Social Determinants of Health (SDOH) Interventions    Readmission Risk Interventions     No data to display

## 2022-07-23 NOTE — Assessment & Plan Note (Signed)
Continue amiodarone, Eliquis and Toprol

## 2022-07-23 NOTE — Assessment & Plan Note (Signed)
Patient is not orthostatic.  Currently on Norvasc and Toprol.

## 2022-07-23 NOTE — Progress Notes (Signed)
Occupational Therapy Treatment Patient Details Name: [name] MRN: [id number] DOB: [birth year] Today's Date: 07/23/2022   History of present illness Per MD: Estell Puccini is a 88 y.o. female with past medical history of fibrillation on Eliquis, hypertension, CHF, CAD, who presents to the emergency department with productive cough, nausea and vomiting ongoing for approximately 1 month.  She was seen by her outside provider at the end of September and was prescribed doxycycline and prednisone for sinus congestion and presumed pneumonia at that time.  She was then seen by her doctor about 1 week ago for follow-up and was ordered for chest x-ray and documentation they are suspicious for viral infection.   OT comments  Upon entering session, pt resting in bed and agreeable to OT. Once sitting EOB, pt reporting feeling dizzy/lightheaded. Pt returned to supine in order for OT to retrieve vitals machine, in supine BP was 160/56 (MAP 84). Encouraged pt to attempt sitting EOB again, however, deferred. Agreeable to complete bed level grooming tasks with set up A provided. BP checked again in supine at end of session and was 173/61 (91). RN aware. Pt left as received with all needs in reach. Pt is making progress toward goal completion. D/C recommendation remains appropriate. OT will continue to follow acutely.    Recommendations for follow up therapy are one component of a multi-disciplinary discharge planning process, led by the attending physician.  Recommendations may be updated based on patient status, additional functional criteria and insurance authorization.    Follow Up Recommendations  Skilled nursing-short term rehab (<3 hours/day)    Assistance Recommended at Discharge Frequent or constant Supervision/Assistance  Patient can return home with the following  A little help with walking and/or transfers;A lot of help with bathing/dressing/bathroom;Assistance with cooking/housework;Assistance with  feeding;Direct supervision/assist for medications management;Direct supervision/assist for financial management;Assist for transportation;Help with stairs or ramp for entrance   Equipment Recommendations       Recommendations for Other Services      Precautions / Restrictions Precautions Precautions: Fall Restrictions Weight Bearing Restrictions: No       Mobility Bed Mobility Overal bed mobility: Needs Assistance Bed Mobility: Supine to Sit     Supine to sit: HOB elevated, Min assist Sit to supine: Supervision        Transfers                   General transfer comment: pt deferred     Balance Overall balance assessment: Needs assistance Sitting-balance support: No upper extremity supported, Feet unsupported Sitting balance-Leahy Scale: Good         Standing balance comment: pt deferred                           ADL either performed or assessed with clinical judgement   ADL Overall ADL's : Needs assistance/impaired     Grooming: Bed level;Wash/dry face;Oral care;Set up;Supervision/safety               Lower Body Dressing: Maximal assistance;Bed level Lower Body Dressing Details (indicate cue type and reason): for socks                    Extremity/Trunk Assessment Upper Extremity Assessment Upper Extremity Assessment: Generalized weakness   Lower Extremity Assessment Lower Extremity Assessment: Generalized weakness        Vision Patient Visual Report: No change from baseline     Perception     Praxis  Cognition Arousal/Alertness: Awake/alert Behavior During Therapy: Flat affect, WFL for tasks assessed/performed Overall Cognitive Status: Within Functional Limits for tasks assessed                                          Exercises      Shoulder Instructions       General Comments      Pertinent Vitals/ Pain       Pain Assessment Pain Assessment: No/denies pain  Home Living                                           Prior Functioning/Environment              Frequency  Min 2X/week        Progress Toward Goals  OT Goals(current goals can now be found in the care plan section)  Progress towards OT goals: Progressing toward goals  Acute Rehab OT Goals Patient Stated Goal: go to rehab OT Goal Formulation: With patient Time For Goal Achievement: 08/01/22 Potential to Achieve Goals: Good  Plan Frequency remains appropriate;Discharge plan remains appropriate    Co-evaluation                 AM-PAC OT "6 Clicks" Daily Activity     Outcome Measure   Help from another person eating meals?: None Help from another person taking care of personal grooming?: A Little Help from another person toileting, which includes using toliet, bedpan, or urinal?: A Lot Help from another person bathing (including washing, rinsing, drying)?: A Lot Help from another person to put on and taking off regular upper body clothing?: A Little Help from another person to put on and taking off regular lower body clothing?: A Lot 6 Click Score: 16    End of Session    OT Visit Diagnosis: Muscle weakness (generalized) (M62.81)   Activity Tolerance Patient limited by fatigue   Patient Left in bed;with call bell/phone within reach;with bed alarm set   Nurse Communication Mobility status        Time: 4268-3419 OT Time Calculation (min): 29 min  Charges: OT General Charges $OT Visit: 1 Visit OT Treatments $Self Care/Home Management : 23-37 mins  [facility] MS, OTR/L ascom [phone]  07/23/22, 2:39 PM

## 2022-07-23 NOTE — Assessment & Plan Note (Signed)
Replaced. °

## 2022-07-23 NOTE — Progress Notes (Signed)
  Progress Note   Patient: [name] PRF:[id number] DOB: May 29, 1927 DOA: 07/17/2022     6 DOS: the patient was seen and examined on 07/23/2022   Brief hospital course: [name] is a 88 y.o. female with medical history significant of hypertension, hyperlipidemia, GERD, depression, CHF, atrial fibrillation on Eliquis, CAD, non-STEMI, who presents with cough, nausea, vomiting. Chest x-ray showed mild infiltration in the right upper lobe concerning for pneumonia. Patient is started on antibiotics with Rocephin and doxycycline. Patient currently lives alone at home, she had a fall 2 weeks ago, she has generalized weakness. 10/24.  Completed antibiotics, pending nursing placement.  Assessment and Plan: * Weakness Patient also had dizziness today.  Check orthostatic vital signs.  Cut back beta-blocker to 25 mg daily.  Physical therapy recommending rehab.  Looks like someone started her on scopolamine patch.  CAP (community acquired pneumonia) Completed antibiotics.  Nausea vomiting and diarrhea Resolved  Hyponatremia Sodium 133 today.  Currently on salt tablets.  Atrial fibrillation, chronic (HCC) Continue amiodarone, Eliquis and Toprol  Chronic diastolic CHF (congestive heart failure) (HCC) Holding water pills currently.  Hypertension Check orthostatic vital signs.  Currently on Norvasc and Toprol.  Hypokalemia Replaced  Abnormal LFTs Recheck LFTs tomorrow        Subjective: Patient did not feel a great this morning.  Had some dizziness when physical therapy came by.  Admitted with pneumonia.  Physical Exam: Vitals:   07/22/22 1613 07/22/22 2041 07/23/22 0500 07/23/22 0805  BP: (!) 153/54 (!) 140/51  (!) 152/55  Pulse: (!) 58 (!) 58  (!) 58  Resp: 16 16    Temp: 98.2 F (36.8 C) 98.2 F (36.8 C)  98.8 F (37.1 C)  TempSrc: Oral Oral  Oral  SpO2: 95% 96%  94%  Weight:   67.5 kg   Height:       Physical Exam HENT:     Head: Normocephalic.     Mouth/Throat:      Pharynx: No oropharyngeal exudate.  Eyes:     General: Lids are normal.     Conjunctiva/sclera: Conjunctivae normal.  Cardiovascular:     Rate and Rhythm: Normal rate and regular rhythm.     Heart sounds: Normal heart sounds, S1 normal and S2 normal.  Pulmonary:     Breath sounds: No decreased breath sounds, wheezing, rhonchi or rales.  Abdominal:     Palpations: Abdomen is soft.     Tenderness: There is no abdominal tenderness.  Musculoskeletal:     Right lower leg: No swelling.     Left lower leg: No swelling.  Skin:    General: Skin is warm.     Findings: No rash.  Neurological:     Mental Status: She is alert and oriented to person, place, and time.     Data Reviewed: Sodium 133, creatinine 0.89  Family Communication: Spoke with patient's niece on phone  Disposition: Status is: Inpatient Remains inpatient appropriate because: Still awaiting a rehab bed  Planned Discharge Destination: Rehab    Time spent: 28 minutes  Author: Loletha Grayer, MD 07/23/2022 3:20 PM  For on call review [email or web address].

## 2022-07-23 NOTE — Assessment & Plan Note (Signed)
Holding water pills currently.

## 2022-07-24 DIAGNOSIS — A084 Viral intestinal infection, unspecified: Secondary | ICD-10-CM | POA: Diagnosis not present

## 2022-07-24 DIAGNOSIS — R7989 Other specified abnormal findings of blood chemistry: Secondary | ICD-10-CM | POA: Diagnosis not present

## 2022-07-24 DIAGNOSIS — J189 Pneumonia, unspecified organism: Secondary | ICD-10-CM | POA: Diagnosis not present

## 2022-07-24 DIAGNOSIS — E876 Hypokalemia: Secondary | ICD-10-CM | POA: Diagnosis not present

## 2022-07-24 DIAGNOSIS — I5032 Chronic diastolic (congestive) heart failure: Secondary | ICD-10-CM | POA: Diagnosis not present

## 2022-07-24 DIAGNOSIS — R531 Weakness: Secondary | ICD-10-CM | POA: Diagnosis not present

## 2022-07-24 DIAGNOSIS — R42 Dizziness and giddiness: Secondary | ICD-10-CM | POA: Diagnosis not present

## 2022-07-24 DIAGNOSIS — E871 Hypo-osmolality and hyponatremia: Secondary | ICD-10-CM | POA: Diagnosis not present

## 2022-07-24 DIAGNOSIS — R29898 Other symptoms and signs involving the musculoskeletal system: Secondary | ICD-10-CM | POA: Diagnosis not present

## 2022-07-24 DIAGNOSIS — I10 Essential (primary) hypertension: Secondary | ICD-10-CM | POA: Diagnosis not present

## 2022-07-24 DIAGNOSIS — I482 Chronic atrial fibrillation, unspecified: Secondary | ICD-10-CM | POA: Diagnosis not present

## 2022-07-24 DIAGNOSIS — I110 Hypertensive heart disease with heart failure: Secondary | ICD-10-CM | POA: Diagnosis not present

## 2022-07-24 DIAGNOSIS — J158 Pneumonia due to other specified bacteria: Secondary | ICD-10-CM | POA: Diagnosis not present

## 2022-07-24 DIAGNOSIS — J449 Chronic obstructive pulmonary disease, unspecified: Secondary | ICD-10-CM | POA: Diagnosis not present

## 2022-07-24 DIAGNOSIS — B370 Candidal stomatitis: Secondary | ICD-10-CM

## 2022-07-24 DIAGNOSIS — I480 Paroxysmal atrial fibrillation: Secondary | ICD-10-CM | POA: Diagnosis not present

## 2022-07-24 DIAGNOSIS — F3289 Other specified depressive episodes: Secondary | ICD-10-CM | POA: Diagnosis not present

## 2022-07-24 DIAGNOSIS — I5042 Chronic combined systolic (congestive) and diastolic (congestive) heart failure: Secondary | ICD-10-CM | POA: Diagnosis not present

## 2022-07-24 DIAGNOSIS — M6281 Muscle weakness (generalized): Secondary | ICD-10-CM | POA: Diagnosis not present

## 2022-07-24 DIAGNOSIS — I509 Heart failure, unspecified: Secondary | ICD-10-CM | POA: Diagnosis not present

## 2022-07-24 DIAGNOSIS — E440 Moderate protein-calorie malnutrition: Secondary | ICD-10-CM | POA: Diagnosis not present

## 2022-07-24 DIAGNOSIS — Z7401 Bed confinement status: Secondary | ICD-10-CM | POA: Diagnosis not present

## 2022-07-24 LAB — COMPREHENSIVE METABOLIC PANEL
ALT: 47 U/L — ABNORMAL HIGH (ref 0–44)
AST: 29 U/L (ref 15–41)
Albumin: 2.7 g/dL — ABNORMAL LOW (ref 3.5–5.0)
Alkaline Phosphatase: 74 U/L (ref 38–126)
Anion gap: 5 (ref 5–15)
BUN: 12 mg/dL (ref 8–23)
CO2: 26 mmol/L (ref 22–32)
Calcium: 8.8 mg/dL — ABNORMAL LOW (ref 8.9–10.3)
Chloride: 104 mmol/L (ref 98–111)
Creatinine, Ser: 0.69 mg/dL (ref 0.44–1.00)
GFR, Estimated: >60 mL/min (ref >60)
Glucose, Bld: 89 mg/dL (ref 70–99)
Potassium: 4.2 mmol/L (ref 3.5–5.1)
Sodium: 135 mmol/L (ref 135–145)
Total Bilirubin: 0.4 mg/dL (ref 0.3–1.2)
Total Protein: 5.1 g/dL — ABNORMAL LOW (ref 6.5–8.1)

## 2022-07-24 MED ORDER — AMIODARONE HCL 100 MG PO TABS: 100.0000 mg | ORAL_TABLET | Freq: Every day | ORAL | 0 refills | Status: DC

## 2022-07-24 MED ORDER — AMLODIPINE BESYLATE 10 MG PO TABS: 10.0000 mg | ORAL_TABLET | Freq: Every day | ORAL | 0 refills | Status: DC

## 2022-07-24 MED ORDER — ESCITALOPRAM OXALATE 10 MG PO TABS
10.0000 mg | ORAL_TABLET | Freq: Every day | ORAL | Status: DC
  Administered 2022-07-24: 10 mg via ORAL
  Filled 2022-07-24: qty 1

## 2022-07-24 MED ORDER — ESCITALOPRAM OXALATE 5 MG PO TABS: 5.0000 mg | ORAL_TABLET | Freq: Every day | ORAL | Status: DC

## 2022-07-24 MED ORDER — METOPROLOL SUCCINATE ER 25 MG PO TB24: 25.0000 mg | ORAL_TABLET | Freq: Every day | ORAL | 0 refills | Status: DC

## 2022-07-24 MED ORDER — ESCITALOPRAM OXALATE 10 MG PO TABS: 10.0000 mg | ORAL_TABLET | Freq: Every day | ORAL | 0 refills | Status: DC

## 2022-07-24 MED ORDER — SODIUM CHLORIDE 1 G PO TABS: 1.0000 g | ORAL_TABLET | Freq: Two times a day (BID) | ORAL | 0 refills | Status: DC

## 2022-07-24 MED ORDER — NYSTATIN 100000 UNIT/ML MT SUSP
5.0000 mL | Freq: Four times a day (QID) | OROMUCOSAL | Status: DC
  Administered 2022-07-24 (×2): 500000 [IU] via ORAL
  Filled 2022-07-24 (×2): qty 5

## 2022-07-24 MED ORDER — ACETAMINOPHEN 325 MG PO TABS: 325.0000 mg | ORAL_TABLET | Freq: Four times a day (QID) | ORAL | Status: DC | PRN

## 2022-07-24 MED ORDER — MAGNESIUM OXIDE -MG SUPPLEMENT 400 (240 MG) MG PO TABS: 400.0000 mg | ORAL_TABLET | Freq: Two times a day (BID) | ORAL | 0 refills | Status: DC

## 2022-07-24 MED ORDER — POTASSIUM CHLORIDE CRYS ER 20 MEQ PO TBCR: 20.0000 meq | EXTENDED_RELEASE_TABLET | Freq: Every day | ORAL | 0 refills | Status: DC

## 2022-07-24 MED ORDER — FLUCONAZOLE 100 MG PO TABS: 100.0000 mg | ORAL_TABLET | Freq: Every day | ORAL | 0 refills | Status: AC

## 2022-07-24 MED ORDER — SENNOSIDES-DOCUSATE SODIUM 8.6-50 MG PO TABS: 1.0000 | ORAL_TABLET | Freq: Two times a day (BID) | ORAL | 0 refills | Status: DC

## 2022-07-24 MED ORDER — FLUCONAZOLE 100 MG PO TABS
100.0000 mg | ORAL_TABLET | Freq: Every day | ORAL | Status: DC
  Administered 2022-07-24: 100 mg via ORAL
  Filled 2022-07-24: qty 1

## 2022-07-24 NOTE — Assessment & Plan Note (Signed)
Diflucan for 6 more days.

## 2022-07-24 NOTE — TOC Initial Note (Signed)
Transition of Care [facility]) - Initial/Assessment Note    Patient Details  Name: [name] MRN: [id number] Date of Birth: November 25, 1926  Transition of Care [facility]) CM/SW Contact:    Beverly Sessions, RN Phone Number: 07/24/2022, 10:37 AM  Clinical Narrative:                 Insurance auth approved Auth ID [id number] Approved 10/26-10/30  MD notified to determine if patient is ready for discharge Magda Paganini at [place] notified   Expected Discharge Plan: [place] Barriers to Discharge: Continued Medical Work up   Patient Goals and CMS Choice Patient states their goals for this hospitalization and ongoing recovery are:: to go to snf      Expected Discharge Plan and Services Expected Discharge Plan: [place] In-house Referral: Clinical Social Work   Post Acute Care Choice: [place] Living arrangements for the past 2 months: [place]                                      Prior Living Arrangements/Services Living arrangements for the past 2 months: Single Family Home Lives with:: Self Patient language and need for interpreter reviewed:: Yes Do you feel safe going back to the place where you live?: Yes      Need for Family Participation in Patient Care: Yes (Comment) Care giver support system in place?: Yes (comment)   Criminal Activity/Legal Involvement Pertinent to Current Situation/Hospitalization: No - Comment as needed  Activities of Daily Living   ADL Screening (condition at time of admission) Patient's cognitive ability adequate to safely complete daily activities?: Yes Is the patient deaf or have difficulty hearing?: No Does the patient have difficulty seeing, even when wearing glasses/contacts?: No Does the patient have difficulty concentrating, remembering, or making decisions?: No Patient able to express need for assistance with ADLs?: Yes Does the patient have difficulty dressing or bathing?:  No Independently performs ADLs?: Yes (appropriate for developmental age) Does the patient have difficulty walking or climbing stairs?: No Weakness of Legs: None Weakness of Arms/Hands: None  Permission Sought/Granted Permission sought to share information with : Family Supports Permission granted to share information with : Yes, Verbal Permission Granted  Share Information with NAME: cathy     Permission granted to share info w Relationship: neice     Emotional Assessment Appearance:: Appears stated age Attitude/Demeanor/Rapport: Engaged Affect (typically observed): Accepting Orientation: : Oriented to Situation, Oriented to  Time, Oriented to Place, Oriented to Self Alcohol / Substance Use: Not Applicable Psych Involvement: No (comment)  Admission diagnosis:  Hyponatremia [E87.1] CAP (community acquired pneumonia) [J18.9] Uncontrolled hypertension [I10] LFT elevation [R79.89] Pneumonia of right upper lobe due to infectious organism [J18.9] Nausea and vomiting, unspecified vomiting type [R11.2] Patient Active Problem List   Diagnosis Date Noted   Weakness 07/23/2022   Dizziness    Viral gastroenteritis 07/18/2022   Paroxysmal atrial fibrillation ([place]) 07/18/2022   CAP (community acquired pneumonia) 07/17/2022   CAD (coronary artery disease) 07/17/2022   HLD (hyperlipidemia) 07/17/2022   Atrial fibrillation, chronic ([place]) 07/17/2022   Prolonged QT interval 07/17/2022   Nausea vomiting and diarrhea 07/17/2022   Abnormal LFTs 07/17/2022   Chronic diastolic CHF (congestive heart failure) ([place]) 07/17/2022   Fall at home, initial encounter 07/17/2022   LFT elevation    Malnutrition of moderate degree 72/05/4708   Acute systolic CHF (congestive heart failure) ([place]) 08/30/2021  Diarrhea 08/29/2021   Depression 08/29/2021   Hypokalemia 08/29/2021   NSTEMI (non-ST elevated myocardial infarction) ([place]) 08/28/2021   AKI (acute kidney injury) ([place])    Hypoxia    RLL pneumonia  08/27/2020   Acute respiratory failure with hypoxia ([place]) 08/27/2020   Hyponatremia 08/27/2020   Community acquired pneumonia 08/27/2020   Atrial fibrillation with rapid ventricular response ([place]) 08/27/2020   DNR (do not resuscitate)/DNI(Do Not Intubate) 08/27/2020   SOB (shortness of breath) 06/30/2018   GERD (gastroesophageal reflux disease) 04/29/2018   Hypertension 03/02/2018   Arthritis 03/02/2018   Pain in limb 03/02/2018   HTN (hypertension), benign 01/08/2014   PCP:  Rusty Aus, MD Pharmacy:   [place], [place] - [address] [place] [address] Phone: [phone] Fax: [phone]     Social Determinants of Health (SDOH) Interventions    Readmission Risk Interventions     No data to display

## 2022-07-24 NOTE — Progress Notes (Signed)
Amy with [employer] called for report.   Fuller Mandril, RN

## 2022-07-24 NOTE — TOC Transition Note (Signed)
Transition of Care [facility]) - CM/SW Discharge Note   Patient Details  Name: [name] MRN: [id number] Date of Birth: [birth year]  Transition of Care [facility]) CM/SW Contact:  Beverly Sessions, RN Phone Number: 07/24/2022, 2:33 PM   Clinical Narrative:     Patient now needing EMS transport EMS packet printed and DNR on chart. Bedside RN to obtain EMS packet EMS transport called 2nd on list    Barriers to Discharge: Continued Medical Work up   Patient Goals and CMS Choice Patient states their goals for this hospitalization and ongoing recovery are:: to go to snf      Discharge Placement                       Discharge Plan and Services In-house Referral: Clinical Social Work   Post Acute Care Choice: [place]                               Social Determinants of Health (SDOH) Interventions     Readmission Risk Interventions     No data to display

## 2022-07-24 NOTE — Discharge Summary (Addendum)
Physician Discharge Summary   Patient: [name] MRN: [id number] DOB: [birth year]  Admit date:     07/17/2022  Discharge date: 07/24/22  Discharge Physician: Loletha Grayer   PCP: Rusty Aus, MD   Recommendations at discharge:   Follow-up team at rehab 1 day  Discharge Diagnoses: Principal Problem:   Weakness Active Problems:   CAP (community acquired pneumonia)   Nausea vomiting and diarrhea   Hyponatremia   CAD (coronary artery disease)   Atrial fibrillation, chronic (HCC)   Chronic diastolic CHF (congestive heart failure) (HCC)   HLD (hyperlipidemia)   Hypertension   Hypokalemia   Abnormal LFTs   Fall at home, initial encounter   Depression   Prolonged QT interval   Viral gastroenteritis   Paroxysmal atrial fibrillation [facility])   Dizziness   Thrush    Hospital Course: [name] is a 88 y.o. female with medical history significant of hypertension, hyperlipidemia, GERD, depression, CHF, atrial fibrillation on Eliquis, CAD, non-STEMI, who presents with cough, nausea, vomiting. Chest x-ray showed mild infiltration in the right upper lobe concerning for pneumonia. Patient is started on antibiotics with Rocephin and doxycycline. Patient currently lives alone at home, she had a fall 2 weeks ago, she has generalized weakness. 10/24.  Completed antibiotics, pending nursing placement.  10/25.  Patient felt dizzy.  Patient was not orthostatic.  MRI of the brain without contrast was negative.  Scopolamine patch was discontinued.  10/26.  Patient feeling better.  Receive insurance authorization for rehab.  Recommend checking bmp in one week Assessment and Plan: * Weakness Cut back beta-blocker to 25 mg daily.  Physical therapy recommending rehab.  MRI of the brain was negative.  Scopolamine patch discontinued.  CAP (community acquired pneumonia) Completed antibiotics.  Nausea vomiting and diarrhea Resolved  Hyponatremia Sodium 135 today.  Currently on salt  tablets.  Atrial fibrillation, chronic (HCC) Continue amiodarone, Eliquis and Toprol  Chronic diastolic CHF (congestive heart failure) (HCC) Holding water pills currently.  Can restart Lasix 20 mg daily as outpatient.  Continue to hold metolazone.  Hypertension Patient is not orthostatic.  Currently on Norvasc and Toprol.  Hypokalemia Replaced  Abnormal LFTs ALT slightly elevated at 47 and AST now in the normal range at 29.  Depression Restarted Lexapro  Thrush Diflucan for 6 more days.         Consultants: None Procedures performed: None Disposition: Rehabilitation facility Diet recommendation:  Cardiac diet DISCHARGE MEDICATION: Allergies as of 07/24/2022       Reactions   Amoxicillin-pot Clavulanate Nausea Only   Cefprozil Nausea Only   Clarithromycin Nausea Only   Etodolac Diarrhea   Prednisone    Other reaction(s): Other (See Comments) Hyperglycemia   Pseudoephedrine    Other reaction(s): Unknown   Penicillins Rash   Sulfa Antibiotics Rash        Medication List     STOP taking these medications    metolazone 2.5 MG tablet Commonly known as: ZAROXOLYN   metoprolol tartrate 50 MG tablet Commonly known as: LOPRESSOR       TAKE these medications    acetaminophen 325 MG tablet Commonly known as: TYLENOL Take 1 tablet (325 mg total) by mouth every 6 (six) hours as needed for mild pain or fever.   amiodarone 100 MG tablet Commonly known as: PACERONE Take 1 tablet (100 mg total) by mouth daily. Start taking on: July 25, 2022 What changed:  medication strength how much to take   amLODipine 10 MG tablet Commonly known as:  NORVASC Take 1 tablet (10 mg total) by mouth daily. Start taking on: July 25, 2022   apixaban 2.5 MG Tabs tablet Commonly known as: ELIQUIS Take 2.5 mg by mouth daily.   Calcium Carbonate-Vitamin D 600-200 MG-UNIT Tabs Take 1 tablet by mouth 2 (two) times daily.   carboxymethylcellul-glycerin 0.5-0.9 %  ophthalmic solution Commonly known as: REFRESH OPTIVE Place 1 drop into both eyes 4 (four) times daily as needed for dry eyes.   cetirizine 10 MG tablet Commonly known as: ZYRTEC Take 10 mg by mouth at bedtime.   escitalopram 10 MG tablet Commonly known as: LEXAPRO Take 1 tablet (10 mg total) by mouth daily. What changed:  medication strength how much to take when to take this   fluconazole 100 MG tablet Commonly known as: DIFLUCAN Take 1 tablet (100 mg total) by mouth daily for 6 days.   fluticasone-salmeterol 100-50 MCG/ACT Aepb Commonly known as: ADVAIR Inhale 1 puff into the lungs 2 (two) times daily.   furosemide 20 MG tablet Commonly known as: LASIX TAKE 1 TABLET BY MOUTH DAILY   magnesium oxide 400 (240 Mg) MG tablet Commonly known as: MAG-OX Take 1 tablet (400 mg total) by mouth 2 (two) times daily.   metoprolol succinate 25 MG 24 hr tablet Commonly known as: TOPROL-XL Take 1 tablet (25 mg total) by mouth daily. Start taking on: July 25, 2022   ondansetron 4 MG disintegrating tablet Commonly known as: ZOFRAN-ODT Take 4 mg by mouth every 8 (eight) hours as needed for nausea/vomiting.   potassium chloride SA 20 MEQ tablet Commonly known as: KLOR-CON M Take 1 tablet (20 mEq total) by mouth daily. What changed: when to take this   senna-docusate 8.6-50 MG tablet Commonly known as: Senokot-S Take 1 tablet by mouth 2 (two) times daily.   sodium chloride 1 g tablet Take 1 tablet (1 g total) by mouth 2 (two) times daily with a meal.        Contact information for after-discharge care     [facility] Preferred SNF .   Service: Skilled Nursing Contact information: [place] [phone]                    Discharge Exam: Filed Weights   07/21/22 0500 07/22/22 0500 07/23/22 0500  Weight: 67 kg 67.2 kg 67.5  kg   Physical Exam HENT:     Head: Normocephalic.     Mouth/Throat:     Pharynx: No oropharyngeal exudate.  Eyes:     General: Lids are normal.     Conjunctiva/sclera: Conjunctivae normal.  Cardiovascular:     Rate and Rhythm: Normal rate and regular rhythm.     Heart sounds: Normal heart sounds, S1 normal and S2 normal.  Pulmonary:     Breath sounds: No decreased breath sounds, wheezing, rhonchi or rales.  Abdominal:     Palpations: Abdomen is soft.     Tenderness: There is no abdominal tenderness.  Musculoskeletal:     Right lower leg: No swelling.     Left lower leg: No swelling.  Skin:    General: Skin is warm.     Findings: No rash.  Neurological:     Mental Status: She is alert and oriented to person, place, and time.      Condition at discharge: stable  The results of significant diagnostics from this hospitalization (including  imaging, microbiology, ancillary and laboratory) are listed below for reference.   Imaging Studies: MR BRAIN WO CONTRAST  Result Date: 07/23/2022 CLINICAL DATA:  Dizziness EXAM: MRI HEAD WITHOUT CONTRAST TECHNIQUE: Multiplanar, multiecho pulse sequences of the brain and surrounding structures were obtained without intravenous contrast. COMPARISON:  Prior MRI, correlation is made with CT head 07/17/2022 FINDINGS: Brain: No restricted diffusion to suggest acute or subacute infarct. No acute hemorrhage, mass, mass effect, or midline shift. No hydrocephalus or extra-axial collection. Punctate foci of hemosiderin deposition in the pons and posterior right temporal periventricular white matter, likely sequelae of prior microhemorrhages. Scattered T2 hyperintense signal in the periventricular white matter, likely the sequela of mild-to-moderate chronic small vessel ischemic disease. Dilated perivascular spaces in the bilateral basal ganglia. Vascular: Normal arterial flow voids. Skull and upper cervical spine: Normal marrow signal. Sinuses/Orbits: Mucosal  thickening in the left frontal sinus and left-greater-than-right ethmoid air cells. Status post bilateral lens replacements. Other: Trace fluid in left mastoid air cells. IMPRESSION: No acute intracranial process. No evidence of acute or subacute infarct. Electronically Signed   By: Merilyn Baba M.D.   On: 07/23/2022 21:49   DG Abd 1 View  Result Date: 07/20/2022 CLINICAL DATA:  19417 Abdominal distention 40814 EXAM: ABDOMEN - 1 VIEW COMPARISON:  Ultrasound abdomen 07/17/2022, CT abdomen pelvis 03/13/2018 FINDINGS: Multiple tiny calcifications within the right upper quadrant consistent with gallstones. The bowel gas pattern is normal. No radio-opaque calculi or other significant radiographic abnormality are seen. Degenerative changes of bilateral hips. Degenerative changes of the lumbar spine with several levels demonstrating lumbar vertebral body height loss. IMPRESSION: 1. Nonobstructive bowel gas pattern. 2. Cholelithiasis. Electronically Signed   By: Iven Finn M.D.   On: 07/20/2022 19:07   CT HEAD WO CONTRAST (5MM)  Result Date: 07/17/2022 CLINICAL DATA:  Nausea and poor appetite. EXAM: CT HEAD WITHOUT CONTRAST TECHNIQUE: Contiguous axial images were obtained from the base of the skull through the vertex without intravenous contrast. RADIATION DOSE REDUCTION: This exam was performed according to the departmental dose-optimization program which includes automated exposure control, adjustment of the mA and/or kV according to patient size and/or use of iterative reconstruction technique. COMPARISON:  CT head without and with contrast 03/10/2013. FINDINGS: Brain: There is mild-to-moderate cerebral atrophy and atrophic ventriculomegaly, moderately developed small-vessel disease in the cerebral white matter, findings slightly progressed from 2014. There is minimal cerebellar atrophy which is unchanged. No new asymmetry is seen concerning for an acute cortical based infarct, hemorrhage, mass or midline  shift. Basal cisterns are clear. Vascular: There are patchy calcifications of the carotid siphons but no hyperdense central vessels. Skull: Negative for fractures or focal lesions.  Mild osteopenia. Sinuses/Orbits: There are old lens extractions, otherwise unremarkable orbits and orbital contents. There is patchy membrane thickening in the ethmoid air cells, mild membrane disease with mild fluid in the right sphenoid air cell. Other visualized sinuses, bilateral mastoid air cells and middle ears are clear. Other: Dystrophic benign dural calcifications are scattered along the falx, as before. IMPRESSION: Chronic changes.  No acute intracranial CT findings. Ethmoid and right sphenoid sinus disease, including with fluid in the right sphenoid sinus. Electronically Signed   By: Telford Nab M.D.   On: 07/17/2022 20:06   US Abdomen Limited RUQ (LIVER/GB)  Result Date: 07/17/2022 CLINICAL DATA:  One day of right upper quadrant abdominal pain. EXAM: ULTRASOUND ABDOMEN LIMITED RIGHT UPPER QUADRANT COMPARISON:  CT abdomen and pelvis March 13, 2018 FINDINGS: Gallbladder: Cholelithiasis in a nondistended gallbladder without  gallbladder wall thickening. No sonographic Murphy sign noted by sonographer. Common bile duct: Diameter: 3 mm Liver: No focal lesion identified. Coarsened hepatic echotexture with nodular hepatic contour. Portal vein is patent on color Doppler imaging with normal direction of blood flow towards the liver. Other: None. IMPRESSION: 1. Cholelithiasis without sonographic evidence of acute cholecystitis. 2. Hepatic morphologic changes suggestive of cirrhosis. Electronically Signed   By: Dahlia Bailiff M.D.   On: 07/17/2022 17:19   DG Chest 2 View  Result Date: 07/17/2022 CLINICAL DATA:  Cough EXAM: CHEST - 2 VIEW COMPARISON:  08/28/2021 FINDINGS: Stable cardiomediastinal contours. Linear areas of bibasilar scarring or atelectasis. Small subtle opacity in the right upper lobe. No pleural effusion or  pneumothorax. IMPRESSION: Small subtle opacity in the right upper lobe, which may reflect developing pneumonia. Electronically Signed   By: Davina Poke D.O.   On: 07/17/2022 13:43    Microbiology: Results for orders placed or performed during the hospital encounter of 07/17/22  Blood Culture (routine x 2)     Status: None   Collection Time: 07/17/22  4:34 PM   Specimen: Left Antecubital; Blood  Result Value Ref Range Status   Specimen Description LEFT ANTECUBITAL  Final   Special Requests   Final    BOTTLES DRAWN AEROBIC AND ANAEROBIC Blood Culture adequate volume   Culture   Final    NO GROWTH 5 DAYS Performed at [facility], [address] [place], [place] [address]    Report Status 07/22/2022 FINAL  Final  Blood Culture (routine x 2)     Status: None   Collection Time: 07/17/22  4:34 PM   Specimen: BLOOD RIGHT FOREARM  Result Value Ref Range Status   Specimen Description BLOOD RIGHT FOREARM  Final   Special Requests   Final    BOTTLES DRAWN AEROBIC AND ANAEROBIC Blood Culture results may not be optimal due to an inadequate volume of blood received in culture bottles   Culture   Final    NO GROWTH 5 DAYS Performed at [facility], [address] [place], [place] [address]    Report Status 07/22/2022 FINAL  Final  SARS Coronavirus 2 by RT PCR (hospital order, performed in [facility] hospital lab) *cepheid single result test* Anterior Nasal Swab     Status: None   Collection Time: 07/17/22  6:16 PM   Specimen: Anterior Nasal Swab  Result Value Ref Range Status   SARS Coronavirus 2 by RT PCR NEGATIVE NEGATIVE Final    Comment: (NOTE) SARS-CoV-2 target nucleic acids are NOT DETECTED.  The SARS-CoV-2 RNA is generally detectable in upper and lower respiratory specimens during the acute phase of infection. The lowest concentration of SARS-CoV-2 viral copies this assay can detect is 250 copies / mL. A negative result does not preclude SARS-CoV-2  infection and should not be used as the sole basis for treatment or other patient management decisions.  A negative result may occur with improper specimen collection / handling, submission of specimen other than nasopharyngeal swab, presence of viral mutation(s) within the areas targeted by this assay, and inadequate number of viral copies (<250 copies / mL). A negative result must be combined with clinical observations, patient history, and epidemiological information.  Fact Sheet for Patients:   https://www.patel.info/  Fact Sheet for Healthcare Providers: https://hall.com/  This test is not yet approved or  cleared by the [place] FDA and has been authorized for detection and/or diagnosis of SARS-CoV-2 by FDA under an Emergency Use  Authorization (EUA).  This EUA will remain in effect (meaning this test can be used) for the duration of the COVID-19 declaration under Section 564(b)(1) of the Act, 21 U.S.C. section 360bbb-3(b)(1), unless the authorization is terminated or revoked sooner.  Performed at [facility], [address] [place], [place] [address]     Labs: CBC: Recent Labs  Lab 07/17/22 1257 07/18/22 0455  WBC 9.5 8.7  NEUTROABS 7.8*  --   HGB 13.5 11.6*  HCT 39.1 34.8*  MCV 88.5 91.1  PLT 201 413   Basic Metabolic Panel: Recent Labs  Lab 07/17/22 1257 07/17/22 1750 07/17/22 1816 07/18/22 0455 07/19/22 0554 07/20/22 0538 07/21/22 0657 07/22/22 0522 07/24/22 0534  NA 118*  --    < > 125* 128* 133* 134* 133* 135  K 3.4*  --    < > 3.8 3.7  --  2.8* 4.6 4.2  CL 79*  --    < > 91* 97*  --  100 103 104  CO2 29  --    < > 28 26  --  '26 26 26  '$ GLUCOSE 106*  --    < > 88 93  --  97 90 89  BUN 16  --    < > 10 12  --  '14 16 12  '$ CREATININE 0.88  --    < > 0.72 0.99  --  0.83 0.89 0.69  CALCIUM 9.1  --    < > 8.2* 8.4*  --  8.7* 8.7* 8.8*  MG 1.9  --   --   --  1.9  --  1.9 1.7  --   PHOS  --  3.1   --   --   --   --  3.8  --   --    < > = values in this interval not displayed.   Liver Function Tests: Recent Labs  Lab 07/17/22 1257 07/24/22 0534  AST 63* 29  ALT 81* 47*  ALKPHOS 102 74  BILITOT 1.0 0.4  PROT 6.5 5.1*  ALBUMIN 3.5 2.7*   CBG: No results for input(s): "GLUCAP" in the last 168 hours.  Discharge time spent: greater than 30 minutes.  Signed: Loletha Grayer, MD [facility] 07/24/2022

## 2022-07-24 NOTE — Progress Notes (Signed)
Report attempted with [facility] [phone]. No answer. Left contact information on AVS if facility has any questions. EMS to transport patient. Patient currently second on list.   Fuller Mandril, RN

## 2022-07-24 NOTE — TOC Transition Note (Signed)
Transition of Care [facility]) - CM/SW Discharge Note   Patient Details  Name: [name] MRN: [id number] Date of Birth: 1926/11/30  Transition of [facility] CM/SW Contact:  Beverly Sessions, RN Phone Number: 07/24/2022, 12:14 PM   Clinical Narrative:     Patient will DC to: [facility] Anticipated DC date:. 07/24/22 Transport [id number] states she will request a friend to pick her up  Per MD patient ready for DC to . RN, patient, , and facility notified of DC. Discharge Summary sent to facility. RN given number for report.   TOC signing off.  Isaias Cowman [facility] [phone]     Barriers to Discharge: Continued Medical Work up   Patient Goals and CMS Choice Patient states their goals for this hospitalization and ongoing recovery are:: to go to snf      Discharge Placement                       Discharge Plan and Services In-house Referral: Clinical Social Work   Post Acute Care Choice: [place]                               Social Determinants of Health (SDOH) Interventions     Readmission Risk Interventions     No data to display

## 2022-07-24 NOTE — Assessment & Plan Note (Signed)
Restarted Lexapro

## 2022-07-24 NOTE — Progress Notes (Signed)
Physical Therapy Treatment Patient Details Name: [name] MRN: [id number] DOB: [birth year] Today's Date: 07/24/2022   History of Present Illness Per MD: [name] is a 88 y.o. female with past medical history of fibrillation on Eliquis, hypertension, CHF, CAD, who presents to the emergency department with productive cough, nausea and vomiting ongoing for approximately 1 month.  She was seen by her outside provider at the end of September and was prescribed doxycycline and prednisone for sinus congestion and presumed pneumonia at that time.  She was then seen by her doctor about 1 week ago for follow-up and was ordered for chest x-ray and documentation they are suspicious for viral infection.    PT Comments    Pt received in bed with amily by her side agreeable to PT interventions. Pt performed Supine to sit with HOB elevated with Sup of 1, Sat on the EOB with good balance. Reported of dizziness. STS with Min assist of 1 and stood with heavy reliance on RW with post tilt unable to  maintain standing balance which improved with time to CGA of 1. Pt step pivot transfer to chair with Min assist to mod assit of 1 with improving dizziness. Pt ambulated 5 ft and 25 ft with FWW with Min assist of 1 and F/B chair due to fair balance. Pt's Ant tib weak on BLW R>L contributing to high fall risk. Pt verbalize that she is happy today because she is feeling better but overall feels sad. MD made aware o fit. Pt will benefit from SNF after acute care.   Recommendations for follow up therapy are one component of a multi-disciplinary discharge planning process, led by the attending physician.  Recommendations may be updated based on patient status, additional functional criteria and insurance authorization.  Follow Up Recommendations  Skilled nursing-short term rehab (<3 hours/day) Can patient physically be transported by private vehicle: No   Assistance Recommended at Discharge Intermittent Supervision/Assistance   Patient can return home with the following A lot of help with walking and/or transfers;A lot of help with bathing/dressing/bathroom;Assistance with cooking/housework;Assist for transportation;Help with stairs or ramp for entrance   Equipment Recommendations  None recommended by PT    Recommendations for Other Services       Precautions / Restrictions Precautions Precautions: Fall Precaution Comments: Pt has hx of one recent fall 2 weeks ago; denies other falls in the past few months. Restrictions Weight Bearing Restrictions: No     Mobility  Bed Mobility Overal bed mobility: Needs Assistance Bed Mobility: Supine to Sit     Supine to sit: HOB elevated, Min assist     General bed mobility comments: slow    Transfers Overall transfer level: Needs assistance Equipment used: Rolling walker (2 wheels) Transfers: Sit to/from Stand, Bed to chair/wheelchair/BSC Sit to Stand: Min assist   Step pivot transfers: Mod assist       General transfer comment: Pt dizzy and weak which improved with time.    Ambulation/Gait Ambulation/Gait assistance: Min assist Gait Distance (Feet): 30 Feet Assistive device: Rolling walker (2 wheels) Gait Pattern/deviations: Step-to pattern (dec DF) Gait velocity: dec     General Gait Details: slow and unsteady   Stairs             Wheelchair Mobility    Modified Rankin (Stroke Patients Only)       Balance Overall balance assessment: Needs assistance Sitting-balance support: No upper extremity supported, Feet unsupported Sitting balance-Leahy Scale: Good     Standing balance support: Reliant on  assistive device for balance, Bilateral upper extremity supported Standing balance-Leahy Scale: Fair Standing balance comment: post lean corrected by PT. Weak Ant Tibs                            Cognition Arousal/Alertness: Awake/alert Behavior During Therapy: Flat affect, WFL for tasks assessed/performed Overall  Cognitive Status: Within Functional Limits for tasks assessed                                 General Comments: Pt expressed feelings of sadness        Exercises Total Joint Exercises Ankle Circles/Pumps: AROM, 10 reps, Seated Quad Sets: AROM, Strengthening, Both, 10 reps, Supine Gluteal Sets: Strengthening, 10 reps, Seated    General Comments        Pertinent Vitals/Pain Pain Assessment Pain Assessment: No/denies pain    Home Living                          Prior Function            PT Goals (current goals can now be found in the care plan section) Acute Rehab PT Goals Patient Stated Goal: to go back home. PT Goal Formulation: With patient Time For Goal Achievement: 08/01/22 Potential to Achieve Goals: Good Progress towards PT goals: Progressing toward goals    Frequency    Min 1X/week      PT Plan Current plan remains appropriate    Co-evaluation              AM-PAC PT "6 Clicks" Mobility   Outcome Measure  Help needed turning from your back to your side while in a flat bed without using bedrails?: None Help needed moving from lying on your back to sitting on the side of a flat bed without using bedrails?: A Little Help needed moving to and from a bed to a chair (including a wheelchair)?: A Lot Help needed standing up from a chair using your arms (e.g., wheelchair or bedside chair)?: A Lot Help needed to walk in hospital room?: A Little Help needed climbing 3-5 steps with a railing? : Total 6 Click Score: 15    End of Session Equipment Utilized During Treatment: Gait belt Activity Tolerance: Patient tolerated treatment well Patient left: in chair;with chair alarm set;with nursing/sitter in room Nurse Communication: Mobility status PT Visit Diagnosis: Unsteadiness on feet (R26.81);Other abnormalities of gait and mobility (R26.89);Muscle weakness (generalized) (M62.81);History of falling (Z91.81);Difficulty in walking, not  elsewhere classified (R26.2);Pain     Time: 2449-7530 PT Time Calculation (min) (ACUTE ONLY): 23 min  Charges:  $Gait Training: 8-22 mins $Therapeutic Activity: 8-22 mins                     Rollin Kotowski PT DPT 1:19 PM,07/24/22

## 2022-07-24 NOTE — Plan of Care (Signed)
  Problem: Respiratory: Goal: Ability to maintain adequate ventilation will improve Outcome: Progressing   Problem: Health Behavior/Discharge Planning: Goal: Ability to manage health-related needs will improve Outcome: Progressing   Problem: Activity: Goal: Risk for activity intolerance will decrease Outcome: Progressing   Problem: Nutrition: Goal: Adequate nutrition will be maintained Outcome: Progressing   Problem: Safety: Goal: Ability to remain free from injury will improve Outcome: Progressing

## 2022-07-25 DIAGNOSIS — F3289 Other specified depressive episodes: Secondary | ICD-10-CM | POA: Diagnosis not present

## 2022-07-25 DIAGNOSIS — M6281 Muscle weakness (generalized): Secondary | ICD-10-CM | POA: Diagnosis not present

## 2022-07-25 DIAGNOSIS — J189 Pneumonia, unspecified organism: Secondary | ICD-10-CM | POA: Diagnosis not present

## 2022-07-25 DIAGNOSIS — E876 Hypokalemia: Secondary | ICD-10-CM | POA: Diagnosis not present

## 2022-07-25 DIAGNOSIS — I10 Essential (primary) hypertension: Secondary | ICD-10-CM | POA: Diagnosis not present

## 2022-07-25 DIAGNOSIS — B370 Candidal stomatitis: Secondary | ICD-10-CM | POA: Diagnosis not present

## 2022-07-25 DIAGNOSIS — I5042 Chronic combined systolic (congestive) and diastolic (congestive) heart failure: Secondary | ICD-10-CM | POA: Diagnosis not present

## 2022-07-25 DIAGNOSIS — I480 Paroxysmal atrial fibrillation: Secondary | ICD-10-CM | POA: Diagnosis not present

## 2022-07-25 DIAGNOSIS — E871 Hypo-osmolality and hyponatremia: Secondary | ICD-10-CM | POA: Diagnosis not present

## 2022-07-28 DIAGNOSIS — I10 Essential (primary) hypertension: Secondary | ICD-10-CM | POA: Diagnosis not present

## 2022-07-28 DIAGNOSIS — B370 Candidal stomatitis: Secondary | ICD-10-CM | POA: Diagnosis not present

## 2022-07-28 DIAGNOSIS — M6281 Muscle weakness (generalized): Secondary | ICD-10-CM | POA: Diagnosis not present

## 2022-07-28 DIAGNOSIS — J189 Pneumonia, unspecified organism: Secondary | ICD-10-CM | POA: Diagnosis not present

## 2022-07-28 DIAGNOSIS — I5042 Chronic combined systolic (congestive) and diastolic (congestive) heart failure: Secondary | ICD-10-CM | POA: Diagnosis not present

## 2022-07-28 DIAGNOSIS — I480 Paroxysmal atrial fibrillation: Secondary | ICD-10-CM | POA: Diagnosis not present

## 2022-07-28 DIAGNOSIS — F3289 Other specified depressive episodes: Secondary | ICD-10-CM | POA: Diagnosis not present

## 2022-07-28 DIAGNOSIS — E871 Hypo-osmolality and hyponatremia: Secondary | ICD-10-CM | POA: Diagnosis not present

## 2022-07-28 DIAGNOSIS — E876 Hypokalemia: Secondary | ICD-10-CM | POA: Diagnosis not present

## 2022-07-29 DIAGNOSIS — E871 Hypo-osmolality and hyponatremia: Secondary | ICD-10-CM | POA: Diagnosis not present

## 2022-07-29 DIAGNOSIS — I480 Paroxysmal atrial fibrillation: Secondary | ICD-10-CM | POA: Diagnosis not present

## 2022-07-29 DIAGNOSIS — I10 Essential (primary) hypertension: Secondary | ICD-10-CM | POA: Diagnosis not present

## 2022-07-29 DIAGNOSIS — B370 Candidal stomatitis: Secondary | ICD-10-CM | POA: Diagnosis not present

## 2022-07-29 DIAGNOSIS — I5042 Chronic combined systolic (congestive) and diastolic (congestive) heart failure: Secondary | ICD-10-CM | POA: Diagnosis not present

## 2022-07-29 DIAGNOSIS — E876 Hypokalemia: Secondary | ICD-10-CM | POA: Diagnosis not present

## 2022-07-29 DIAGNOSIS — F3289 Other specified depressive episodes: Secondary | ICD-10-CM | POA: Diagnosis not present

## 2022-07-29 DIAGNOSIS — M6281 Muscle weakness (generalized): Secondary | ICD-10-CM | POA: Diagnosis not present

## 2022-07-29 DIAGNOSIS — J158 Pneumonia due to other specified bacteria: Secondary | ICD-10-CM | POA: Diagnosis not present

## 2022-08-01 DIAGNOSIS — F3289 Other specified depressive episodes: Secondary | ICD-10-CM | POA: Diagnosis not present

## 2022-08-01 DIAGNOSIS — M6281 Muscle weakness (generalized): Secondary | ICD-10-CM | POA: Diagnosis not present

## 2022-08-01 DIAGNOSIS — B370 Candidal stomatitis: Secondary | ICD-10-CM | POA: Diagnosis not present

## 2022-08-01 DIAGNOSIS — I480 Paroxysmal atrial fibrillation: Secondary | ICD-10-CM | POA: Diagnosis not present

## 2022-08-01 DIAGNOSIS — I5042 Chronic combined systolic (congestive) and diastolic (congestive) heart failure: Secondary | ICD-10-CM | POA: Diagnosis not present

## 2022-08-01 DIAGNOSIS — I10 Essential (primary) hypertension: Secondary | ICD-10-CM | POA: Diagnosis not present

## 2022-08-01 DIAGNOSIS — E876 Hypokalemia: Secondary | ICD-10-CM | POA: Diagnosis not present

## 2022-08-01 DIAGNOSIS — J158 Pneumonia due to other specified bacteria: Secondary | ICD-10-CM | POA: Diagnosis not present

## 2022-08-01 DIAGNOSIS — E871 Hypo-osmolality and hyponatremia: Secondary | ICD-10-CM | POA: Diagnosis not present

## 2022-08-04 DIAGNOSIS — I480 Paroxysmal atrial fibrillation: Secondary | ICD-10-CM | POA: Diagnosis not present

## 2022-08-04 DIAGNOSIS — F3289 Other specified depressive episodes: Secondary | ICD-10-CM | POA: Diagnosis not present

## 2022-08-04 DIAGNOSIS — E871 Hypo-osmolality and hyponatremia: Secondary | ICD-10-CM | POA: Diagnosis not present

## 2022-08-04 DIAGNOSIS — I10 Essential (primary) hypertension: Secondary | ICD-10-CM | POA: Diagnosis not present

## 2022-08-04 DIAGNOSIS — B370 Candidal stomatitis: Secondary | ICD-10-CM | POA: Diagnosis not present

## 2022-08-04 DIAGNOSIS — E876 Hypokalemia: Secondary | ICD-10-CM | POA: Diagnosis not present

## 2022-08-04 DIAGNOSIS — I5042 Chronic combined systolic (congestive) and diastolic (congestive) heart failure: Secondary | ICD-10-CM | POA: Diagnosis not present

## 2022-08-04 DIAGNOSIS — J158 Pneumonia due to other specified bacteria: Secondary | ICD-10-CM | POA: Diagnosis not present

## 2022-08-04 DIAGNOSIS — M6281 Muscle weakness (generalized): Secondary | ICD-10-CM | POA: Diagnosis not present

## 2022-08-12 DIAGNOSIS — I5020 Unspecified systolic (congestive) heart failure: Secondary | ICD-10-CM | POA: Diagnosis not present

## 2022-08-12 DIAGNOSIS — J189 Pneumonia, unspecified organism: Secondary | ICD-10-CM | POA: Diagnosis not present

## 2022-08-12 DIAGNOSIS — Z23 Encounter for immunization: Secondary | ICD-10-CM | POA: Diagnosis not present

## 2022-08-12 DIAGNOSIS — E440 Moderate protein-calorie malnutrition: Secondary | ICD-10-CM | POA: Diagnosis not present

## 2022-08-12 DIAGNOSIS — I4891 Unspecified atrial fibrillation: Secondary | ICD-10-CM | POA: Diagnosis not present

## 2022-08-12 DIAGNOSIS — I110 Hypertensive heart disease with heart failure: Secondary | ICD-10-CM | POA: Diagnosis not present

## 2022-08-12 DIAGNOSIS — J9811 Atelectasis: Secondary | ICD-10-CM | POA: Diagnosis not present

## 2022-08-28 DIAGNOSIS — I2510 Atherosclerotic heart disease of native coronary artery without angina pectoris: Secondary | ICD-10-CM | POA: Diagnosis not present

## 2022-08-28 DIAGNOSIS — I480 Paroxysmal atrial fibrillation: Secondary | ICD-10-CM | POA: Diagnosis not present

## 2022-08-28 DIAGNOSIS — I110 Hypertensive heart disease with heart failure: Secondary | ICD-10-CM | POA: Diagnosis not present

## 2022-08-28 DIAGNOSIS — J189 Pneumonia, unspecified organism: Secondary | ICD-10-CM | POA: Diagnosis not present

## 2022-08-28 DIAGNOSIS — I5042 Chronic combined systolic (congestive) and diastolic (congestive) heart failure: Secondary | ICD-10-CM | POA: Diagnosis not present

## 2022-08-28 DIAGNOSIS — E7800 Pure hypercholesterolemia, unspecified: Secondary | ICD-10-CM | POA: Diagnosis not present

## 2022-08-28 DIAGNOSIS — J440 Chronic obstructive pulmonary disease with acute lower respiratory infection: Secondary | ICD-10-CM | POA: Diagnosis not present

## 2022-09-10 DIAGNOSIS — I110 Hypertensive heart disease with heart failure: Secondary | ICD-10-CM | POA: Diagnosis not present

## 2022-09-10 DIAGNOSIS — I5022 Chronic systolic (congestive) heart failure: Secondary | ICD-10-CM | POA: Diagnosis not present

## 2022-10-16 DIAGNOSIS — I252 Old myocardial infarction: Secondary | ICD-10-CM | POA: Diagnosis not present

## 2022-10-16 DIAGNOSIS — I2510 Atherosclerotic heart disease of native coronary artery without angina pectoris: Secondary | ICD-10-CM | POA: Diagnosis not present

## 2022-10-16 DIAGNOSIS — I480 Paroxysmal atrial fibrillation: Secondary | ICD-10-CM | POA: Diagnosis not present

## 2022-10-16 DIAGNOSIS — E7800 Pure hypercholesterolemia, unspecified: Secondary | ICD-10-CM | POA: Diagnosis not present

## 2022-10-16 DIAGNOSIS — I110 Hypertensive heart disease with heart failure: Secondary | ICD-10-CM | POA: Diagnosis not present

## 2022-10-16 DIAGNOSIS — I5042 Chronic combined systolic (congestive) and diastolic (congestive) heart failure: Secondary | ICD-10-CM | POA: Diagnosis not present

## 2022-10-16 DIAGNOSIS — J449 Chronic obstructive pulmonary disease, unspecified: Secondary | ICD-10-CM | POA: Diagnosis not present

## 2022-10-27 DIAGNOSIS — J449 Chronic obstructive pulmonary disease, unspecified: Secondary | ICD-10-CM | POA: Diagnosis not present

## 2022-10-27 DIAGNOSIS — E871 Hypo-osmolality and hyponatremia: Secondary | ICD-10-CM | POA: Diagnosis not present

## 2022-10-27 DIAGNOSIS — I2510 Atherosclerotic heart disease of native coronary artery without angina pectoris: Secondary | ICD-10-CM | POA: Diagnosis not present

## 2022-10-27 DIAGNOSIS — I482 Chronic atrial fibrillation, unspecified: Secondary | ICD-10-CM | POA: Diagnosis not present

## 2022-10-27 DIAGNOSIS — Z1331 Encounter for screening for depression: Secondary | ICD-10-CM | POA: Diagnosis not present

## 2022-10-27 DIAGNOSIS — I509 Heart failure, unspecified: Secondary | ICD-10-CM | POA: Diagnosis not present

## 2022-10-27 DIAGNOSIS — Z0000 Encounter for general adult medical examination without abnormal findings: Secondary | ICD-10-CM | POA: Diagnosis not present

## 2022-11-10 DIAGNOSIS — J189 Pneumonia, unspecified organism: Secondary | ICD-10-CM | POA: Diagnosis not present

## 2022-11-10 DIAGNOSIS — I2510 Atherosclerotic heart disease of native coronary artery without angina pectoris: Secondary | ICD-10-CM | POA: Diagnosis not present

## 2022-11-27 DIAGNOSIS — I2510 Atherosclerotic heart disease of native coronary artery without angina pectoris: Secondary | ICD-10-CM | POA: Diagnosis not present

## 2022-11-27 DIAGNOSIS — J189 Pneumonia, unspecified organism: Secondary | ICD-10-CM | POA: Diagnosis not present

## 2022-11-27 DIAGNOSIS — I4891 Unspecified atrial fibrillation: Secondary | ICD-10-CM | POA: Diagnosis not present

## 2022-12-03 DIAGNOSIS — D485 Neoplasm of uncertain behavior of skin: Secondary | ICD-10-CM | POA: Diagnosis not present

## 2022-12-03 DIAGNOSIS — C44311 Basal cell carcinoma of skin of nose: Secondary | ICD-10-CM | POA: Diagnosis not present

## 2023-02-27 DIAGNOSIS — Z961 Presence of intraocular lens: Secondary | ICD-10-CM | POA: Diagnosis not present

## 2023-02-27 DIAGNOSIS — H02133 Senile ectropion of right eye, unspecified eyelid: Secondary | ICD-10-CM | POA: Diagnosis not present

## 2023-02-27 DIAGNOSIS — H26491 Other secondary cataract, right eye: Secondary | ICD-10-CM | POA: Diagnosis not present

## 2023-02-27 DIAGNOSIS — H353131 Nonexudative age-related macular degeneration, bilateral, early dry stage: Secondary | ICD-10-CM | POA: Diagnosis not present

## 2023-04-27 DIAGNOSIS — I5023 Acute on chronic systolic (congestive) heart failure: Secondary | ICD-10-CM | POA: Diagnosis not present

## 2023-04-27 DIAGNOSIS — I25119 Atherosclerotic heart disease of native coronary artery with unspecified angina pectoris: Secondary | ICD-10-CM | POA: Diagnosis not present

## 2023-10-30 DIAGNOSIS — I110 Hypertensive heart disease with heart failure: Secondary | ICD-10-CM | POA: Diagnosis not present

## 2023-10-30 DIAGNOSIS — J45909 Unspecified asthma, uncomplicated: Secondary | ICD-10-CM | POA: Diagnosis not present

## 2023-10-30 DIAGNOSIS — Z1331 Encounter for screening for depression: Secondary | ICD-10-CM | POA: Diagnosis not present

## 2023-10-30 DIAGNOSIS — I2510 Atherosclerotic heart disease of native coronary artery without angina pectoris: Secondary | ICD-10-CM | POA: Diagnosis not present

## 2023-10-30 DIAGNOSIS — R739 Hyperglycemia, unspecified: Secondary | ICD-10-CM | POA: Diagnosis not present

## 2023-10-30 DIAGNOSIS — I509 Heart failure, unspecified: Secondary | ICD-10-CM | POA: Diagnosis not present

## 2023-10-30 DIAGNOSIS — Z0000 Encounter for general adult medical examination without abnormal findings: Secondary | ICD-10-CM | POA: Diagnosis not present

## 2023-10-30 DIAGNOSIS — I482 Chronic atrial fibrillation, unspecified: Secondary | ICD-10-CM | POA: Diagnosis not present

## 2023-11-04 ENCOUNTER — Other Ambulatory Visit: Payer: Self-pay

## 2023-11-04 ENCOUNTER — Emergency Department: Payer: Medicare HMO

## 2023-11-04 ENCOUNTER — Observation Stay
Admission: EM | Admit: 2023-11-04 | Discharge: 2023-11-05 | Disposition: A | Discharge status: Home / Self Care | Payer: Medicare HMO | Attending: Emergency Medicine | Admitting: Emergency Medicine

## 2023-11-04 DIAGNOSIS — J189 Pneumonia, unspecified organism: Principal | ICD-10-CM | POA: Insufficient documentation

## 2023-11-04 DIAGNOSIS — Z20822 Contact with and (suspected) exposure to covid-19: Secondary | ICD-10-CM | POA: Diagnosis not present

## 2023-11-04 DIAGNOSIS — R1310 Dysphagia, unspecified: Secondary | ICD-10-CM | POA: Insufficient documentation

## 2023-11-04 DIAGNOSIS — R918 Other nonspecific abnormal finding of lung field: Secondary | ICD-10-CM | POA: Diagnosis not present

## 2023-11-04 DIAGNOSIS — I517 Cardiomegaly: Secondary | ICD-10-CM | POA: Diagnosis not present

## 2023-11-04 DIAGNOSIS — A419 Sepsis, unspecified organism: Secondary | ICD-10-CM | POA: Diagnosis not present

## 2023-11-04 DIAGNOSIS — K8080 Other cholelithiasis without obstruction: Secondary | ICD-10-CM | POA: Insufficient documentation

## 2023-11-04 DIAGNOSIS — I110 Hypertensive heart disease with heart failure: Secondary | ICD-10-CM | POA: Insufficient documentation

## 2023-11-04 DIAGNOSIS — I10 Essential (primary) hypertension: Secondary | ICD-10-CM | POA: Insufficient documentation

## 2023-11-04 DIAGNOSIS — I480 Paroxysmal atrial fibrillation: Secondary | ICD-10-CM | POA: Diagnosis present

## 2023-11-04 DIAGNOSIS — I509 Heart failure, unspecified: Secondary | ICD-10-CM | POA: Diagnosis not present

## 2023-11-04 DIAGNOSIS — I3139 Other pericardial effusion (noninflammatory): Secondary | ICD-10-CM | POA: Diagnosis not present

## 2023-11-04 DIAGNOSIS — J90 Pleural effusion, not elsewhere classified: Secondary | ICD-10-CM | POA: Diagnosis not present

## 2023-11-04 DIAGNOSIS — F32A Depression, unspecified: Secondary | ICD-10-CM | POA: Diagnosis not present

## 2023-11-04 DIAGNOSIS — R1110 Vomiting, unspecified: Secondary | ICD-10-CM | POA: Diagnosis not present

## 2023-11-04 DIAGNOSIS — K5730 Diverticulosis of large intestine without perforation or abscess without bleeding: Secondary | ICD-10-CM | POA: Diagnosis not present

## 2023-11-04 DIAGNOSIS — R059 Cough, unspecified: Secondary | ICD-10-CM | POA: Diagnosis not present

## 2023-11-04 DIAGNOSIS — K8020 Calculus of gallbladder without cholecystitis without obstruction: Secondary | ICD-10-CM

## 2023-11-04 DIAGNOSIS — I4891 Unspecified atrial fibrillation: Secondary | ICD-10-CM | POA: Diagnosis not present

## 2023-11-04 DIAGNOSIS — R062 Wheezing: Secondary | ICD-10-CM | POA: Diagnosis present

## 2023-11-04 DIAGNOSIS — R000 Tachycardia, unspecified: Secondary | ICD-10-CM | POA: Diagnosis not present

## 2023-11-04 LAB — CBC WITH DIFFERENTIAL/PLATELET
Abs Immature Granulocytes: 0.08 10*3/uL — ABNORMAL HIGH (ref 0.00–0.07)
Basophils Absolute: 0 10*3/uL (ref 0.0–0.1)
Basophils Relative: 0 %
Eosinophils Absolute: 0 10*3/uL (ref 0.0–0.5)
Eosinophils Relative: 0 %
HCT: 38.7 % (ref 36.0–46.0)
Hemoglobin: 12.5 g/dL (ref 12.0–15.0)
Immature Granulocytes: 1 %
Lymphocytes Relative: 4 %
Lymphs Abs: 0.6 10*3/uL — ABNORMAL LOW (ref 0.7–4.0)
MCH: 27.7 pg (ref 26.0–34.0)
MCHC: 32.3 g/dL (ref 30.0–36.0)
MCV: 85.6 fL (ref 80.0–100.0)
Monocytes Absolute: 0.8 10*3/uL (ref 0.1–1.0)
Monocytes Relative: 5 %
Neutro Abs: 14.7 10*3/uL — ABNORMAL HIGH (ref 1.7–7.7)
Neutrophils Relative %: 90 %
Platelets: 316 10*3/uL (ref 150–400)
RBC: 4.52 MIL/uL (ref 3.87–5.11)
RDW: 18.2 % — ABNORMAL HIGH (ref 11.5–15.5)
WBC: 16.2 10*3/uL — ABNORMAL HIGH (ref 4.0–10.5)
nRBC: 0 % (ref 0.0–0.2)

## 2023-11-04 LAB — BASIC METABOLIC PANEL
Anion gap: 10 (ref 5–15)
BUN: 33 mg/dL — ABNORMAL HIGH (ref 8–23)
CO2: 26 mmol/L (ref 22–32)
Calcium: 9 mg/dL (ref 8.9–10.3)
Chloride: 103 mmol/L (ref 98–111)
Creatinine, Ser: 0.93 mg/dL (ref 0.44–1.00)
GFR, Estimated: 57 mL/min — ABNORMAL LOW (ref >60)
Glucose, Bld: 134 mg/dL — ABNORMAL HIGH (ref 70–99)
Potassium: 3.8 mmol/L (ref 3.5–5.1)
Sodium: 139 mmol/L (ref 135–145)

## 2023-11-04 MED ORDER — ACETAMINOPHEN 500 MG PO TABS
1000.0000 mg | ORAL_TABLET | Freq: Once | ORAL | Status: AC
  Administered 2023-11-05: 1000 mg via ORAL
  Filled 2023-11-04: qty 2

## 2023-11-04 MED ORDER — VANCOMYCIN HCL IN DEXTROSE 1-5 GM/200ML-% IV SOLN
1000.0000 mg | Freq: Once | INTRAVENOUS | Status: AC
  Administered 2023-11-05: 1000 mg via INTRAVENOUS
  Filled 2023-11-04: qty 200

## 2023-11-04 MED ORDER — METRONIDAZOLE 500 MG/100ML IV SOLN
500.0000 mg | Freq: Once | INTRAVENOUS | Status: AC
  Administered 2023-11-05: 500 mg via INTRAVENOUS
  Filled 2023-11-04: qty 100

## 2023-11-04 MED ORDER — SODIUM CHLORIDE 0.9 % IV SOLN
2.0000 g | Freq: Once | INTRAVENOUS | Status: AC
  Administered 2023-11-04: 2 g via INTRAVENOUS
  Filled 2023-11-04: qty 10

## 2023-11-04 MED ORDER — LACTATED RINGERS IV BOLUS (SEPSIS)
1000.0000 mL | Freq: Once | INTRAVENOUS | Status: AC
  Administered 2023-11-04: 1000 mL via INTRAVENOUS

## 2023-11-04 NOTE — ED Provider Triage Note (Signed)
 Emergency Medicine Provider Triage Evaluation Note  [name] , a 89 y.o. female  was evaluated in triage.  Pt complains of being sent by [facility] to rule out aspiration pneumonia. Patient drinks a protein shake every night that makes her cough. She was coughing so much yesterday that she had pain. Cough is productive with yellow mucus, no blood. No fevers. She had some vomiting this morning.   Review of Systems  Positive: Coughing, pain in upper chest,  Negative: fever  Physical Exam  There were no vitals taken for this visit. Gen:   Awake, no distress   Resp:  Normal effort  MSK:   Moves extremities without difficulty  Other:    Medical Decision Making  Medically screening exam initiated at 2:51 PM.  Appropriate orders placed.  [name] was informed that the remainder of the evaluation will be completed by another provider, this initial triage assessment does not replace that evaluation, and the importance of remaining in the ED until their evaluation is complete.    [name] LABOR, PA-C 11/04/23 1455

## 2023-11-04 NOTE — Progress Notes (Signed)
 Elink monitoring for the code sepsis protocol.

## 2023-11-04 NOTE — ED Provider Notes (Signed)
 [facility] Provider Note    Event Date/Time   First MD Initiated Contact with Patient 11/04/23 2303     (approximate)   History   Cough   HPI  Yesena Reaves is a 89 y.o. female with history of atrial fibrillation, CHF, hypertension who presents to the emergency department with complaints of cough, nausea and vomiting.  Patient states that she thinks she may have choked on her protein drink yesterday.  She denies any known fevers but is very warm to touch here.  No diarrhea.  No chest or abdominal pain.   History provided by patient, daughter.    Past Medical History:  Diagnosis Date   Allergic rhinitis    Arrhythmia    atrial fibrillation   Arthritis    CHF (congestive heart failure) (HCC)    Colon polyp    Edema    Esophageal spasm    GERD (gastroesophageal reflux disease)    Hiatal hernia    Hypertension     Past Surgical History:  Procedure Laterality Date   APPENDECTOMY     CATARACT EXTRACTION     CORONARY STENT INTERVENTION N/A 08/30/2021   Procedure: CORONARY STENT INTERVENTION;  Surgeon: Florencio Cara BIRCH, MD;  Location: [facility] INVASIVE CV LAB;  Service: Cardiovascular;  Laterality: N/A;   dilatation and curatage     EYE SURGERY     HYSTEROTOMY     LEFT HEART CATH AND CORONARY ANGIOGRAPHY N/A 08/30/2021   Procedure: LEFT HEART CATH AND CORONARY ANGIOGRAPHY possible PCI and stent;  Surgeon: Florencio Cara BIRCH, MD;  Location: [facility] INVASIVE CV LAB;  Service: Cardiovascular;  Laterality: N/A;   OOPHORECTOMY     TONSILLECTOMY      MEDICATIONS:  Prior to Admission medications   Medication Sig Start Date End Date Taking? Authorizing Provider  acetaminophen  (TYLENOL ) 325 MG tablet Take 1 tablet (325 mg total) by mouth every 6 (six) hours as needed for mild pain or fever. 07/24/22   Josette Ade, MD  amiodarone  (PACERONE ) 100 MG tablet Take 1 tablet (100 mg total) by mouth daily. 07/25/22   Josette Ade, MD  amLODipine  (NORVASC ) 10  MG tablet Take 1 tablet (10 mg total) by mouth daily. 07/25/22   Josette Ade, MD  apixaban  (ELIQUIS ) 2.5 MG TABS tablet Take 2.5 mg by mouth daily.    [provider]  Calcium  Carbonate-Vitamin D  600-200 MG-UNIT TABS Take 1 tablet by mouth 2 (two) times daily.    [provider]  carboxymethylcellul-glycerin  (REFRESH OPTIVE) 0.5-0.9 % ophthalmic solution Place 1 drop into both eyes 4 (four) times daily as needed for dry eyes.    [provider]  cetirizine (ZYRTEC) 10 MG tablet Take 10 mg by mouth at bedtime.    [provider]  escitalopram  (LEXAPRO ) 10 MG tablet Take 1 tablet (10 mg total) by mouth daily. 07/24/22   Josette Ade, MD  fluticasone -salmeterol (ADVAIR) 100-50 MCG/ACT AEPB Inhale 1 puff into the lungs 2 (two) times daily.    [provider]  furosemide  (LASIX ) 20 MG tablet TAKE 1 TABLET BY MOUTH DAILY 01/07/22   [place] A, FNP  magnesium  oxide (MAG-OX) 400 (240 Mg) MG tablet Take 1 tablet (400 mg total) by mouth 2 (two) times daily. 07/24/22   Josette Ade, MD  metoprolol  succinate (TOPROL -XL) 25 MG 24 hr tablet Take 1 tablet (25 mg total) by mouth daily. 07/25/22   Josette Ade, MD  ondansetron  (ZOFRAN -ODT) 4 MG disintegrating tablet Take 4 mg by  mouth every 8 (eight) hours as needed for nausea/vomiting.    [provider]  potassium chloride  SA (KLOR-CON  M) 20 MEQ tablet Take 1 tablet (20 mEq total) by mouth daily. 07/24/22   Josette Ade, MD  senna-docusate (SENOKOT-S) 8.6-50 MG tablet Take 1 tablet by mouth 2 (two) times daily. 07/24/22   Josette Ade, MD  sodium chloride  1 g tablet Take 1 tablet (1 g total) by mouth 2 (two) times daily with a meal. 07/24/22   Josette Ade, MD    Physical Exam   Triage Vital Signs: ED Triage Vitals [11/04/23 1455]  Encounter Vitals Group     BP 134/73     Systolic BP Percentile      Diastolic BP Percentile      Pulse Rate 100     Resp 18     Temp 98.6  F (37 C)     Temp Source Oral     SpO2 93 %     Weight 148 lb (67.1 kg)     Height 5' 5 (1.651 m)     Head Circumference      Peak Flow      Pain Score 5     Pain Loc      Pain Education      Exclude from Growth Chart     Most recent vital signs: Vitals:   11/05/23 0600 11/05/23 0700  BP: (!) 107/55 (!) 123/55  Pulse: 79 74  Resp: (!) 27 (!) 27  Temp:    SpO2: 95% 95%    CONSTITUTIONAL: Alert, responds appropriately to questions.  Elderly HEAD: Normocephalic, atraumatic EYES: Conjunctivae clear, pupils appear equal, sclera nonicteric ENT: normal nose; moist mucous membranes NECK: Supple, normal ROM CARD: RRR; S1 and S2 appreciated RESP: Normal chest excursion without splinting or tachypnea; breath sounds clear and equal bilaterally; no wheezes, no rhonchi, no rales, no hypoxia or respiratory distress, speaking full sentences ABD/GI: Non-distended; soft, non-tender, no rebound, no guarding, no peritoneal signs BACK: The back appears normal EXT: Normal ROM in all joints; no deformity noted, no edema SKIN: Normal color for age and race; warm; no rash on exposed skin NEURO: Moves all extremities equally, normal speech PSYCH: The patient's mood and manner are appropriate.   ED Results / Procedures / Treatments   LABS: (all labs ordered are listed, but only abnormal results are displayed) Labs Reviewed  BASIC METABOLIC PANEL - Abnormal; Notable for the following components:      Result Value   Glucose, Bld 134 (*)    BUN 33 (*)    GFR, Estimated 57 (*)    All other components within normal limits  CBC WITH DIFFERENTIAL/PLATELET - Abnormal; Notable for the following components:   WBC 16.2 (*)    RDW 18.2 (*)    Neutro Abs 14.7 (*)    Lymphs Abs 0.6 (*)    Abs Immature Granulocytes 0.08 (*)    All other components within normal limits  PROTIME-INR - Abnormal; Notable for the following components:   Prothrombin Time 16.4 (*)    INR 1.3 (*)    All other components  within normal limits  URINALYSIS, W/ REFLEX TO CULTURE (INFECTION SUSPECTED) - Abnormal; Notable for the following components:   Color, Urine YELLOW (*)    APPearance CLEAR (*)    Specific Gravity, Urine 1.045 (*)    Hgb urine dipstick MODERATE (*)    All other components within normal limits  BRAIN NATRIURETIC PEPTIDE - Abnormal; Notable for  the following components:   B Natriuretic Peptide 412.0 (*)    All other components within normal limits  PROTIME-INR - Abnormal; Notable for the following components:   Prothrombin Time 18.3 (*)    INR 1.5 (*)    All other components within normal limits  BASIC METABOLIC PANEL - Abnormal; Notable for the following components:   Glucose, Bld 121 (*)    BUN 28 (*)    Calcium  8.2 (*)    GFR, Estimated 53 (*)    All other components within normal limits  CBC - Abnormal; Notable for the following components:   WBC 14.0 (*)    RBC 3.71 (*)    Hemoglobin 10.2 (*)    HCT 32.2 (*)    RDW 18.5 (*)    All other components within normal limits  TROPONIN I (HIGH SENSITIVITY) - Abnormal; Notable for the following components:   Troponin I (High Sensitivity) 24 (*)    All other components within normal limits  RESP PANEL BY RT-PCR (RSV, FLU A&B, COVID)  RVPGX2  CULTURE, BLOOD (ROUTINE X 2)  CULTURE, BLOOD (ROUTINE X 2)  LACTIC ACID, PLASMA  APTT  CORTISOL-AM, BLOOD     EKG:  EKG Interpretation Date/Time:  Thursday November 05 2023 00:58:16 EST Ventricular Rate:  98 PR Interval:    QRS Duration:  82 QT Interval:  324 QTC Calculation: 413 R Axis:   68  Text Interpretation: Atrial fibrillation Nonspecific T wave abnormality Abnormal ECG When compared with ECG of 17-Jul-2022 16:33, PREVIOUS ECG IS PRESENT Confirmed by UNCONFIRMED, DOCTOR ([id number]), editor Alex Slough [phone]) on 11/05/2023 7:07:59 AM         RADIOLOGY: My personal review and interpretation of imaging: CT scan shows multifocal pneumonia versus aspiration.  Gallstones without  cholecystitis.  I have personally reviewed all radiology reports.   CT Angio Chest PE W and/or Wo Contrast Result Date: 11/05/2023 CLINICAL DATA:  Sepsis. Cough yesterday causing pain. Pulmonary embolus suspected with high probability. EXAM: CT ANGIOGRAPHY CHEST CT ABDOMEN AND PELVIS WITH CONTRAST TECHNIQUE: Multidetector CT imaging of the chest was performed using the standard protocol during bolus administration of intravenous contrast. Multiplanar CT image reconstructions and MIPs were obtained to evaluate the vascular anatomy. Multidetector CT imaging of the abdomen and pelvis was performed using the standard protocol during bolus administration of intravenous contrast. RADIATION DOSE REDUCTION: This exam was performed according to the departmental dose-optimization program which includes automated exposure control, adjustment of the mA and/or kV according to patient size and/or use of iterative reconstruction technique. CONTRAST:  OMNIPAQUE  IOHEXOL  350 MG/ML SOLN COMPARISON:  CT chest 08/28/2021. CT abdomen and pelvis 03/13/2018. Abdominal radiographs 07/20/2022. FINDINGS: CTA CHEST FINDINGS Cardiovascular: Technically adequate study with good opacification of the central and segmental pulmonary arteries. Mild motion artifact. No focal filling defects. No evidence of significant pulmonary embolus. Marked cardiac enlargement. Small pericardial effusion. Normal caliber thoracic aorta. No aortic dissection. Great vessel origins are patent. Mediastinum/Nodes: Esophagus is decompressed. Thyroid  gland is unremarkable. Prominent mediastinal lymph nodes with pretracheal nodes measuring up to 1.2 cm diameter. These are nonspecific but likely to be reactive. Lungs/Pleura: Emphysematous changes in the lungs. Patchy airspace disease throughout but most prominent in the upper lungs, right middle lung, and left lingula. Patchy airspace changes progressed to focal areas of consolidation. Changes may represent  multifocal pneumonia or aspiration. Mild bronchiectasis with secretions demonstrated in the airways. No pleural effusion or pneumothorax. Musculoskeletal: Degenerative changes in the spine. No acute bony abnormalities. Review  of the MIP images confirms the above findings. CT ABDOMEN and PELVIS FINDINGS Hepatobiliary: Cholelithiasis. No gallbladder wall thickening or stranding. No bile duct dilatation. No focal liver lesions. Pancreas: Unremarkable. No pancreatic ductal dilatation or surrounding inflammatory changes. Spleen: Normal in size without focal abnormality. Adrenals/Urinary Tract: Adrenal glands are unremarkable. Kidneys are normal, without renal calculi, focal lesion, or hydronephrosis. Bladder is unremarkable. Stomach/Bowel: Stomach, small bowel, and colon are not abnormally distended. No wall thickening or inflammatory changes. Colonic diverticulosis without evidence of acute diverticulitis. Vascular/Lymphatic: Aortic atherosclerosis. No enlarged abdominal or pelvic lymph nodes. Reproductive: Status post hysterectomy. No adnexal masses. Other: No abdominal wall hernia or abnormality. No abdominopelvic ascites. Musculoskeletal: Mild lumbar scoliosis convex towards the right. Degenerative changes in the spine and hips. No acute bony abnormalities. Review of the MIP images confirms the above findings. IMPRESSION: 1. No evidence of significant pulmonary embolus. 2. Patchy and confluent airspace disease in the lungs with bronchiectasis and airways secretions. Changes may represent multifocal pneumonia or aspiration. 3. Emphysematous changes in the lungs. 4. Aortic atherosclerosis. 5. Cholelithiasis.  No evidence of acute cholecystitis. 6. No bowel obstruction or inflammation. 7. Prominent cardiac enlargement with small pericardial effusion. Effusion is new since previous study. Electronically Signed   By: Elsie Gravely M.D.   On: 11/05/2023 00:47   CT ABDOMEN PELVIS W CONTRAST Result Date:  11/05/2023 CLINICAL DATA:  Sepsis. Cough yesterday causing pain. Pulmonary embolus suspected with high probability. EXAM: CT ANGIOGRAPHY CHEST CT ABDOMEN AND PELVIS WITH CONTRAST TECHNIQUE: Multidetector CT imaging of the chest was performed using the standard protocol during bolus administration of intravenous contrast. Multiplanar CT image reconstructions and MIPs were obtained to evaluate the vascular anatomy. Multidetector CT imaging of the abdomen and pelvis was performed using the standard protocol during bolus administration of intravenous contrast. RADIATION DOSE REDUCTION: This exam was performed according to the departmental dose-optimization program which includes automated exposure control, adjustment of the mA and/or kV according to patient size and/or use of iterative reconstruction technique. CONTRAST:  OMNIPAQUE  IOHEXOL  350 MG/ML SOLN COMPARISON:  CT chest 08/28/2021. CT abdomen and pelvis 03/13/2018. Abdominal radiographs 07/20/2022. FINDINGS: CTA CHEST FINDINGS Cardiovascular: Technically adequate study with good opacification of the central and segmental pulmonary arteries. Mild motion artifact. No focal filling defects. No evidence of significant pulmonary embolus. Marked cardiac enlargement. Small pericardial effusion. Normal caliber thoracic aorta. No aortic dissection. Great vessel origins are patent. Mediastinum/Nodes: Esophagus is decompressed. Thyroid  gland is unremarkable. Prominent mediastinal lymph nodes with pretracheal nodes measuring up to 1.2 cm diameter. These are nonspecific but likely to be reactive. Lungs/Pleura: Emphysematous changes in the lungs. Patchy airspace disease throughout but most prominent in the upper lungs, right middle lung, and left lingula. Patchy airspace changes progressed to focal areas of consolidation. Changes may represent multifocal pneumonia or aspiration. Mild bronchiectasis with secretions demonstrated in the airways. No pleural effusion or  pneumothorax. Musculoskeletal: Degenerative changes in the spine. No acute bony abnormalities. Review of the MIP images confirms the above findings. CT ABDOMEN and PELVIS FINDINGS Hepatobiliary: Cholelithiasis. No gallbladder wall thickening or stranding. No bile duct dilatation. No focal liver lesions. Pancreas: Unremarkable. No pancreatic ductal dilatation or surrounding inflammatory changes. Spleen: Normal in size without focal abnormality. Adrenals/Urinary Tract: Adrenal glands are unremarkable. Kidneys are normal, without renal calculi, focal lesion, or hydronephrosis. Bladder is unremarkable. Stomach/Bowel: Stomach, small bowel, and colon are not abnormally distended. No wall thickening or inflammatory changes. Colonic diverticulosis without evidence of acute diverticulitis. Vascular/Lymphatic: Aortic atherosclerosis. No enlarged  abdominal or pelvic lymph nodes. Reproductive: Status post hysterectomy. No adnexal masses. Other: No abdominal wall hernia or abnormality. No abdominopelvic ascites. Musculoskeletal: Mild lumbar scoliosis convex towards the right. Degenerative changes in the spine and hips. No acute bony abnormalities. Review of the MIP images confirms the above findings. IMPRESSION: 1. No evidence of significant pulmonary embolus. 2. Patchy and confluent airspace disease in the lungs with bronchiectasis and airways secretions. Changes may represent multifocal pneumonia or aspiration. 3. Emphysematous changes in the lungs. 4. Aortic atherosclerosis. 5. Cholelithiasis.  No evidence of acute cholecystitis. 6. No bowel obstruction or inflammation. 7. Prominent cardiac enlargement with small pericardial effusion. Effusion is new since previous study. Electronically Signed   By: Elsie Gravely M.D.   On: 11/05/2023 00:47   DG Chest 2 View Result Date: 11/04/2023 CLINICAL DATA:  Cough. EXAM: CHEST - 2 VIEW COMPARISON:  July 17, 2022. FINDINGS: Stable cardiomegaly. Probable right upper and middle  lobe opacities are noted concerning for atelectasis or scarring. Left lung is clear. Bony thorax is unremarkable. IMPRESSION: Probable right upper and middle lobe opacities are noted concerning for atelectasis or possibly scarring. These are mildly increased compared to prior exam. Electronically Signed   By: Lynwood Landy Raddle M.D.   On: 11/04/2023 16:28     PROCEDURES:  Critical Care performed: Yes, see critical care procedure note(s)   CRITICAL CARE Performed by: Josette Izear Pine   Total critical care time: 40 minutes  Critical care time was exclusive of separately billable procedures and treating other patients.  Critical care was necessary to treat or prevent imminent or life-threatening deterioration.  Critical care was time spent personally by me on the following activities: development of treatment plan with patient and/or surrogate as well as nursing, discussions with consultants, evaluation of patient's response to treatment, examination of patient, obtaining history from patient or surrogate, ordering and performing treatments and interventions, ordering and review of laboratory studies, ordering and review of radiographic studies, pulse oximetry and re-evaluation of patient's condition.   SABRA1-3 Lead EKG Interpretation  Performed by: Sahaana Weitman, Josette SAILOR, DO Authorized by: Zymiere Trostle, Josette SAILOR, DO     Interpretation: normal     ECG rate:  105   ECG rate assessment: tachycardic     Rhythm: sinus tachycardia     Ectopy: none     Conduction: normal       IMPRESSION / MDM / ASSESSMENT AND PLAN / ED COURSE  I reviewed the triage vital signs and the nursing notes.    Patient here with cough, vomiting.  She is concerned for aspiration as she thinks she choked on her protein drink yesterday.  The patient is on the cardiac monitor to evaluate for evidence of arrhythmia and/or significant heart rate changes.   DIFFERENTIAL DIAGNOSIS (includes but not limited to):   Aspiration pneumonia,  pneumonia, viral URI, viral gastroenteritis, UTI, sepsis   Patient's presentation is most consistent with acute presentation with potential threat to life or bodily function.   PLAN: Patient is febrile to 101.  Intermittently tachycardic.  No hypoxia.  Labs obtained from triage show leukocytosis of 16,000.  Patient meets sepsis criteria.  Will obtain cultures, urine.  Chest x-ray reviewed and interpreted by myself and radiologist and shows possible aspiration versus atelectasis versus scarring.  Will proceed with CT imaging for further evaluation.  Will give broad-spectrum antibiotics.   MEDICATIONS GIVEN IN ED: Medications  amiodarone  (PACERONE ) tablet 100 mg (has no administration in time range)  amLODipine  (NORVASC ) tablet 5  mg (has no administration in time range)  furosemide  (LASIX ) tablet 20 mg (has no administration in time range)  metoprolol  succinate (TOPROL -XL) 24 hr tablet 25 mg (has no administration in time range)  escitalopram  (LEXAPRO ) tablet 5 mg (has no administration in time range)  senna-docusate (Senokot-S) tablet 1 tablet (1 tablet Oral Not Given 11/05/23 0213)  apixaban  (ELIQUIS ) tablet 2.5 mg (has no administration in time range)  calcium -vitamin D  (OSCAL WITH D) 500-5 MG-MCG per tablet 1 tablet (1 tablet Oral Not Given 11/05/23 0212)  magnesium  oxide (MAG-OX) tablet 400 mg (400 mg Oral Not Given 11/05/23 0212)  potassium chloride  SA (KLOR-CON  M) CR tablet 20 mEq (has no administration in time range)  sodium chloride  tablet 1 g (has no administration in time range)  loratadine  (CLARITIN ) tablet 10 mg (has no administration in time range)  mometasone -formoterol  (DULERA ) 100-5 MCG/ACT inhaler 2 puff (2 puffs Inhalation Not Given 11/05/23 0212)  polyvinyl alcohol  (LIQUIFILM TEARS) 1.4 % ophthalmic solution 1 drop (has no administration in time range)  lactated ringers  infusion (150 mL/hr Intravenous New Bag/Given 11/05/23 0136)  acetaminophen  (TYLENOL ) tablet 650 mg (has no  administration in time range)    Or  acetaminophen  (TYLENOL ) suppository 650 mg (has no administration in time range)  traZODone  (DESYREL ) tablet 25 mg (has no administration in time range)  magnesium  hydroxide (MILK OF MAGNESIA) suspension 30 mL (has no administration in time range)  metoCLOPramide  (REGLAN ) injection 10 mg (has no administration in time range)  metroNIDAZOLE  (FLAGYL ) IVPB 500 mg (has no administration in time range)  aztreonam  (AZACTAM ) 1 g in sodium chloride  0.9 % 100 mL IVPB (1 g Intravenous New Bag/Given 11/05/23 0701)  guaiFENesin  (MUCINEX ) 12 hr tablet 600 mg (has no administration in time range)  chlorpheniramine-HYDROcodone (TUSSIONEX) 10-8 MG/5ML suspension 5 mL (has no administration in time range)  ipratropium-albuterol  (DUONEB) 0.5-2.5 (3) MG/3ML nebulizer solution 3 mL (has no administration in time range)  ipratropium-albuterol  (DUONEB) 0.5-2.5 (3) MG/3ML nebulizer solution 3 mL (has no administration in time range)  lactated ringers  bolus 1,000 mL (0 mLs Intravenous Stopped 11/05/23 0053)  aztreonam  (AZACTAM ) 2 g in sodium chloride  0.9 % 100 mL IVPB (0 g Intravenous Stopped 11/05/23 0027)  metroNIDAZOLE  (FLAGYL ) IVPB 500 mg (0 mg Intravenous Stopped 11/05/23 0103)  vancomycin  (VANCOCIN ) IVPB 1000 mg/200 mL premix (0 mg Intravenous Stopped 11/05/23 0121)  acetaminophen  (TYLENOL ) tablet 1,000 mg (1,000 mg Oral Given 11/05/23 0004)  iohexol  (OMNIPAQUE ) 350 MG/ML injection 100 mL (100 mLs Intravenous Contrast Given 11/05/23 0009)     ED COURSE: CT scans reviewed and interpreted by myself and the radiologist.  Patient has no PE.  She has multifocal pneumonia versus aspiration.  She is getting Flagyl  to cover for anaerobes.  She has gallstones without cholecystitis.  She also has a small pericardial effusion but no signs of cardiac tamponade clinically.  Will discuss with hospitalist for admission.  Lactic normal.  Blood pressure stable.  Patient still mentating normally.  No  hypoxia.   CONSULTS:  Consulted and discussed patient's case with hospitalist, Dr. Lawence.  I have recommended admission and consulting physician agrees and will place admission orders.  Patient (and family if present) agree with this plan.   I reviewed all nursing notes, vitals, pertinent previous records.  All labs, EKGs, imaging ordered have been independently reviewed and interpreted by myself.    OUTSIDE RECORDS REVIEWED: Reviewed last PCP note on 10/30/2023.       FINAL CLINICAL IMPRESSION(S) / ED DIAGNOSES  Final diagnoses:  Multifocal pneumonia  Vomiting in adult patient  Gallstones  Acute sepsis (HCC)     Rx / DC Orders   ED Discharge Orders     None        Note:  This document was prepared using Dragon voice recognition software and may include unintentional dictation errors.   Vivien Barretto, Josette SAILOR, DO 11/05/23 [phone]

## 2023-11-04 NOTE — Progress Notes (Signed)
 CODE SEPSIS - PHARMACY COMMUNICATION  **Broad Spectrum Antibiotics should be administered within 1 hour of Sepsis diagnosis**  Time Code Sepsis Called/Page Received: 2238  Antibiotics Ordered: Aztreonam , Flagyl , Vancomycin   Time of 1st antibiotic administration: 2355  Rankin CANDIE Dills, PharmD, [facility] 11/04/2023 11:45 PM

## 2023-11-04 NOTE — ED Triage Notes (Signed)
 Pt reports she drinks a protein shake every night and it causes her to cough usually, but last night it was worsening coughing so the point where she went to her PCP today. She reports esophageal pain and was sent here by her PCP for possible aspiration.

## 2023-11-04 NOTE — ED Triage Notes (Signed)
 First nurse note: Pt here from [facility] clinic, pt had a protein shake last night and possibly aspirated. Pt c/o of nausea and vomiting.   HR:101 98.0 132/72

## 2023-11-05 ENCOUNTER — Encounter: Payer: Self-pay | Admitting: Family Medicine

## 2023-11-05 ENCOUNTER — Other Ambulatory Visit: Payer: Self-pay

## 2023-11-05 DIAGNOSIS — I480 Paroxysmal atrial fibrillation: Secondary | ICD-10-CM

## 2023-11-05 DIAGNOSIS — K8020 Calculus of gallbladder without cholecystitis without obstruction: Secondary | ICD-10-CM | POA: Diagnosis not present

## 2023-11-05 DIAGNOSIS — I10 Essential (primary) hypertension: Secondary | ICD-10-CM

## 2023-11-05 DIAGNOSIS — I3139 Other pericardial effusion (noninflammatory): Secondary | ICD-10-CM | POA: Diagnosis not present

## 2023-11-05 DIAGNOSIS — A419 Sepsis, unspecified organism: Secondary | ICD-10-CM | POA: Diagnosis not present

## 2023-11-05 DIAGNOSIS — R918 Other nonspecific abnormal finding of lung field: Secondary | ICD-10-CM | POA: Diagnosis not present

## 2023-11-05 DIAGNOSIS — J189 Pneumonia, unspecified organism: Secondary | ICD-10-CM

## 2023-11-05 DIAGNOSIS — J90 Pleural effusion, not elsewhere classified: Secondary | ICD-10-CM | POA: Diagnosis not present

## 2023-11-05 LAB — URINALYSIS, W/ REFLEX TO CULTURE (INFECTION SUSPECTED)
Bacteria, UA: NONE SEEN
Bilirubin Urine: NEGATIVE
Glucose, UA: NEGATIVE mg/dL
Ketones, ur: NEGATIVE mg/dL
Leukocytes,Ua: NEGATIVE
Nitrite: NEGATIVE
Protein, ur: NEGATIVE mg/dL
Specific Gravity, Urine: 1.045 — ABNORMAL HIGH (ref 1.005–1.030)
pH: 6 (ref 5.0–8.0)

## 2023-11-05 LAB — PROTIME-INR
INR: 1.3 — ABNORMAL HIGH (ref 0.8–1.2)
INR: 1.5 — ABNORMAL HIGH (ref 0.8–1.2)
Prothrombin Time: 16.4 s — ABNORMAL HIGH (ref 11.4–15.2)
Prothrombin Time: 18.3 s — ABNORMAL HIGH (ref 11.4–15.2)

## 2023-11-05 LAB — RESP PANEL BY RT-PCR (RSV, FLU A&B, COVID)  RVPGX2
Influenza A by PCR: NEGATIVE
Influenza B by PCR: NEGATIVE
Resp Syncytial Virus by PCR: NEGATIVE
SARS Coronavirus 2 by RT PCR: NEGATIVE

## 2023-11-05 LAB — BASIC METABOLIC PANEL
Anion gap: 6 (ref 5–15)
BUN: 28 mg/dL — ABNORMAL HIGH (ref 8–23)
CO2: 27 mmol/L (ref 22–32)
Calcium: 8.2 mg/dL — ABNORMAL LOW (ref 8.9–10.3)
Chloride: 102 mmol/L (ref 98–111)
Creatinine, Ser: 0.99 mg/dL (ref 0.44–1.00)
GFR, Estimated: 53 mL/min — ABNORMAL LOW (ref >60)
Glucose, Bld: 121 mg/dL — ABNORMAL HIGH (ref 70–99)
Potassium: 3.5 mmol/L (ref 3.5–5.1)
Sodium: 135 mmol/L (ref 135–145)

## 2023-11-05 LAB — CBC
HCT: 32.2 % — ABNORMAL LOW (ref 36.0–46.0)
Hemoglobin: 10.2 g/dL — ABNORMAL LOW (ref 12.0–15.0)
MCH: 27.5 pg (ref 26.0–34.0)
MCHC: 31.7 g/dL (ref 30.0–36.0)
MCV: 86.8 fL (ref 80.0–100.0)
Platelets: 272 10*3/uL (ref 150–400)
RBC: 3.71 MIL/uL — ABNORMAL LOW (ref 3.87–5.11)
RDW: 18.5 % — ABNORMAL HIGH (ref 11.5–15.5)
WBC: 14 10*3/uL — ABNORMAL HIGH (ref 4.0–10.5)
nRBC: 0 % (ref 0.0–0.2)

## 2023-11-05 LAB — APTT: aPTT: 32 s (ref 24–36)

## 2023-11-05 LAB — LACTIC ACID, PLASMA: Lactic Acid, Venous: 1.3 mmol/L (ref 0.5–1.9)

## 2023-11-05 LAB — TROPONIN I (HIGH SENSITIVITY): Troponin I (High Sensitivity): 24 ng/L — ABNORMAL HIGH (ref <18)

## 2023-11-05 LAB — BRAIN NATRIURETIC PEPTIDE: B Natriuretic Peptide: 412 pg/mL — ABNORMAL HIGH (ref 0.0–100.0)

## 2023-11-05 LAB — CORTISOL-AM, BLOOD: Cortisol - AM: 13.6 ug/dL (ref 6.7–22.6)

## 2023-11-05 MED ORDER — IPRATROPIUM-ALBUTEROL 0.5-2.5 (3) MG/3ML IN SOLN: 3.0000 mL | RESPIRATORY_TRACT | Status: DC | PRN

## 2023-11-05 MED ORDER — SODIUM CHLORIDE 0.9 % IV SOLN
1.0000 g | Freq: Three times a day (TID) | INTRAVENOUS | Status: DC
Start: 2023-11-05 — End: 2023-11-05
  Administered 2023-11-05: 1 g via INTRAVENOUS
  Filled 2023-11-05 (×3): qty 5

## 2023-11-05 MED ORDER — GUAIFENESIN ER 600 MG PO TB12
600.0000 mg | ORAL_TABLET | Freq: Two times a day (BID) | ORAL | Status: DC
  Administered 2023-11-05: 600 mg via ORAL
  Filled 2023-11-05: qty 1

## 2023-11-05 MED ORDER — ENOXAPARIN SODIUM 40 MG/0.4ML IJ SOSY: 40.0000 mg | PREFILLED_SYRINGE | INTRAMUSCULAR | Status: DC

## 2023-11-05 MED ORDER — DOXYCYCLINE HYCLATE 100 MG PO TABS: 100.0000 mg | ORAL_TABLET | Freq: Every day | ORAL | 0 refills | Status: AC

## 2023-11-05 MED ORDER — LACTATED RINGERS IV SOLN
150.0000 mL/h | INTRAVENOUS | Status: DC
  Administered 2023-11-05: 150 mL/h via INTRAVENOUS

## 2023-11-05 MED ORDER — POLYVINYL ALCOHOL 1.4 % OP SOLN
1.0000 [drp] | Freq: Four times a day (QID) | OPHTHALMIC | Status: DC | PRN
Start: 2023-11-05 — End: 2023-11-05

## 2023-11-05 MED ORDER — METRONIDAZOLE 500 MG/100ML IV SOLN: 500.0000 mg | Freq: Two times a day (BID) | INTRAVENOUS | Status: DC

## 2023-11-05 MED ORDER — FUROSEMIDE 40 MG PO TABS
20.0000 mg | ORAL_TABLET | Freq: Every day | ORAL | Status: DC
  Administered 2023-11-05: 20 mg via ORAL
  Filled 2023-11-05: qty 1

## 2023-11-05 MED ORDER — SODIUM CHLORIDE 0.9 % IV SOLN: 2.0000 g | INTRAVENOUS | Status: DC

## 2023-11-05 MED ORDER — HYDROCOD POLI-CHLORPHE POLI ER 10-8 MG/5ML PO SUER: 5.0000 mL | Freq: Two times a day (BID) | ORAL | Status: DC | PRN

## 2023-11-05 MED ORDER — AMIODARONE HCL 200 MG PO TABS
100.0000 mg | ORAL_TABLET | Freq: Every day | ORAL | Status: DC
  Administered 2023-11-05: 100 mg via ORAL
  Filled 2023-11-05: qty 1

## 2023-11-05 MED ORDER — SENNOSIDES-DOCUSATE SODIUM 8.6-50 MG PO TABS: 1.0000 | ORAL_TABLET | Freq: Two times a day (BID) | ORAL | Status: DC

## 2023-11-05 MED ORDER — IPRATROPIUM-ALBUTEROL 0.5-2.5 (3) MG/3ML IN SOLN
3.0000 mL | Freq: Four times a day (QID) | RESPIRATORY_TRACT | Status: DC
  Administered 2023-11-05 (×2): 3 mL via RESPIRATORY_TRACT
  Filled 2023-11-05: qty 3

## 2023-11-05 MED ORDER — POTASSIUM CHLORIDE CRYS ER 20 MEQ PO TBCR
20.0000 meq | EXTENDED_RELEASE_TABLET | Freq: Every day | ORAL | Status: DC
Start: 2023-11-05 — End: 2023-11-05
  Administered 2023-11-05: 20 meq via ORAL
  Filled 2023-11-05: qty 1

## 2023-11-05 MED ORDER — APIXABAN 2.5 MG PO TABS
2.5000 mg | ORAL_TABLET | Freq: Every day | ORAL | Status: DC
  Administered 2023-11-05: 2.5 mg via ORAL
  Filled 2023-11-05: qty 1

## 2023-11-05 MED ORDER — MOMETASONE FURO-FORMOTEROL FUM 100-5 MCG/ACT IN AERO
2.0000 | INHALATION_SPRAY | Freq: Two times a day (BID) | RESPIRATORY_TRACT | Status: DC
Start: 2023-11-05 — End: 2023-11-05
  Administered 2023-11-05: 2 via RESPIRATORY_TRACT
  Filled 2023-11-05 (×2): qty 8.8

## 2023-11-05 MED ORDER — OYSTER SHELL CALCIUM/D3 500-5 MG-MCG PO TABS
1.0000 | ORAL_TABLET | Freq: Two times a day (BID) | ORAL | Status: DC
  Administered 2023-11-05: 1 via ORAL
  Filled 2023-11-05: qty 1

## 2023-11-05 MED ORDER — METOCLOPRAMIDE HCL 5 MG/ML IJ SOLN
10.0000 mg | Freq: Four times a day (QID) | INTRAMUSCULAR | Status: DC | PRN
  Administered 2023-11-05: 10 mg via INTRAVENOUS
  Filled 2023-11-05: qty 2

## 2023-11-05 MED ORDER — AMLODIPINE BESYLATE 5 MG PO TABS
5.0000 mg | ORAL_TABLET | Freq: Every day | ORAL | Status: DC
  Administered 2023-11-05: 5 mg via ORAL
  Filled 2023-11-05: qty 1

## 2023-11-05 MED ORDER — LORATADINE 10 MG PO TABS
10.0000 mg | ORAL_TABLET | Freq: Every day | ORAL | Status: DC
  Administered 2023-11-05: 10 mg via ORAL
  Filled 2023-11-05: qty 1

## 2023-11-05 MED ORDER — METOPROLOL SUCCINATE ER 25 MG PO TB24
25.0000 mg | ORAL_TABLET | Freq: Every day | ORAL | Status: DC
  Administered 2023-11-05: 25 mg via ORAL
  Filled 2023-11-05: qty 1

## 2023-11-05 MED ORDER — IOHEXOL 350 MG/ML SOLN
100.0000 mL | Freq: Once | INTRAVENOUS | Status: AC | PRN
  Administered 2023-11-05: 100 mL via INTRAVENOUS

## 2023-11-05 MED ORDER — ESCITALOPRAM OXALATE 10 MG PO TABS
5.0000 mg | ORAL_TABLET | Freq: Every day | ORAL | Status: DC
  Administered 2023-11-05: 5 mg via ORAL
  Filled 2023-11-05: qty 1

## 2023-11-05 MED ORDER — MAGNESIUM HYDROXIDE 400 MG/5ML PO SUSP: 30.0000 mL | Freq: Every day | ORAL | Status: DC | PRN

## 2023-11-05 MED ORDER — TRAZODONE HCL 50 MG PO TABS: 25.0000 mg | ORAL_TABLET | Freq: Every evening | ORAL | Status: DC | PRN

## 2023-11-05 MED ORDER — MAGNESIUM OXIDE -MG SUPPLEMENT 400 (240 MG) MG PO TABS
400.0000 mg | ORAL_TABLET | Freq: Two times a day (BID) | ORAL | Status: DC
  Administered 2023-11-05: 400 mg via ORAL
  Filled 2023-11-05: qty 1

## 2023-11-05 MED ORDER — ACETAMINOPHEN 325 MG PO TABS: 650.0000 mg | ORAL_TABLET | Freq: Four times a day (QID) | ORAL | Status: DC | PRN

## 2023-11-05 MED ORDER — ACETAMINOPHEN 325 MG RE SUPP: 650.0000 mg | Freq: Four times a day (QID) | RECTAL | Status: DC | PRN

## 2023-11-05 MED ORDER — SODIUM CHLORIDE 1 G PO TABS
1.0000 g | ORAL_TABLET | Freq: Two times a day (BID) | ORAL | Status: DC
  Administered 2023-11-05: 1 g via ORAL
  Filled 2023-11-05: qty 1

## 2023-11-05 NOTE — Evaluation (Signed)
 Clinical/Bedside Swallow Evaluation Patient Details  Name: [name] MRN: [id number] Date of Birth: 08/23/1927  Today's Date: 11/05/2023 Time: SLP Start Time (ACUTE ONLY): 0820 SLP Stop Time (ACUTE ONLY): 0850 SLP Time Calculation (min) (ACUTE ONLY): 30 min  Past Medical History:  Past Medical History:  Diagnosis Date   Allergic rhinitis    Arrhythmia    atrial fibrillation   Arthritis    CHF (congestive heart failure) (HCC)    Colon polyp    Edema    Esophageal spasm    GERD (gastroesophageal reflux disease)    Hiatal hernia    Hypertension    Past Surgical History:  Past Surgical History:  Procedure Laterality Date   APPENDECTOMY     CATARACT EXTRACTION     CORONARY STENT INTERVENTION N/A 08/30/2021   Procedure: CORONARY STENT INTERVENTION;  Surgeon: Florencio Cara BIRCH, MD;  Location: [facility] INVASIVE CV LAB;  Service: Cardiovascular;  Laterality: N/A;   dilatation and curatage     EYE SURGERY     HYSTEROTOMY     LEFT HEART CATH AND CORONARY ANGIOGRAPHY N/A 08/30/2021   Procedure: LEFT HEART CATH AND CORONARY ANGIOGRAPHY possible PCI and stent;  Surgeon: Florencio Cara BIRCH, MD;  Location: [facility] INVASIVE CV LAB;  Service: Cardiovascular;  Laterality: N/A;   OOPHORECTOMY     TONSILLECTOMY     HPI:  [name] is a 89 y.o. Caucasian female with medical history significant for CHF, GERD, essential hypertension and atrial fibrillation, who presented to the emergency room with acute onset of vomiting for 3 times yesterday followed by cough the same evening which has continued today and was congested with inability to expectorate.  Her cough has been associated with wheezing without significant dyspnea.  She stated she had forceful cough giving her sore throat and occasional chest pain. Chest CT 2/6: mphysematous changes in the lungs. Patchy airspace disease throughout but most prominent in the upper lungs, right middle lung, and left lingula. Patchy airspace changes progressed to focal  areas of consolidation. Changes may represent multifocal pneumonia or aspiration. Mild bronchiectasis with secretions demonstrated in the airways. No pleural effusion or pneumothorax. Pt on a regular solids and thin liquids diet. Pt with remote history of esophageal concerns (refer to DG esophagus from  2019).    Assessment / Plan / Recommendation  Clinical Impression  Pt seen for bedside swallow assessment in the setting of concern for aspiration PNA. Pt on room air with congested cough at baseline, noted intermittently during session (not related to PO). Pt seen with trials of thin liquids (via cup), puree, and regular solids. Pt self feeding for duration of trials. No overt or subtle s/sx pharyngeal dysphagia noted. No change to vocal quality across trials. Vitals stable for duration of trials. Oral phase grossly intact- with complete manipulation and clearance of regular solid from oral cavity.   Pt with episode of emesis DURING regular solids trial, with small amount of regurgitation collected. Pt reported not being able to keep anything down recently- given hx of esophageal concerns, recommend GI assessment. Based on current nausea/esophageal hx, pt is at risk for postprandrial aspiration; therefore, recommend elevating HOB 90 degrees during and at least 30 min after intake.   Further based on age and current deconditioning, pt is at increased risk for aspiration, recommend aspiration precautions (slow rate, small bites, and alert for PO intake). Pt to benefit from intermittent supervision to aid with monitoring for emesis and compliance with aspiration precautions. Continue with current unrestricted diet.  No follow up SLP services indicated. SLP Visit Diagnosis: Dysphagia, unspecified (R13.10)    Aspiration Risk  Mild aspiration risk    Diet Recommendation   Age appropriate regular;Thin  Medication Administration: Whole meds with puree    Other  Recommendations Recommended Consults: Consider  GI evaluation Oral Care Recommendations: Oral care BID    Recommendations for follow up therapy are one component of a multi-disciplinary discharge planning process, led by the attending physician.  Recommendations may be updated based on patient status, additional functional criteria and insurance authorization.  Follow up Recommendations No SLP follow up      Assistance Recommended at Discharge    Functional Status Assessment Patient has not had a recent decline in their functional status  Frequency and Duration            Prognosis Prognosis for improved oropharyngeal function:  (suspect at baseline)      Swallow Study   General Date of Onset: 11/05/23 HPI: [name] is a 89 y.o. Caucasian female with medical history significant for CHF, GERD, essential hypertension and atrial fibrillation, who presented to the emergency room with acute onset of vomiting for 3 times yesterday followed by cough the same evening which has continued today and was congested with inability to expectorate.  Her cough has been associated with wheezing without significant dyspnea.  She stated she had forceful cough giving her sore throat and occasional chest pain. Chest CT 2/6: mphysematous changes in the lungs. Patchy airspace disease throughout but most prominent in the upper lungs, right middle lung, and left lingula. Patchy airspace changes progressed to focal areas of consolidation. Changes may represent multifocal pneumonia or aspiration. Mild bronchiectasis with secretions demonstrated in the airways. No pleural effusion or pneumothorax. Pt on a regular solids and thin liquids diet. Pt with remote history of esophageal concerns (refer to DG esophagus from  2019). Type of Study: Bedside Swallow Evaluation Previous Swallow Assessment: Last bedside swallow assessment completed in 2021, with recommendation of regular solids and thin liquids Diet Prior to this Study: Regular;Thin liquids (Level 0) Temperature  Spikes Noted: Yes (Temp 101 (WBC 14)) Respiratory Status: Room air History of Recent Intubation: No Behavior/Cognition: Lethargic/Drowsy;Cooperative Oral Cavity Assessment: Within Functional Limits Oral Care Completed by SLP: Yes Oral Cavity - Dentition: Adequate natural dentition Vision: Functional for self-feeding Self-Feeding Abilities: Able to feed self;Needs set up Patient Positioning: Upright in bed Baseline Vocal Quality: Low vocal intensity Volitional Cough: Congested Volitional Swallow: Able to elicit    Oral/Motor/Sensory Function Overall Oral Motor/Sensory Function: Within functional limits   Ice Chips Ice chips: Not tested   Thin Liquid Thin Liquid: Within functional limits    Nectar Thick Nectar Thick Liquid: Not tested   Honey Thick Honey Thick Liquid: Not tested   Puree Puree: Within functional limits   Solid     Solid: Within functional limits     Arel Tippen Clapp  MS [facility] SLP   Kadee Philyaw J Clapp 11/05/2023,9:49 AM

## 2023-11-05 NOTE — Discharge Instructions (Signed)
 Please continue to take the antibiotic until it is completed to treat your pneumonia.  Your other home medications are unchanged.  I would like you to follow up with your primary doctor within 1-2 weeks to monitor your healing

## 2023-11-05 NOTE — Assessment & Plan Note (Signed)
-   We will continue antihypertensive therapy.

## 2023-11-05 NOTE — H&P (Signed)
 [place]   PATIENT NAME: [name]    MR#:  [id number]  DATE OF BIRTH:  [birth year]  DATE OF ADMISSION:  11/04/2023  PRIMARY CARE PHYSICIAN: Cleotilde Oneil FALCON, MD   Patient is coming from: Home  REQUESTING/REFERRING PHYSICIAN: Ward, Josette SAILOR, DO  CHIEF COMPLAINT:   Chief Complaint  Patient presents with   Cough    HISTORY OF PRESENT ILLNESS:  Rosaleigh Brazzel is a 89 y.o. Caucasian female with medical history significant for CHF, GERD, essential hypertension and atrial fibrillation, who presented to the emergency room with acute onset of vomiting for 3 times yesterday followed by cough the same evening which has continued today and was congested with inability to expectorate.  Her cough has been associated with wheezing without significant dyspnea.  She stated she had forceful cough giving her sore throat and occasional chest pain.  No fever or chills.  No palpitations.  No nausea or vomiting or abdominal pain.  No headache or dizziness or blurred vision.  She denied any dysuria, oliguria or hematuria or flank pain.  No bleeding diathesis.  ED Course: When the patient came to the ER, vital signs were within normal and later BP was 151/64 and temperature was later 101 with respiratory to 26.  BUN was 33 with a creatinine 0.93 and BNP was 412 with high sensitive troponin I of 24 and later 53.  CBC showed leukocytosis of 14 with anemia.  INR was 1.5 with PT 18.3 and respiratory panel came back negative.  UA was remarkable for specific gravity of 1045.  Blood cultures were drawn. EKG as reviewed by me : EKG showed atrial fibrillation with controlled ventricular response of 98 and Q waves inferiorly Imaging: Two-view chest x-ray showed the following Probable right upper and middle lobe opacities are noted concerning for atelectasis or possibly scarring. These are mildly increased compared to prior exam.   CTA of the chest abdomen pelvis revealed the following: 1. No evidence of  significant pulmonary embolus. 2. Patchy and confluent airspace disease in the lungs with bronchiectasis and airways secretions. Changes may represent multifocal pneumonia or aspiration. 3. Emphysematous changes in the lungs. 4. Aortic atherosclerosis. 5. Cholelithiasis.  No evidence of acute cholecystitis. 6. No bowel obstruction or inflammation. 7. Prominent cardiac enlargement with small pericardial effusion. Effusion is new since previous study.  The patient was given 2 g of IV Azactam , 500 mg of IV Flagyl  and 1 g of IV vancomycin  as well as 1 g of p.o. Tylenol .  She will be admitted to a medical telemetry bed for further evaluation and management.    PAST MEDICAL HISTORY:   Past Medical History:  Diagnosis Date   Allergic rhinitis    Arrhythmia    atrial fibrillation   Arthritis    CHF (congestive heart failure) (HCC)    Colon polyp    Edema    Esophageal spasm    GERD (gastroesophageal reflux disease)    Hiatal hernia    Hypertension     PAST SURGICAL HISTORY:   Past Surgical History:  Procedure Laterality Date   APPENDECTOMY     CATARACT EXTRACTION     CORONARY STENT INTERVENTION N/A 08/30/2021   Procedure: CORONARY STENT INTERVENTION;  Surgeon: Florencio Cara BIRCH, MD;  Location: [facility]IVE [facility];  Service: Cardiovascular;  Laterality: N/A;   dilatation and curatage     EYE SURGERY     HYSTEROTOMY     LEFT HEART CATH AND CORONARY ANGIOGRAPHY  N/A 08/30/2021   Procedure: LEFT HEART CATH AND CORONARY ANGIOGRAPHY possible PCI and stent;  Surgeon: Florencio Cara BIRCH, MD;  Location: [facility] CV [facility];  Service: Cardiovascular;  Laterality: N/A;   OOPHORECTOMY     TONSILLECTOMY      SOCIAL HISTORY:   Social History   Tobacco Use   Smoking status: Never   Smokeless tobacco: Never  Substance Use Topics   Alcohol  use: Never    FAMILY HISTORY:   Family History  Problem Relation Age of Onset   Dementia Mother    Heart attack Father     DRUG  ALLERGIES:   Allergies  Allergen Reactions   Amoxicillin-Pot Clavulanate Nausea Only   Cefprozil Nausea Only   Clarithromycin Nausea Only   Etodolac  Diarrhea   Prednisone      Other reaction(s): Other (See Comments) Hyperglycemia   Pseudoephedrine     Other reaction(s): Unknown   Penicillins Rash   Sulfa Antibiotics Rash    REVIEW OF SYSTEMS:   ROS As per history of present illness. All pertinent systems were reviewed above. Constitutional, HEENT, cardiovascular, respiratory, GI, GU, musculoskeletal, neuro, psychiatric, endocrine, integumentary and hematologic systems were reviewed and are otherwise negative/unremarkable except for positive findings mentioned above in the HPI.   MEDICATIONS AT HOME:   Prior to Admission medications   Medication Sig Start Date End Date Taking? Authorizing Provider  acetaminophen  (TYLENOL ) 325 MG tablet Take 1 tablet (325 mg total) by mouth every 6 (six) hours as needed for mild pain or fever. 07/24/22   Josette Ade, MD  amiodarone  (PACERONE ) 100 MG tablet Take 1 tablet (100 mg total) by mouth daily. 07/25/22   Josette Ade, MD  amLODipine  (NORVASC ) 10 MG tablet Take 1 tablet (10 mg total) by mouth daily. 07/25/22   Josette Ade, MD  apixaban  (ELIQUIS ) 2.5 MG TABS tablet Take 2.5 mg by mouth daily.    [provider]  Calcium  Carbonate-Vitamin D  600-200 MG-UNIT TABS Take 1 tablet by mouth 2 (two) times daily.    [provider]  carboxymethylcellul-glycerin  (REFRESH OPTIVE) 0.5-0.9 % ophthalmic solution Place 1 drop into both eyes 4 (four) times daily as needed for dry eyes.    [provider]  cetirizine (ZYRTEC) 10 MG tablet Take 10 mg by mouth at bedtime.    [provider]  escitalopram  (LEXAPRO ) 10 MG tablet Take 1 tablet (10 mg total) by mouth daily. 07/24/22   Josette Ade, MD  fluticasone -salmeterol (ADVAIR) 100-50 MCG/ACT AEPB Inhale 1 puff into the lungs 2 (two) times daily.    [provider]  furosemide  (LASIX ) 20 MG tablet TAKE 1 TABLET BY MOUTH DAILY 01/07/22   [place] A, FNP  magnesium  oxide (MAG-OX) 400 (240 Mg) MG tablet Take 1 tablet (400 mg total) by mouth 2 (two) times daily. 07/24/22   Josette Ade, MD  metoprolol  succinate (TOPROL -XL) 25 MG 24 hr tablet Take 1 tablet (25 mg total) by mouth daily. 07/25/22   Josette Ade, MD  ondansetron  (ZOFRAN -ODT) 4 MG disintegrating tablet Take 4 mg by mouth every 8 (eight) hours as needed for nausea/vomiting.    [provider]  potassium chloride  SA (KLOR-CON  M) 20 MEQ tablet Take 1 tablet (20 mEq total) by mouth daily. 07/24/22   Josette Ade, MD  senna-docusate (SENOKOT-S) 8.6-50 MG tablet Take 1 tablet by mouth 2 (two) times daily. 07/24/22   Josette Ade, MD  sodium chloride  1 g tablet Take 1 tablet (1 g total) by mouth 2 (two)  times daily with a meal. 07/24/22   Josette Ade, MD      VITAL SIGNS:  Blood pressure (!) 110/53, pulse 74, temperature 97.9 F (36.6 C), temperature source Oral, resp. rate (!) 22, height 5' 5 (1.651 m), weight 67.1 kg, SpO2 94%.  PHYSICAL EXAMINATION:  Physical Exam  GENERAL:  89 y.o.-year-old Caucasian female patient lying in the bed with no acute distress.  EYES: Pupils equal, round, reactive to light and accommodation. No scleral icterus. Extraocular muscles intact.  HEENT: Head atraumatic, normocephalic. Oropharynx and nasopharynx clear.  NECK:  Supple, no jugular venous distention. No thyroid  enlargement, no tenderness.  LUNGS: Some CARDIOVASCULAR: Regular rate and rhythm, S1, S2 normal. No murmurs, rubs, or gallops.  ABDOMEN: Soft, nondistended, nontender. Bowel sounds present. No organomegaly or mass.  EXTREMITIES: No pedal edema, cyanosis, or clubbing.  NEUROLOGIC: Cranial nerves II through XII are intact. Muscle strength 5/5 in all extremities. Sensation intact. Gait not checked.  PSYCHIATRIC: The patient is alert and oriented x 3.  Normal  affect and good eye contact. SKIN: No obvious rash, lesion, or ulcer.   LABORATORY PANEL:   CBC Recent Labs  Lab 11/05/23 0452  WBC 14.0*  HGB 10.2*  HCT 32.2*  PLT 272   ------------------------------------------------------------------------------------------------------------------  Chemistries  Recent Labs  Lab 11/05/23 0452  NA 135  K 3.5  CL 102  CO2 27  GLUCOSE 121*  BUN 28*  CREATININE 0.99  CALCIUM  8.2*   ------------------------------------------------------------------------------------------------------------------  Cardiac Enzymes No results for input(s): TROPONINI in the last 168 hours. ------------------------------------------------------------------------------------------------------------------  RADIOLOGY:  CT Angio Chest PE W and/or Wo Contrast Result Date: 11/05/2023 CLINICAL DATA:  Sepsis. Cough yesterday causing pain. Pulmonary embolus suspected with high probability. EXAM: CT ANGIOGRAPHY CHEST CT ABDOMEN AND PELVIS WITH CONTRAST TECHNIQUE: Multidetector CT imaging of the chest was performed using the standard protocol during bolus administration of intravenous contrast. Multiplanar CT image reconstructions and MIPs were obtained to evaluate the vascular anatomy. Multidetector CT imaging of the abdomen and pelvis was performed using the standard protocol during bolus administration of intravenous contrast. RADIATION DOSE REDUCTION: This exam was performed according to the departmental dose-optimization program which includes automated exposure control, adjustment of the mA and/or kV according to patient size and/or use of iterative reconstruction technique. CONTRAST:  OMNIPAQUE  IOHEXOL  350 MG/ML SOLN COMPARISON:  CT chest 08/28/2021. CT abdomen and pelvis 03/13/2018. Abdominal radiographs 07/20/2022. FINDINGS: CTA CHEST FINDINGS Cardiovascular: Technically adequate study with good opacification of the central and segmental pulmonary arteries. Mild  motion artifact. No focal filling defects. No evidence of significant pulmonary embolus. Marked cardiac enlargement. Small pericardial effusion. Normal caliber thoracic aorta. No aortic dissection. Great vessel origins are patent. Mediastinum/Nodes: Esophagus is decompressed. Thyroid  gland is unremarkable. Prominent mediastinal lymph nodes with pretracheal nodes measuring up to 1.2 cm diameter. These are nonspecific but likely to be reactive. Lungs/Pleura: Emphysematous changes in the lungs. Patchy airspace disease throughout but most prominent in the upper lungs, right middle lung, and left lingula. Patchy airspace changes progressed to focal areas of consolidation. Changes may represent multifocal pneumonia or aspiration. Mild bronchiectasis with secretions demonstrated in the airways. No pleural effusion or pneumothorax. Musculoskeletal: Degenerative changes in the spine. No acute bony abnormalities. Review of the MIP images confirms the above findings. CT ABDOMEN and PELVIS FINDINGS Hepatobiliary: Cholelithiasis. No gallbladder wall thickening or stranding. No bile duct dilatation. No focal liver lesions. Pancreas: Unremarkable. No pancreatic ductal dilatation or surrounding inflammatory changes. Spleen: Normal in size without focal abnormality. Adrenals/Urinary  Tract: Adrenal glands are unremarkable. Kidneys are normal, without renal calculi, focal lesion, or hydronephrosis. Bladder is unremarkable. Stomach/Bowel: Stomach, small bowel, and colon are not abnormally distended. No wall thickening or inflammatory changes. Colonic diverticulosis without evidence of acute diverticulitis. Vascular/Lymphatic: Aortic atherosclerosis. No enlarged abdominal or pelvic lymph nodes. Reproductive: Status post hysterectomy. No adnexal masses. Other: No abdominal wall hernia or abnormality. No abdominopelvic ascites. Musculoskeletal: Mild lumbar scoliosis convex towards the right. Degenerative changes in the spine and hips. No  acute bony abnormalities. Review of the MIP images confirms the above findings. IMPRESSION: 1. No evidence of significant pulmonary embolus. 2. Patchy and confluent airspace disease in the lungs with bronchiectasis and airways secretions. Changes may represent multifocal pneumonia or aspiration. 3. Emphysematous changes in the lungs. 4. Aortic atherosclerosis. 5. Cholelithiasis.  No evidence of acute cholecystitis. 6. No bowel obstruction or inflammation. 7. Prominent cardiac enlargement with small pericardial effusion. Effusion is new since previous study. Electronically Signed   By: Elsie Gravely M.D.   On: 11/05/2023 00:47   CT ABDOMEN PELVIS W CONTRAST Result Date: 11/05/2023 CLINICAL DATA:  Sepsis. Cough yesterday causing pain. Pulmonary embolus suspected with high probability. EXAM: CT ANGIOGRAPHY CHEST CT ABDOMEN AND PELVIS WITH CONTRAST TECHNIQUE: Multidetector CT imaging of the chest was performed using the standard protocol during bolus administration of intravenous contrast. Multiplanar CT image reconstructions and MIPs were obtained to evaluate the vascular anatomy. Multidetector CT imaging of the abdomen and pelvis was performed using the standard protocol during bolus administration of intravenous contrast. RADIATION DOSE REDUCTION: This exam was performed according to the departmental dose-optimization program which includes automated exposure control, adjustment of the mA and/or kV according to patient size and/or use of iterative reconstruction technique. CONTRAST:  OMNIPAQUE  IOHEXOL  350 MG/ML SOLN COMPARISON:  CT chest 08/28/2021. CT abdomen and pelvis 03/13/2018. Abdominal radiographs 07/20/2022. FINDINGS: CTA CHEST FINDINGS Cardiovascular: Technically adequate study with good opacification of the central and segmental pulmonary arteries. Mild motion artifact. No focal filling defects. No evidence of significant pulmonary embolus. Marked cardiac enlargement. Small pericardial effusion.  Normal caliber thoracic aorta. No aortic dissection. Great vessel origins are patent. Mediastinum/Nodes: Esophagus is decompressed. Thyroid  gland is unremarkable. Prominent mediastinal lymph nodes with pretracheal nodes measuring up to 1.2 cm diameter. These are nonspecific but likely to be reactive. Lungs/Pleura: Emphysematous changes in the lungs. Patchy airspace disease throughout but most prominent in the upper lungs, right middle lung, and left lingula. Patchy airspace changes progressed to focal areas of consolidation. Changes may represent multifocal pneumonia or aspiration. Mild bronchiectasis with secretions demonstrated in the airways. No pleural effusion or pneumothorax. Musculoskeletal: Degenerative changes in the spine. No acute bony abnormalities. Review of the MIP images confirms the above findings. CT ABDOMEN and PELVIS FINDINGS Hepatobiliary: Cholelithiasis. No gallbladder wall thickening or stranding. No bile duct dilatation. No focal liver lesions. Pancreas: Unremarkable. No pancreatic ductal dilatation or surrounding inflammatory changes. Spleen: Normal in size without focal abnormality. Adrenals/Urinary Tract: Adrenal glands are unremarkable. Kidneys are normal, without renal calculi, focal lesion, or hydronephrosis. Bladder is unremarkable. Stomach/Bowel: Stomach, small bowel, and colon are not abnormally distended. No wall thickening or inflammatory changes. Colonic diverticulosis without evidence of acute diverticulitis. Vascular/Lymphatic: Aortic atherosclerosis. No enlarged abdominal or pelvic lymph nodes. Reproductive: Status post hysterectomy. No adnexal masses. Other: No abdominal wall hernia or abnormality. No abdominopelvic ascites. Musculoskeletal: Mild lumbar scoliosis convex towards the right. Degenerative changes in the spine and hips. No acute bony abnormalities. Review of the MIP images confirms  the above findings. IMPRESSION: 1. No evidence of significant pulmonary embolus. 2.  Patchy and confluent airspace disease in the lungs with bronchiectasis and airways secretions. Changes may represent multifocal pneumonia or aspiration. 3. Emphysematous changes in the lungs. 4. Aortic atherosclerosis. 5. Cholelithiasis.  No evidence of acute cholecystitis. 6. No bowel obstruction or inflammation. 7. Prominent cardiac enlargement with small pericardial effusion. Effusion is new since previous study. Electronically Signed   By: Elsie Gravely M.D.   On: 11/05/2023 00:47   DG Chest 2 View Result Date: 11/04/2023 CLINICAL DATA:  Cough. EXAM: CHEST - 2 VIEW COMPARISON:  July 17, 2022. FINDINGS: Stable cardiomegaly. Probable right upper and middle lobe opacities are noted concerning for atelectasis or scarring. Left lung is clear. Bony thorax is unremarkable. IMPRESSION: Probable right upper and middle lobe opacities are noted concerning for atelectasis or possibly scarring. These are mildly increased compared to prior exam. Electronically Signed   By: Lynwood Landy Raddle M.D.   On: 11/04/2023 16:28      IMPRESSION AND PLAN:  Assessment and Plan: * Sepsis due to pneumonia [facility]) - This is due to multifocal pneumonia with differential diagnosis including bacterial pneumonia and aspiration pneumonia. - Sepsis manifested by leukocytosis, tachypnea of 26 and fever of 101 - The patient will be admitted to a medical bed. - We will continue antibiotic therapy with IV Azactam , Flagyl  and Zithromax . - We will follow blood cultures. - Mucolytic therapy and bronchodilator therapy will be provided. - Speech therapy consult will be obtained to assess her swallowing given the possibility of aspiration.  Depression - We will continue Lexapro .  Essential hypertension - We will continue antihypertensive therapy.  Paroxysmal atrial fibrillation (HCC) - We will continue amiodarone  and Eliquis .       DVT prophylaxis: Eliquis  Advanced Care Planning:  Code Status: The is DNR and DNI.  This was  discussed with her. Family Communication:  The plan of care was discussed in details with the patient (and family). I answered all questions. The patient agreed to proceed with the above mentioned plan. Further management will depend upon hospital course. Disposition Plan: Back to previous home environment Consults called: none.  All the records are reviewed and case discussed with ED provider.  Status is: Inpatient   At the time of the admission, it appears that the appropriate admission status for this patient is inpatient.  This is judged to be reasonable and necessary in order to provide the required intensity of service to ensure the patient's safety given the presenting symptoms, physical exam findings and initial radiographic and laboratory data in the context of comorbid conditions.  The patient requires inpatient status due to high intensity of service, high risk of further deterioration and high frequency of surveillance required.  I certify that at the time of admission, it is my clinical judgment that the patient will require inpatient hospital care extending more than 2 midnights.                            Dispo: The patient is from: Home              Anticipated d/c is to: Home              Patient currently is not medically stable to d/c.              Difficult to place patient: No  [place]ison DELENA Peaches M.D on 11/05/2023 at 5:50 AM  [facility]   From 7 PM-7 AM, contact night-coverage [email or web address]  CC: Primary care physician; Cleotilde Oneil FALCON, MD

## 2023-11-05 NOTE — Discharge Summary (Signed)
 Physician Discharge Summary  Patient: [name] FMW:[id number] DOB: July 12, 1927   Code Status: Limited: Do not attempt resuscitation (DNR) -DNR-LIMITED -Do Not Intubate/DNI  Admit date: 11/04/2023 Discharge date: 11/05/2023 Disposition: Home, No home health services recommended PCP: Cleotilde Oneil FALCON, MD  Recommendations for Outpatient Follow-up:  Follow up with PCP within 1-2 weeks Regarding general hospital follow up and preventative care  Discharge Diagnoses:  Principal Problem:   Sepsis due to pneumonia [facility]) Active Problems:   Depression   Paroxysmal atrial fibrillation [facility])   Essential hypertension  Brief Hospital Course Summary: [name] is a 89 y.o. female with medical history significant for CHF, GERD, essential hypertension and atrial fibrillation, who presented to the emergency room with acute onset of vomiting and congestion.   ED Course: vital signs were within normal and later BP was 151/64 and temperature was later 101 with respiratory to 26.  BUN was 33 with a creatinine 0.93 and BNP was 412 with high sensitive troponin I of 24 and later 53.  CBC showed leukocytosis of 14 with anemia.  INR was 1.5 with PT 18.3 and respiratory panel came back negative.  UA was remarkable for specific gravity of 1045.  Blood cultures were drawn. EKG : atrial fibrillation with controlled ventricular response of 98 and Q waves inferiorly Imaging: Two-view chest x-ray showed the following Probable right upper and middle lobe opacities are noted concerning for atelectasis or possibly scarring. These are mildly increased compared to prior exam.   CTA of the chest abdomen pelvis revealed the following: 1. No evidence of significant pulmonary embolus. 2. Patchy and confluent airspace disease in the lungs with bronchiectasis and airways secretions. Changes may represent multifocal pneumonia or aspiration. 3. Emphysematous changes in the lungs. 4. Aortic atherosclerosis. 5. Cholelithiasis.  No  evidence of acute cholecystitis. 6. No bowel obstruction or inflammation. 7. Prominent cardiac enlargement with small pericardial effusion. Effusion is new since previous study.   The patient was given 2 g of IV Azactam , 500 mg of IV Flagyl  and 1 g of IV vancomycin  as well as 1 g of p.o. Tylenol .  Same day as admission: patient was on room air with no desaturation with ambulation. Evaluated by SLP and had no significant aspiration. She was at her baseline. She is appropriate for outpatient management of aspiration PNA. She was prescribed oral antibiotic treatment and discharged home in stable condition on same day that she was admitted.   All other chronic conditions were treated with home medications.    Discharge Condition: Good, improved Recommended discharge diet: Regular healthy diet  Consultations: None   Procedures/Studies: None   Allergies as of 11/05/2023       Reactions   Amoxicillin-pot Clavulanate Nausea Only   Cefprozil Nausea Only   Clarithromycin Nausea Only   Etodolac  Diarrhea   Prednisone     Other reaction(s): Other (See Comments) Hyperglycemia   Pseudoephedrine    Other reaction(s): Unknown   Penicillins Rash   Sulfa Antibiotics Rash        Medication List     STOP taking these medications    senna-docusate 8.6-50 MG tablet Commonly known as: Senokot-S       TAKE these medications    acetaminophen  325 MG tablet Commonly known as: TYLENOL  Take 1 tablet (325 mg total) by mouth every 6 (six) hours as needed for mild pain or fever.   amiodarone  100 MG tablet Commonly known as: PACERONE  Take 1 tablet (100 mg total) by mouth daily.  amLODipine  5 MG tablet Commonly known as: NORVASC  Take 5 mg by mouth daily.   apixaban  2.5 MG Tabs tablet Commonly known as: ELIQUIS  Take 2.5 mg by mouth daily.   Calcium  Carbonate-Vitamin D  600-200 MG-UNIT Tabs Take 1 tablet by mouth 2 (two) times daily.   carboxymethylcellul-glycerin  0.5-0.9 % ophthalmic  solution Commonly known as: REFRESH OPTIVE Place 1 drop into both eyes 4 (four) times daily as needed for dry eyes.   cetirizine 10 MG tablet Commonly known as: ZYRTEC Take 10 mg by mouth at bedtime.   doxycycline  100 MG tablet Commonly known as: VIBRA -TABS Take 1 tablet (100 mg total) by mouth daily for 4 days.   escitalopram  5 MG tablet Commonly known as: LEXAPRO  Take 5 mg by mouth daily.   fluticasone -salmeterol 100-50 MCG/ACT Aepb Commonly known as: ADVAIR Inhale 1 puff into the lungs 2 (two) times daily.   furosemide  20 MG tablet Commonly known as: LASIX  TAKE 1 TABLET BY MOUTH DAILY   magnesium  oxide 400 (240 Mg) MG tablet Commonly known as: MAG-OX Take 1 tablet (400 mg total) by mouth 2 (two) times daily.   metoprolol  succinate 25 MG 24 hr tablet Commonly known as: TOPROL -XL Take 1 tablet (25 mg total) by mouth daily.   omeprazole 40 MG capsule Commonly known as: PRILOSEC Take 40 mg by mouth daily.   ondansetron  4 MG disintegrating tablet Commonly known as: ZOFRAN -ODT Take 4 mg by mouth every 8 (eight) hours as needed for nausea/vomiting.   potassium chloride  SA 20 MEQ tablet Commonly known as: KLOR-CON  M Take 1 tablet (20 mEq total) by mouth daily.   sodium chloride  1 g tablet Take 1 tablet (1 g total) by mouth 2 (two) times daily with a meal.        Follow-up Information     Cleotilde Oneil FALCON, MD. Schedule an appointment as soon as possible for a visit in 1 week(s).   Specialty: Internal Medicine Contact information: [phone] [facility] [address] [facility] [place] [address] [phone]                 Subjective   Pt reports feeling well. No SOB or CP. Has been able to independently ambulate to the commode with walker and feels at her baseline. She is happy to go home.Tolerated breakfast with no choking or vomiting.   All questions and concerns were addressed at time of discharge.  Objective  Blood pressure (!)  147/79, pulse 100, temperature 98.5 F (36.9 C), temperature source Oral, resp. rate (!) 25, height 5' 5 (1.651 m), weight 67.1 kg, SpO2 100%.   General: Pt is alert, awake, not in acute distress Cardiovascular: RRR, S1/S2 +, no rubs, no gallops Respiratory: CTA bilaterally, no wheezing, no rhonchi Abdominal: Soft, NT, ND, bowel sounds + Extremities: no edema, no cyanosis  The results of significant diagnostics from this hospitalization (including imaging, microbiology, ancillary and laboratory) are listed below for reference.   Imaging studies: CT Angio Chest PE W and/or Wo Contrast Result Date: 11/05/2023 CLINICAL DATA:  Sepsis. Cough yesterday causing pain. Pulmonary embolus suspected with high probability. EXAM: CT ANGIOGRAPHY CHEST CT ABDOMEN AND PELVIS WITH CONTRAST TECHNIQUE: Multidetector CT imaging of the chest was performed using the standard protocol during bolus administration of intravenous contrast. Multiplanar CT image reconstructions and MIPs were obtained to evaluate the vascular anatomy. Multidetector CT imaging of the abdomen and pelvis was performed using the standard protocol during bolus administration of intravenous contrast. RADIATION DOSE REDUCTION: This exam  was performed according to the departmental dose-optimization program which includes automated exposure control, adjustment of the mA and/or kV according to patient size and/or use of iterative reconstruction technique. CONTRAST:  OMNIPAQUE  IOHEXOL  350 MG/ML SOLN COMPARISON:  CT chest 08/28/2021. CT abdomen and pelvis 03/13/2018. Abdominal radiographs 07/20/2022. FINDINGS: CTA CHEST FINDINGS Cardiovascular: Technically adequate study with good opacification of the central and segmental pulmonary arteries. Mild motion artifact. No focal filling defects. No evidence of significant pulmonary embolus. Marked cardiac enlargement. Small pericardial effusion. Normal caliber thoracic aorta. No aortic dissection. Great vessel  origins are patent. Mediastinum/Nodes: Esophagus is decompressed. Thyroid  gland is unremarkable. Prominent mediastinal lymph nodes with pretracheal nodes measuring up to 1.2 cm diameter. These are nonspecific but likely to be reactive. Lungs/Pleura: Emphysematous changes in the lungs. Patchy airspace disease throughout but most prominent in the upper lungs, right middle lung, and left lingula. Patchy airspace changes progressed to focal areas of consolidation. Changes may represent multifocal pneumonia or aspiration. Mild bronchiectasis with secretions demonstrated in the airways. No pleural effusion or pneumothorax. Musculoskeletal: Degenerative changes in the spine. No acute bony abnormalities. Review of the MIP images confirms the above findings. CT ABDOMEN and PELVIS FINDINGS Hepatobiliary: Cholelithiasis. No gallbladder wall thickening or stranding. No bile duct dilatation. No focal liver lesions. Pancreas: Unremarkable. No pancreatic ductal dilatation or surrounding inflammatory changes. Spleen: Normal in size without focal abnormality. Adrenals/Urinary Tract: Adrenal glands are unremarkable. Kidneys are normal, without renal calculi, focal lesion, or hydronephrosis. Bladder is unremarkable. Stomach/Bowel: Stomach, small bowel, and colon are not abnormally distended. No wall thickening or inflammatory changes. Colonic diverticulosis without evidence of acute diverticulitis. Vascular/Lymphatic: Aortic atherosclerosis. No enlarged abdominal or pelvic lymph nodes. Reproductive: Status post hysterectomy. No adnexal masses. Other: No abdominal wall hernia or abnormality. No abdominopelvic ascites. Musculoskeletal: Mild lumbar scoliosis convex towards the right. Degenerative changes in the spine and hips. No acute bony abnormalities. Review of the MIP images confirms the above findings. IMPRESSION: 1. No evidence of significant pulmonary embolus. 2. Patchy and confluent airspace disease in the lungs with  bronchiectasis and airways secretions. Changes may represent multifocal pneumonia or aspiration. 3. Emphysematous changes in the lungs. 4. Aortic atherosclerosis. 5. Cholelithiasis.  No evidence of acute cholecystitis. 6. No bowel obstruction or inflammation. 7. Prominent cardiac enlargement with small pericardial effusion. Effusion is new since previous study. Electronically Signed   By: Elsie Gravely M.D.   On: 11/05/2023 00:47   CT ABDOMEN PELVIS W CONTRAST Result Date: 11/05/2023 CLINICAL DATA:  Sepsis. Cough yesterday causing pain. Pulmonary embolus suspected with high probability. EXAM: CT ANGIOGRAPHY CHEST CT ABDOMEN AND PELVIS WITH CONTRAST TECHNIQUE: Multidetector CT imaging of the chest was performed using the standard protocol during bolus administration of intravenous contrast. Multiplanar CT image reconstructions and MIPs were obtained to evaluate the vascular anatomy. Multidetector CT imaging of the abdomen and pelvis was performed using the standard protocol during bolus administration of intravenous contrast. RADIATION DOSE REDUCTION: This exam was performed according to the departmental dose-optimization program which includes automated exposure control, adjustment of the mA and/or kV according to patient size and/or use of iterative reconstruction technique. CONTRAST:  OMNIPAQUE  IOHEXOL  350 MG/ML SOLN COMPARISON:  CT chest 08/28/2021. CT abdomen and pelvis 03/13/2018. Abdominal radiographs 07/20/2022. FINDINGS: CTA CHEST FINDINGS Cardiovascular: Technically adequate study with good opacification of the central and segmental pulmonary arteries. Mild motion artifact. No focal filling defects. No evidence of significant pulmonary embolus. Marked cardiac enlargement. Small pericardial effusion. Normal caliber thoracic aorta. No aortic  dissection. Great vessel origins are patent. Mediastinum/Nodes: Esophagus is decompressed. Thyroid  gland is unremarkable. Prominent mediastinal lymph nodes  with pretracheal nodes measuring up to 1.2 cm diameter. These are nonspecific but likely to be reactive. Lungs/Pleura: Emphysematous changes in the lungs. Patchy airspace disease throughout but most prominent in the upper lungs, right middle lung, and left lingula. Patchy airspace changes progressed to focal areas of consolidation. Changes may represent multifocal pneumonia or aspiration. Mild bronchiectasis with secretions demonstrated in the airways. No pleural effusion or pneumothorax. Musculoskeletal: Degenerative changes in the spine. No acute bony abnormalities. Review of the MIP images confirms the above findings. CT ABDOMEN and PELVIS FINDINGS Hepatobiliary: Cholelithiasis. No gallbladder wall thickening or stranding. No bile duct dilatation. No focal liver lesions. Pancreas: Unremarkable. No pancreatic ductal dilatation or surrounding inflammatory changes. Spleen: Normal in size without focal abnormality. Adrenals/Urinary Tract: Adrenal glands are unremarkable. Kidneys are normal, without renal calculi, focal lesion, or hydronephrosis. Bladder is unremarkable. Stomach/Bowel: Stomach, small bowel, and colon are not abnormally distended. No wall thickening or inflammatory changes. Colonic diverticulosis without evidence of acute diverticulitis. Vascular/Lymphatic: Aortic atherosclerosis. No enlarged abdominal or pelvic lymph nodes. Reproductive: Status post hysterectomy. No adnexal masses. Other: No abdominal wall hernia or abnormality. No abdominopelvic ascites. Musculoskeletal: Mild lumbar scoliosis convex towards the right. Degenerative changes in the spine and hips. No acute bony abnormalities. Review of the MIP images confirms the above findings. IMPRESSION: 1. No evidence of significant pulmonary embolus. 2. Patchy and confluent airspace disease in the lungs with bronchiectasis and airways secretions. Changes may represent multifocal pneumonia or aspiration. 3. Emphysematous changes in the lungs. 4.  Aortic atherosclerosis. 5. Cholelithiasis.  No evidence of acute cholecystitis. 6. No bowel obstruction or inflammation. 7. Prominent cardiac enlargement with small pericardial effusion. Effusion is new since previous study. Electronically Signed   By: Elsie Gravely M.D.   On: 11/05/2023 00:47   DG Chest 2 View Result Date: 11/04/2023 CLINICAL DATA:  Cough. EXAM: CHEST - 2 VIEW COMPARISON:  July 17, 2022. FINDINGS: Stable cardiomegaly. Probable right upper and middle lobe opacities are noted concerning for atelectasis or scarring. Left lung is clear. Bony thorax is unremarkable. IMPRESSION: Probable right upper and middle lobe opacities are noted concerning for atelectasis or possibly scarring. These are mildly increased compared to prior exam. Electronically Signed   By: Lynwood Landy Raddle M.D.   On: 11/04/2023 16:28    Labs: Basic Metabolic Panel: Recent Labs  Lab 11/04/23 1457 11/05/23 0452  NA 139 135  K 3.8 3.5  CL 103 102  CO2 26 27  GLUCOSE 134* 121*  BUN 33* 28*  CREATININE 0.93 0.99  CALCIUM  9.0 8.2*   CBC: Recent Labs  Lab 11/04/23 1457 11/05/23 0452  WBC 16.2* 14.0*  NEUTROABS 14.7*  --   HGB 12.5 10.2*  HCT 38.7 32.2*  MCV 85.6 86.8  PLT 316 272   Microbiology: Results for orders placed or performed during the hospital encounter of 11/04/23  Blood Culture (routine x 2)     Status: None (Preliminary result)   Collection Time: 11/04/23 11:34 PM   Specimen: BLOOD  Result Value Ref Range Status   Specimen Description BLOOD RIGHT ASSIST CONTROL  Final   Special Requests   Final    BOTTLES DRAWN AEROBIC AND ANAEROBIC Blood Culture adequate volume   Culture   Final    NO GROWTH < 12 HOURS Performed at [facility], [address] [place], [place] [address]    Report Status  PENDING  Incomplete  Resp panel by RT-PCR (RSV, Flu A&B, Covid) Anterior Nasal Swab     Status: None   Collection Time: 11/04/23 11:42 PM   Specimen: Anterior Nasal Swab  Result  Value Ref Range Status   SARS Coronavirus 2 by RT PCR NEGATIVE NEGATIVE Final    Comment: (NOTE) SARS-CoV-2 target nucleic acids are NOT DETECTED.  The SARS-CoV-2 RNA is generally detectable in upper respiratory specimens during the acute phase of infection. The lowest concentration of SARS-CoV-2 viral copies this assay can detect is 138 copies/mL. A negative result does not preclude SARS-Cov-2 infection and should not be used as the sole basis for treatment or other patient management decisions. A negative result may occur with  improper specimen collection/handling, submission of specimen other than nasopharyngeal swab, presence of viral mutation(s) within the areas targeted by this assay, and inadequate number of viral copies(<138 copies/mL). A negative result must be combined with clinical observations, patient history, and epidemiological information. The expected result is Negative.  Fact Sheet for Patients:  bloggercourse.com  Fact Sheet for Healthcare Providers:  seriousbroker.it  This test is no t yet approved or cleared by the [place]  FDA and  has been authorized for detection and/or diagnosis of SARS-CoV-2 by FDA under an Emergency Use Authorization (EUA). This EUA will remain  in effect (meaning this test can be used) for the duration of the COVID-19 declaration under Section 564(b)(1) of the Act, 21 U.S.C.section 360bbb-3(b)(1), unless the authorization is terminated  or revoked sooner.       Influenza A by PCR NEGATIVE NEGATIVE Final   Influenza B by PCR NEGATIVE NEGATIVE Final    Comment: (NOTE) The Xpert Xpress SARS-CoV-2/FLU/RSV plus assay is intended as an aid in the diagnosis of influenza from Nasopharyngeal swab specimens and should not be used as a sole basis for treatment. Nasal washings and aspirates are unacceptable for Xpert Xpress SARS-CoV-2/FLU/RSV testing.  Fact Sheet for  Patients: bloggercourse.com  Fact Sheet for Healthcare Providers: seriousbroker.it  This test is not yet approved or cleared by the [place]  FDA and has been authorized for detection and/or diagnosis of SARS-CoV-2 by FDA under an Emergency Use Authorization (EUA). This EUA will remain in effect (meaning this test can be used) for the duration of the COVID-19 declaration under Section 564(b)(1) of the Act, 21 U.S.C. section 360bbb-3(b)(1), unless the authorization is terminated or revoked.     Resp Syncytial Virus by PCR NEGATIVE NEGATIVE Final    Comment: (NOTE) Fact Sheet for Patients: bloggercourse.com  Fact Sheet for Healthcare Providers: seriousbroker.it  This test is not yet approved or cleared by the [place]  FDA and has been authorized for detection and/or diagnosis of SARS-CoV-2 by FDA under an Emergency Use Authorization (EUA). This EUA will remain in effect (meaning this test can be used) for the duration of the COVID-19 declaration under Section 564(b)(1) of the Act, 21 U.S.C. section 360bbb-3(b)(1), unless the authorization is terminated or revoked.  Performed at [facility], [address] [place], [place] [address]   Blood Culture (routine x 2)     Status: None (Preliminary result)   Collection Time: 11/04/23 11:48 PM   Specimen: BLOOD  Result Value Ref Range Status   Specimen Description BLOOD LEFT ASSIST CONTROL  Final   Special Requests   Final    BOTTLES DRAWN AEROBIC AND ANAEROBIC Blood Culture adequate volume   Culture   Final    NO GROWTH < 12 HOURS Performed at [facility], [address], [place], [place] [address]    Report Status PENDING  Incomplete    Time coordinating discharge: Over 30 minutes  Marien LITTIE Piety, MD  [facility] 11/05/2023, 11:21 AM

## 2023-11-05 NOTE — ED Notes (Signed)
 Pt niece Garnetta Justin called for an update with Pts permission d/t possibility of d/c today

## 2023-11-05 NOTE — Assessment & Plan Note (Signed)
 -  We will continue amiodarone and Eliquis.

## 2023-11-05 NOTE — ED Notes (Signed)
 OT at bedside.

## 2023-11-05 NOTE — Assessment & Plan Note (Signed)
-   We will continue Lexapro. 

## 2023-11-05 NOTE — Care Management CC44 (Signed)
 Condition Code 44 Documentation Completed  Patient Details  Name: [name] MRN: [id number] Date of Birth: December 15, 1926   Condition Code 44 given:  Yes Patient signature on Condition Code 44 notice:  Yes Documentation of 2 MD's agreement:  Yes Code 44 added to claim:  Yes  I, Silvano Molt, LCSW, verbally reviewed observation notice.  11/05/2023, 12:36 PM    Silvano Molt, LCSW 11/05/2023, 12:35 PM

## 2023-11-05 NOTE — Assessment & Plan Note (Addendum)
-   This is due to multifocal pneumonia with differential diagnosis including bacterial pneumonia and aspiration pneumonia. - Sepsis manifested by leukocytosis, tachypnea of 26 and fever of 101 - The patient will be admitted to a medical bed. - We will continue antibiotic therapy with IV Azactam , Flagyl  and Zithromax . - We will follow blood cultures. - Mucolytic therapy and bronchodilator therapy will be provided. - Speech therapy consult will be obtained to assess her swallowing given the possibility of aspiration.

## 2023-11-05 NOTE — ED Notes (Signed)
 Patient transported to CT

## 2023-11-05 NOTE — ED Notes (Signed)
 Incentive spirometer with teachback given to Pt at d/c

## 2023-11-05 NOTE — Progress Notes (Signed)
   11/05/23 0950  Spiritual Encounters  Type of Visit Initial  Care provided to: Patient  Referral source Chaplain assessment  Reason for visit Routine spiritual support  OnCall Visit No  Interventions  Spiritual Care Interventions Made Established relationship of care and support  Intervention Outcomes  Outcomes Connection to spiritual care  Spiritual Care Plan  Spiritual Care Issues Still Outstanding No further spiritual care needs at this time (see row info)

## 2023-11-05 NOTE — ED Notes (Signed)
 Pt ambulated in room with front wheel walker. Pulse Ox reading between 94%-95% with activity. No C/o of SOB, states she feels fine. No events. Pt back on bed.

## 2023-11-10 LAB — CULTURE, BLOOD (ROUTINE X 2)
Culture: NO GROWTH
Culture: NO GROWTH
Special Requests: ADEQUATE
Special Requests: ADEQUATE

## 2023-11-16 DIAGNOSIS — I4892 Unspecified atrial flutter: Secondary | ICD-10-CM | POA: Diagnosis not present

## 2023-11-16 DIAGNOSIS — I4891 Unspecified atrial fibrillation: Secondary | ICD-10-CM | POA: Diagnosis not present

## 2023-11-16 DIAGNOSIS — Z09 Encounter for follow-up examination after completed treatment for conditions other than malignant neoplasm: Secondary | ICD-10-CM | POA: Diagnosis not present

## 2023-11-16 DIAGNOSIS — J189 Pneumonia, unspecified organism: Secondary | ICD-10-CM | POA: Diagnosis not present

## 2023-11-16 DIAGNOSIS — F330 Major depressive disorder, recurrent, mild: Secondary | ICD-10-CM | POA: Diagnosis not present

## 2023-12-09 ENCOUNTER — Other Ambulatory Visit
Admission: RE | Admit: 2023-12-09 | Discharge: 2023-12-09 | Disposition: A | Discharge status: Home / Self Care | Source: Ambulatory Visit | Attending: Physician Assistant | Admitting: Physician Assistant

## 2023-12-09 DIAGNOSIS — J4520 Mild intermittent asthma, uncomplicated: Secondary | ICD-10-CM | POA: Diagnosis not present

## 2023-12-09 DIAGNOSIS — R0789 Other chest pain: Secondary | ICD-10-CM | POA: Diagnosis not present

## 2023-12-09 DIAGNOSIS — N189 Chronic kidney disease, unspecified: Secondary | ICD-10-CM | POA: Diagnosis not present

## 2023-12-09 DIAGNOSIS — R1110 Vomiting, unspecified: Secondary | ICD-10-CM | POA: Diagnosis not present

## 2023-12-09 DIAGNOSIS — R918 Other nonspecific abnormal finding of lung field: Secondary | ICD-10-CM | POA: Diagnosis not present

## 2023-12-09 DIAGNOSIS — R079 Chest pain, unspecified: Secondary | ICD-10-CM | POA: Diagnosis not present

## 2023-12-09 DIAGNOSIS — I130 Hypertensive heart and chronic kidney disease with heart failure and stage 1 through stage 4 chronic kidney disease, or unspecified chronic kidney disease: Secondary | ICD-10-CM | POA: Diagnosis not present

## 2023-12-09 DIAGNOSIS — R7989 Other specified abnormal findings of blood chemistry: Secondary | ICD-10-CM | POA: Diagnosis not present

## 2023-12-09 DIAGNOSIS — R110 Nausea: Secondary | ICD-10-CM | POA: Diagnosis not present

## 2023-12-09 DIAGNOSIS — Z66 Do not resuscitate: Secondary | ICD-10-CM | POA: Diagnosis not present

## 2023-12-09 DIAGNOSIS — J690 Pneumonitis due to inhalation of food and vomit: Secondary | ICD-10-CM | POA: Diagnosis not present

## 2023-12-09 DIAGNOSIS — E440 Moderate protein-calorie malnutrition: Secondary | ICD-10-CM | POA: Diagnosis not present

## 2023-12-09 DIAGNOSIS — I4891 Unspecified atrial fibrillation: Secondary | ICD-10-CM | POA: Diagnosis not present

## 2023-12-09 DIAGNOSIS — E8809 Other disorders of plasma-protein metabolism, not elsewhere classified: Secondary | ICD-10-CM | POA: Diagnosis not present

## 2023-12-09 DIAGNOSIS — R590 Localized enlarged lymph nodes: Secondary | ICD-10-CM | POA: Diagnosis not present

## 2023-12-09 DIAGNOSIS — T17908A Unspecified foreign body in respiratory tract, part unspecified causing other injury, initial encounter: Secondary | ICD-10-CM | POA: Diagnosis not present

## 2023-12-09 DIAGNOSIS — R634 Abnormal weight loss: Secondary | ICD-10-CM | POA: Diagnosis not present

## 2023-12-09 DIAGNOSIS — R1310 Dysphagia, unspecified: Secondary | ICD-10-CM | POA: Diagnosis not present

## 2023-12-09 DIAGNOSIS — R112 Nausea with vomiting, unspecified: Secondary | ICD-10-CM | POA: Diagnosis not present

## 2023-12-09 DIAGNOSIS — R519 Headache, unspecified: Secondary | ICD-10-CM | POA: Diagnosis not present

## 2023-12-09 DIAGNOSIS — I5022 Chronic systolic (congestive) heart failure: Secondary | ICD-10-CM | POA: Diagnosis not present

## 2023-12-09 DIAGNOSIS — F32A Depression, unspecified: Secondary | ICD-10-CM | POA: Diagnosis not present

## 2023-12-09 LAB — TROPONIN I (HIGH SENSITIVITY): Troponin I (High Sensitivity): 43 ng/L — ABNORMAL HIGH (ref <18)

## 2023-12-10 DIAGNOSIS — T17908A Unspecified foreign body in respiratory tract, part unspecified causing other injury, initial encounter: Secondary | ICD-10-CM | POA: Diagnosis not present

## 2023-12-12 DIAGNOSIS — R112 Nausea with vomiting, unspecified: Secondary | ICD-10-CM | POA: Diagnosis not present

## 2023-12-22 DIAGNOSIS — E46 Unspecified protein-calorie malnutrition: Secondary | ICD-10-CM | POA: Diagnosis not present

## 2023-12-22 DIAGNOSIS — F32A Depression, unspecified: Secondary | ICD-10-CM | POA: Diagnosis not present

## 2023-12-22 DIAGNOSIS — I4891 Unspecified atrial fibrillation: Secondary | ICD-10-CM | POA: Diagnosis not present

## 2024-01-02 DIAGNOSIS — K219 Gastro-esophageal reflux disease without esophagitis: Secondary | ICD-10-CM | POA: Diagnosis not present

## 2024-01-02 DIAGNOSIS — I110 Hypertensive heart disease with heart failure: Secondary | ICD-10-CM | POA: Diagnosis not present

## 2024-01-02 DIAGNOSIS — R634 Abnormal weight loss: Secondary | ICD-10-CM | POA: Diagnosis not present

## 2024-01-02 DIAGNOSIS — Z7901 Long term (current) use of anticoagulants: Secondary | ICD-10-CM | POA: Diagnosis not present

## 2024-01-02 DIAGNOSIS — I4891 Unspecified atrial fibrillation: Secondary | ICD-10-CM | POA: Diagnosis not present

## 2024-01-02 DIAGNOSIS — R269 Unspecified abnormalities of gait and mobility: Secondary | ICD-10-CM | POA: Diagnosis not present

## 2024-01-02 DIAGNOSIS — I509 Heart failure, unspecified: Secondary | ICD-10-CM | POA: Diagnosis not present

## 2024-01-02 DIAGNOSIS — R32 Unspecified urinary incontinence: Secondary | ICD-10-CM | POA: Diagnosis not present

## 2024-01-02 DIAGNOSIS — K449 Diaphragmatic hernia without obstruction or gangrene: Secondary | ICD-10-CM | POA: Diagnosis not present

## 2024-01-05 DIAGNOSIS — I482 Chronic atrial fibrillation, unspecified: Secondary | ICD-10-CM | POA: Diagnosis not present

## 2024-01-05 DIAGNOSIS — I5023 Acute on chronic systolic (congestive) heart failure: Secondary | ICD-10-CM | POA: Diagnosis not present

## 2024-01-07 DIAGNOSIS — R269 Unspecified abnormalities of gait and mobility: Secondary | ICD-10-CM | POA: Diagnosis not present

## 2024-01-07 DIAGNOSIS — I509 Heart failure, unspecified: Secondary | ICD-10-CM | POA: Diagnosis not present

## 2024-01-07 DIAGNOSIS — I110 Hypertensive heart disease with heart failure: Secondary | ICD-10-CM | POA: Diagnosis not present

## 2024-01-07 DIAGNOSIS — R32 Unspecified urinary incontinence: Secondary | ICD-10-CM | POA: Diagnosis not present

## 2024-01-07 DIAGNOSIS — R634 Abnormal weight loss: Secondary | ICD-10-CM | POA: Diagnosis not present

## 2024-01-07 DIAGNOSIS — K449 Diaphragmatic hernia without obstruction or gangrene: Secondary | ICD-10-CM | POA: Diagnosis not present

## 2024-01-07 DIAGNOSIS — K219 Gastro-esophageal reflux disease without esophagitis: Secondary | ICD-10-CM | POA: Diagnosis not present

## 2024-01-07 DIAGNOSIS — Z7901 Long term (current) use of anticoagulants: Secondary | ICD-10-CM | POA: Diagnosis not present

## 2024-01-07 DIAGNOSIS — I4891 Unspecified atrial fibrillation: Secondary | ICD-10-CM | POA: Diagnosis not present

## 2024-01-08 DIAGNOSIS — R634 Abnormal weight loss: Secondary | ICD-10-CM | POA: Diagnosis not present

## 2024-01-08 DIAGNOSIS — I509 Heart failure, unspecified: Secondary | ICD-10-CM | POA: Diagnosis not present

## 2024-01-08 DIAGNOSIS — R32 Unspecified urinary incontinence: Secondary | ICD-10-CM | POA: Diagnosis not present

## 2024-01-08 DIAGNOSIS — R269 Unspecified abnormalities of gait and mobility: Secondary | ICD-10-CM | POA: Diagnosis not present

## 2024-01-08 DIAGNOSIS — K219 Gastro-esophageal reflux disease without esophagitis: Secondary | ICD-10-CM | POA: Diagnosis not present

## 2024-01-08 DIAGNOSIS — K449 Diaphragmatic hernia without obstruction or gangrene: Secondary | ICD-10-CM | POA: Diagnosis not present

## 2024-01-08 DIAGNOSIS — Z7901 Long term (current) use of anticoagulants: Secondary | ICD-10-CM | POA: Diagnosis not present

## 2024-01-08 DIAGNOSIS — I110 Hypertensive heart disease with heart failure: Secondary | ICD-10-CM | POA: Diagnosis not present

## 2024-01-08 DIAGNOSIS — I4891 Unspecified atrial fibrillation: Secondary | ICD-10-CM | POA: Diagnosis not present

## 2024-01-11 DIAGNOSIS — I4891 Unspecified atrial fibrillation: Secondary | ICD-10-CM | POA: Diagnosis not present

## 2024-01-11 DIAGNOSIS — K449 Diaphragmatic hernia without obstruction or gangrene: Secondary | ICD-10-CM | POA: Diagnosis not present

## 2024-01-11 DIAGNOSIS — R269 Unspecified abnormalities of gait and mobility: Secondary | ICD-10-CM | POA: Diagnosis not present

## 2024-01-11 DIAGNOSIS — I110 Hypertensive heart disease with heart failure: Secondary | ICD-10-CM | POA: Diagnosis not present

## 2024-01-11 DIAGNOSIS — Z7901 Long term (current) use of anticoagulants: Secondary | ICD-10-CM | POA: Diagnosis not present

## 2024-01-11 DIAGNOSIS — I509 Heart failure, unspecified: Secondary | ICD-10-CM | POA: Diagnosis not present

## 2024-01-11 DIAGNOSIS — K219 Gastro-esophageal reflux disease without esophagitis: Secondary | ICD-10-CM | POA: Diagnosis not present

## 2024-01-11 DIAGNOSIS — R32 Unspecified urinary incontinence: Secondary | ICD-10-CM | POA: Diagnosis not present

## 2024-01-11 DIAGNOSIS — R634 Abnormal weight loss: Secondary | ICD-10-CM | POA: Diagnosis not present

## 2024-01-12 DIAGNOSIS — I4891 Unspecified atrial fibrillation: Secondary | ICD-10-CM | POA: Diagnosis not present

## 2024-01-12 DIAGNOSIS — I509 Heart failure, unspecified: Secondary | ICD-10-CM | POA: Diagnosis not present

## 2024-01-12 DIAGNOSIS — K219 Gastro-esophageal reflux disease without esophagitis: Secondary | ICD-10-CM | POA: Diagnosis not present

## 2024-01-12 DIAGNOSIS — R634 Abnormal weight loss: Secondary | ICD-10-CM | POA: Diagnosis not present

## 2024-01-12 DIAGNOSIS — I110 Hypertensive heart disease with heart failure: Secondary | ICD-10-CM | POA: Diagnosis not present

## 2024-01-12 DIAGNOSIS — K449 Diaphragmatic hernia without obstruction or gangrene: Secondary | ICD-10-CM | POA: Diagnosis not present

## 2024-01-12 DIAGNOSIS — R269 Unspecified abnormalities of gait and mobility: Secondary | ICD-10-CM | POA: Diagnosis not present

## 2024-03-29 DEATH — deceased
